# Patient Record
Sex: Female | Born: 1950 | Race: White | Hispanic: No | Smoking: Never smoker
Health system: Southern US, Community
[De-identification: ages and names within clinical notes are randomized; demographics above are authoritative.]

## PROBLEM LIST (undated history)

## (undated) DIAGNOSIS — R519 Headache, unspecified: Secondary | ICD-10-CM

## (undated) DIAGNOSIS — D126 Benign neoplasm of colon, unspecified: Secondary | ICD-10-CM

## (undated) DIAGNOSIS — M1990 Unspecified osteoarthritis, unspecified site: Secondary | ICD-10-CM

## (undated) DIAGNOSIS — N200 Calculus of kidney: Secondary | ICD-10-CM

## (undated) DIAGNOSIS — I10 Essential (primary) hypertension: Secondary | ICD-10-CM

## (undated) DIAGNOSIS — I5032 Chronic diastolic (congestive) heart failure: Secondary | ICD-10-CM

## (undated) DIAGNOSIS — T8859XA Other complications of anesthesia, initial encounter: Secondary | ICD-10-CM

## (undated) DIAGNOSIS — R112 Nausea with vomiting, unspecified: Secondary | ICD-10-CM

## (undated) DIAGNOSIS — A419 Sepsis, unspecified organism: Secondary | ICD-10-CM

## (undated) DIAGNOSIS — J9621 Acute and chronic respiratory failure with hypoxia: Secondary | ICD-10-CM

## (undated) DIAGNOSIS — R51 Headache: Secondary | ICD-10-CM

## (undated) DIAGNOSIS — G8929 Other chronic pain: Secondary | ICD-10-CM

## (undated) DIAGNOSIS — I499 Cardiac arrhythmia, unspecified: Secondary | ICD-10-CM

## (undated) DIAGNOSIS — E785 Hyperlipidemia, unspecified: Secondary | ICD-10-CM

## (undated) DIAGNOSIS — N390 Urinary tract infection, site not specified: Secondary | ICD-10-CM

## (undated) DIAGNOSIS — T4145XA Adverse effect of unspecified anesthetic, initial encounter: Secondary | ICD-10-CM

## (undated) DIAGNOSIS — IMO0002 Reserved for concepts with insufficient information to code with codable children: Secondary | ICD-10-CM

## (undated) DIAGNOSIS — K589 Irritable bowel syndrome without diarrhea: Secondary | ICD-10-CM

## (undated) DIAGNOSIS — Z9889 Other specified postprocedural states: Secondary | ICD-10-CM

## (undated) DIAGNOSIS — K219 Gastro-esophageal reflux disease without esophagitis: Secondary | ICD-10-CM

## (undated) DIAGNOSIS — G4730 Sleep apnea, unspecified: Secondary | ICD-10-CM

## (undated) DIAGNOSIS — E119 Type 2 diabetes mellitus without complications: Secondary | ICD-10-CM

## (undated) DIAGNOSIS — R6520 Severe sepsis without septic shock: Secondary | ICD-10-CM

## (undated) DIAGNOSIS — G629 Polyneuropathy, unspecified: Secondary | ICD-10-CM

## (undated) DIAGNOSIS — U071 COVID-19: Secondary | ICD-10-CM

## (undated) DIAGNOSIS — K52832 Lymphocytic colitis: Secondary | ICD-10-CM

## (undated) DIAGNOSIS — K317 Polyp of stomach and duodenum: Secondary | ICD-10-CM

## (undated) DIAGNOSIS — I219 Acute myocardial infarction, unspecified: Secondary | ICD-10-CM

## (undated) HISTORY — DX: Reserved for concepts with insufficient information to code with codable children: IMO0002

## (undated) HISTORY — PX: ABDOMINAL HYSTERECTOMY: SHX81

## (undated) HISTORY — DX: Lymphocytic colitis: K52.832

## (undated) HISTORY — DX: Urinary tract infection, site not specified: N39.0

## (undated) HISTORY — DX: Headache: R51

## (undated) HISTORY — PX: ABDOMINAL HYSTERECTOMY: SUR658

## (undated) HISTORY — DX: Acute myocardial infarction, unspecified: I21.9

## (undated) HISTORY — DX: Gastro-esophageal reflux disease without esophagitis: K21.9

## (undated) HISTORY — PX: BACK SURGERY: SHX140

## (undated) HISTORY — DX: Polyp of stomach and duodenum: K31.7

## (undated) HISTORY — DX: Headache, unspecified: R51.9

## (undated) HISTORY — DX: Cardiac arrhythmia, unspecified: I49.9

## (undated) HISTORY — DX: Polyneuropathy, unspecified: G62.9

## (undated) HISTORY — DX: Essential (primary) hypertension: I10

## (undated) HISTORY — DX: Other chronic pain: G89.29

## (undated) HISTORY — DX: Sleep apnea, unspecified: G47.30

## (undated) HISTORY — DX: Type 2 diabetes mellitus without complications: E11.9

## (undated) HISTORY — DX: Unspecified osteoarthritis, unspecified site: M19.90

## (undated) HISTORY — DX: Benign neoplasm of colon, unspecified: D12.6

## (undated) HISTORY — DX: Irritable bowel syndrome, unspecified: K58.9

## (undated) HISTORY — DX: Hyperlipidemia, unspecified: E78.5

## (undated) HISTORY — DX: Calculus of kidney: N20.0

## (undated) HISTORY — PX: BLADDER REPAIR: SHX76

---

## 1998-02-04 ENCOUNTER — Ambulatory Visit (HOSPITAL_COMMUNITY): Admission: RE | Admit: 1998-02-04 | Discharge: 1998-02-04 | Payer: Self-pay | Admitting: Family Medicine

## 1998-07-04 HISTORY — PX: CHOLECYSTECTOMY: SHX55

## 1998-08-22 ENCOUNTER — Inpatient Hospital Stay (HOSPITAL_COMMUNITY): Admission: EM | Admit: 1998-08-22 | Discharge: 1998-08-23 | Payer: Self-pay | Admitting: Emergency Medicine

## 1998-08-22 ENCOUNTER — Encounter: Payer: Self-pay | Admitting: *Deleted

## 1999-02-12 ENCOUNTER — Encounter: Payer: Self-pay | Admitting: Family Medicine

## 1999-02-12 ENCOUNTER — Ambulatory Visit (HOSPITAL_COMMUNITY): Admission: RE | Admit: 1999-02-12 | Discharge: 1999-02-12 | Payer: Self-pay | Admitting: Family Medicine

## 1999-10-29 ENCOUNTER — Encounter: Payer: Self-pay | Admitting: Family Medicine

## 1999-10-29 ENCOUNTER — Encounter: Admission: RE | Admit: 1999-10-29 | Discharge: 1999-10-29 | Payer: Self-pay | Admitting: Family Medicine

## 1999-12-23 ENCOUNTER — Encounter: Admission: RE | Admit: 1999-12-23 | Discharge: 1999-12-23 | Payer: Self-pay | Admitting: Family Medicine

## 1999-12-23 ENCOUNTER — Encounter: Payer: Self-pay | Admitting: Family Medicine

## 2000-02-14 ENCOUNTER — Ambulatory Visit (HOSPITAL_COMMUNITY): Admission: RE | Admit: 2000-02-14 | Discharge: 2000-02-14 | Payer: Self-pay | Admitting: Family Medicine

## 2000-02-14 ENCOUNTER — Encounter: Payer: Self-pay | Admitting: Family Medicine

## 2001-05-07 ENCOUNTER — Encounter: Admission: RE | Admit: 2001-05-07 | Discharge: 2001-05-07 | Payer: Self-pay | Admitting: Family Medicine

## 2001-05-07 ENCOUNTER — Encounter: Payer: Self-pay | Admitting: Family Medicine

## 2001-10-01 ENCOUNTER — Encounter: Payer: Self-pay | Admitting: Family Medicine

## 2001-10-01 ENCOUNTER — Ambulatory Visit (HOSPITAL_COMMUNITY): Admission: RE | Admit: 2001-10-01 | Discharge: 2001-10-01 | Payer: Self-pay | Admitting: Family Medicine

## 2002-03-08 ENCOUNTER — Ambulatory Visit (HOSPITAL_COMMUNITY): Admission: RE | Admit: 2002-03-08 | Discharge: 2002-03-08 | Payer: Self-pay | Admitting: Family Medicine

## 2002-03-08 ENCOUNTER — Encounter: Payer: Self-pay | Admitting: Family Medicine

## 2002-03-25 ENCOUNTER — Encounter: Payer: Self-pay | Admitting: Family Medicine

## 2002-03-25 ENCOUNTER — Encounter: Admission: RE | Admit: 2002-03-25 | Discharge: 2002-03-25 | Payer: Self-pay | Admitting: Family Medicine

## 2002-04-09 ENCOUNTER — Encounter: Payer: Self-pay | Admitting: Family Medicine

## 2002-04-09 ENCOUNTER — Encounter: Admission: RE | Admit: 2002-04-09 | Discharge: 2002-04-09 | Payer: Self-pay | Admitting: Family Medicine

## 2002-10-28 ENCOUNTER — Ambulatory Visit (HOSPITAL_COMMUNITY): Admission: RE | Admit: 2002-10-28 | Discharge: 2002-10-28 | Payer: Self-pay | Admitting: Family Medicine

## 2002-10-28 ENCOUNTER — Encounter: Payer: Self-pay | Admitting: Family Medicine

## 2003-02-28 ENCOUNTER — Ambulatory Visit (HOSPITAL_COMMUNITY): Admission: RE | Admit: 2003-02-28 | Discharge: 2003-02-28 | Payer: Self-pay | Admitting: Family Medicine

## 2003-02-28 ENCOUNTER — Encounter: Payer: Self-pay | Admitting: Family Medicine

## 2003-04-09 ENCOUNTER — Encounter: Admission: RE | Admit: 2003-04-09 | Discharge: 2003-04-09 | Payer: Self-pay | Admitting: Neurosurgery

## 2003-04-09 ENCOUNTER — Encounter: Payer: Self-pay | Admitting: Neurosurgery

## 2003-06-26 ENCOUNTER — Encounter: Admission: RE | Admit: 2003-06-26 | Discharge: 2003-09-24 | Payer: Self-pay | Admitting: Family Medicine

## 2003-09-23 ENCOUNTER — Encounter: Admission: RE | Admit: 2003-09-23 | Discharge: 2003-12-22 | Payer: Self-pay | Admitting: Family Medicine

## 2003-10-01 ENCOUNTER — Encounter: Admission: RE | Admit: 2003-10-01 | Discharge: 2003-10-01 | Payer: Self-pay | Admitting: Family Medicine

## 2003-10-20 ENCOUNTER — Encounter: Admission: RE | Admit: 2003-10-20 | Discharge: 2003-10-20 | Payer: Self-pay | Admitting: Family Medicine

## 2003-11-25 ENCOUNTER — Encounter: Admission: RE | Admit: 2003-11-25 | Discharge: 2003-11-25 | Payer: Self-pay | Admitting: Otolaryngology

## 2004-02-04 ENCOUNTER — Other Ambulatory Visit: Admission: RE | Admit: 2004-02-04 | Discharge: 2004-02-04 | Payer: Self-pay | Admitting: Obstetrics and Gynecology

## 2004-07-04 DIAGNOSIS — D126 Benign neoplasm of colon, unspecified: Secondary | ICD-10-CM

## 2004-07-04 HISTORY — DX: Benign neoplasm of colon, unspecified: D12.6

## 2004-10-22 ENCOUNTER — Ambulatory Visit: Payer: Self-pay | Admitting: Internal Medicine

## 2004-11-10 ENCOUNTER — Ambulatory Visit: Payer: Self-pay | Admitting: Internal Medicine

## 2005-03-08 ENCOUNTER — Other Ambulatory Visit: Admission: RE | Admit: 2005-03-08 | Discharge: 2005-03-08 | Payer: Self-pay | Admitting: Obstetrics and Gynecology

## 2005-06-07 ENCOUNTER — Encounter: Admission: RE | Admit: 2005-06-07 | Discharge: 2005-06-07 | Payer: Self-pay | Admitting: Family Medicine

## 2006-07-07 ENCOUNTER — Encounter: Admission: RE | Admit: 2006-07-07 | Discharge: 2006-07-07 | Payer: Self-pay | Admitting: Family Medicine

## 2007-02-05 ENCOUNTER — Emergency Department (HOSPITAL_COMMUNITY): Admission: EM | Admit: 2007-02-05 | Discharge: 2007-02-06 | Payer: Self-pay | Admitting: Emergency Medicine

## 2007-02-20 ENCOUNTER — Encounter: Admission: RE | Admit: 2007-02-20 | Discharge: 2007-02-20 | Payer: Self-pay | Admitting: Family Medicine

## 2007-07-16 ENCOUNTER — Encounter: Admission: RE | Admit: 2007-07-16 | Discharge: 2007-07-16 | Payer: Self-pay | Admitting: Dermatology

## 2008-04-03 HISTORY — PX: CARDIAC CATHETERIZATION: SHX172

## 2008-04-07 ENCOUNTER — Encounter: Admission: RE | Admit: 2008-04-07 | Discharge: 2008-04-28 | Payer: Self-pay | Admitting: Internal Medicine

## 2008-04-19 ENCOUNTER — Inpatient Hospital Stay (HOSPITAL_COMMUNITY): Admission: EM | Admit: 2008-04-19 | Discharge: 2008-04-22 | Payer: Self-pay | Admitting: Emergency Medicine

## 2008-09-08 ENCOUNTER — Encounter: Admission: RE | Admit: 2008-09-08 | Discharge: 2008-09-29 | Payer: Self-pay | Admitting: Neurology

## 2008-11-05 ENCOUNTER — Emergency Department (HOSPITAL_COMMUNITY): Admission: EM | Admit: 2008-11-05 | Discharge: 2008-11-05 | Payer: Self-pay | Admitting: Emergency Medicine

## 2008-11-19 ENCOUNTER — Encounter: Admission: RE | Admit: 2008-11-19 | Discharge: 2008-11-19 | Payer: Self-pay | Admitting: Neurology

## 2009-01-28 ENCOUNTER — Emergency Department (HOSPITAL_COMMUNITY): Admission: EM | Admit: 2009-01-28 | Discharge: 2009-01-28 | Payer: Self-pay | Admitting: Emergency Medicine

## 2009-06-14 IMAGING — CR DG CHEST 2V
2 series · 2 of 2 positions shown · non-contrast
Comparison: 07/16/2007

CLINICAL DATA: Chest pain

CHEST - 2 VIEW

[w chest pa]
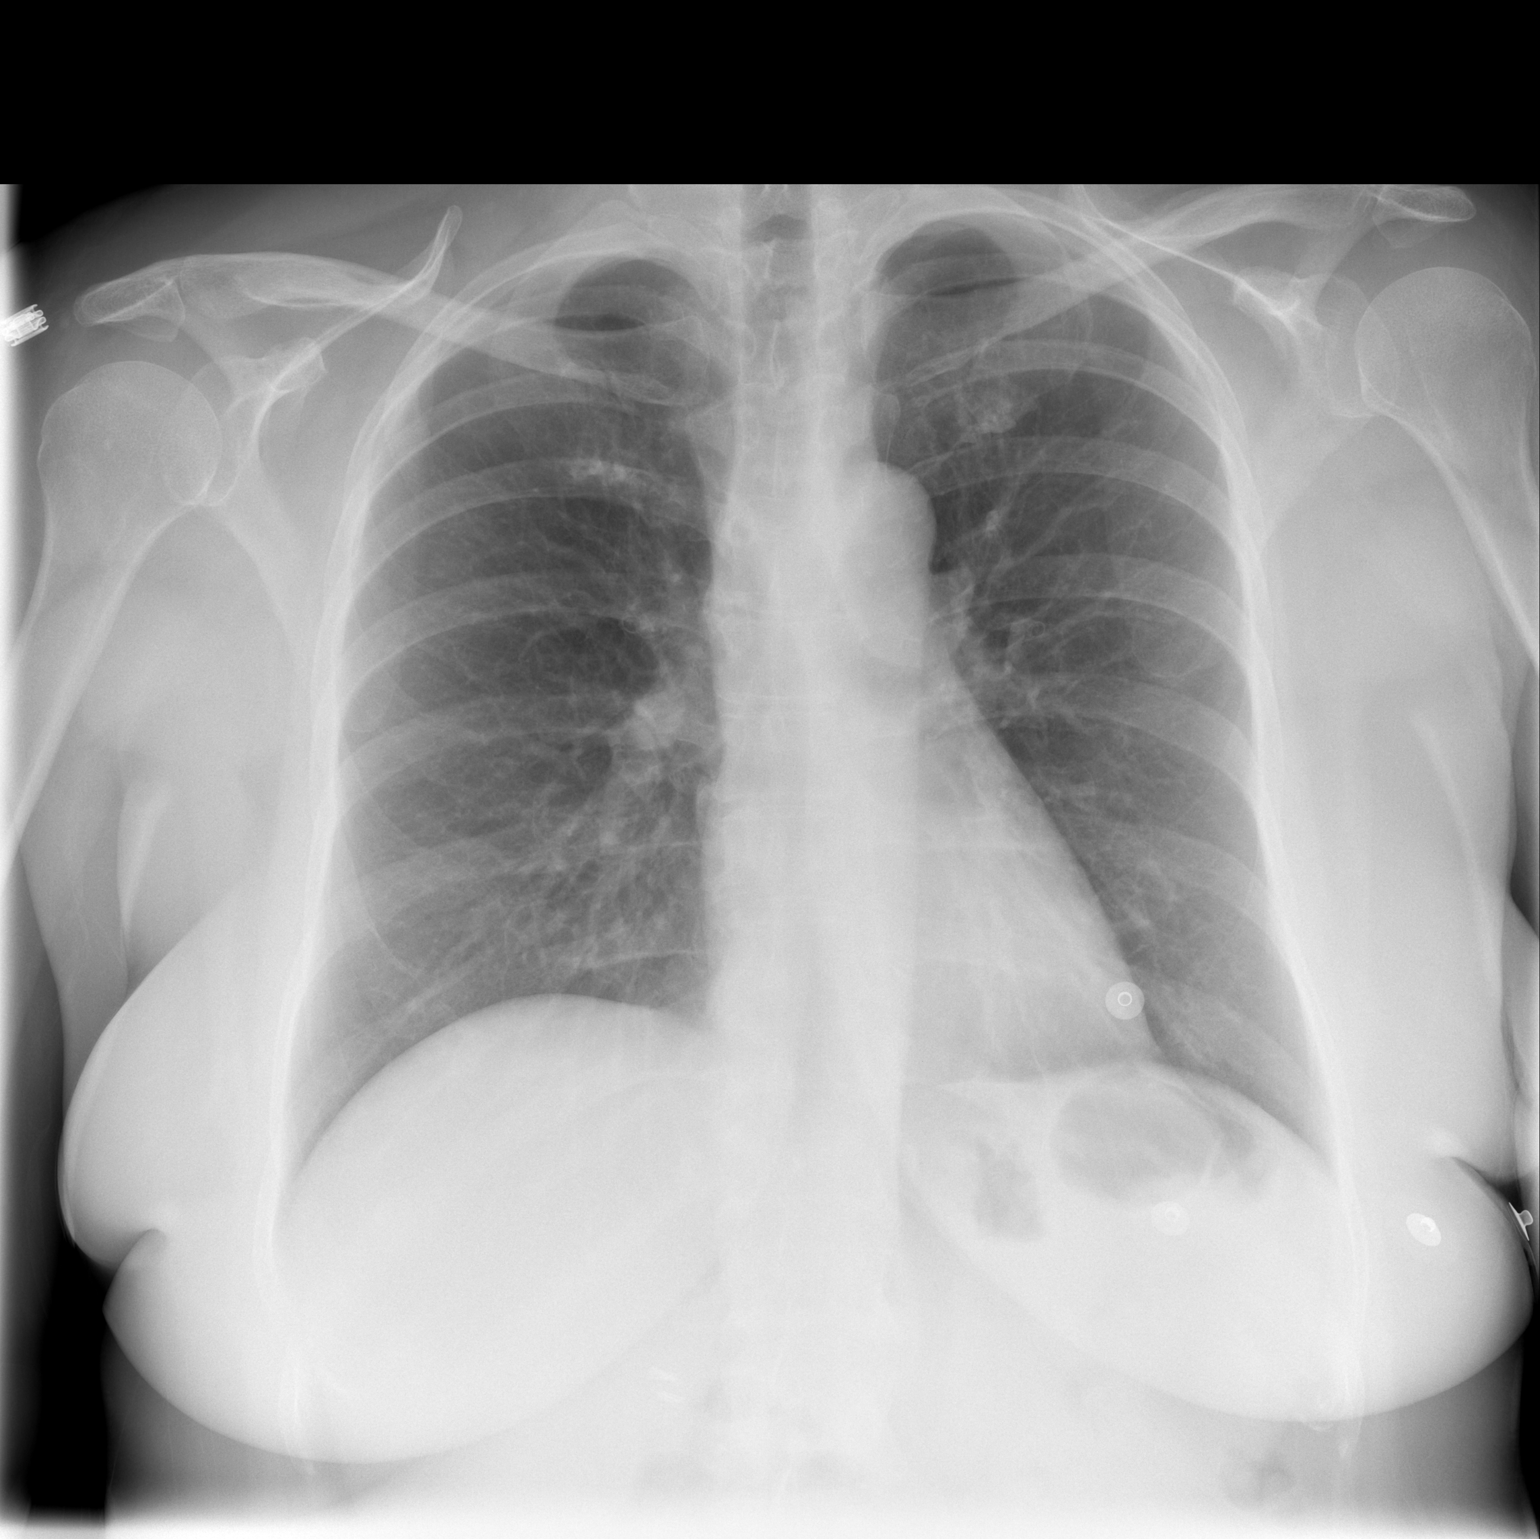

[w chest lat]
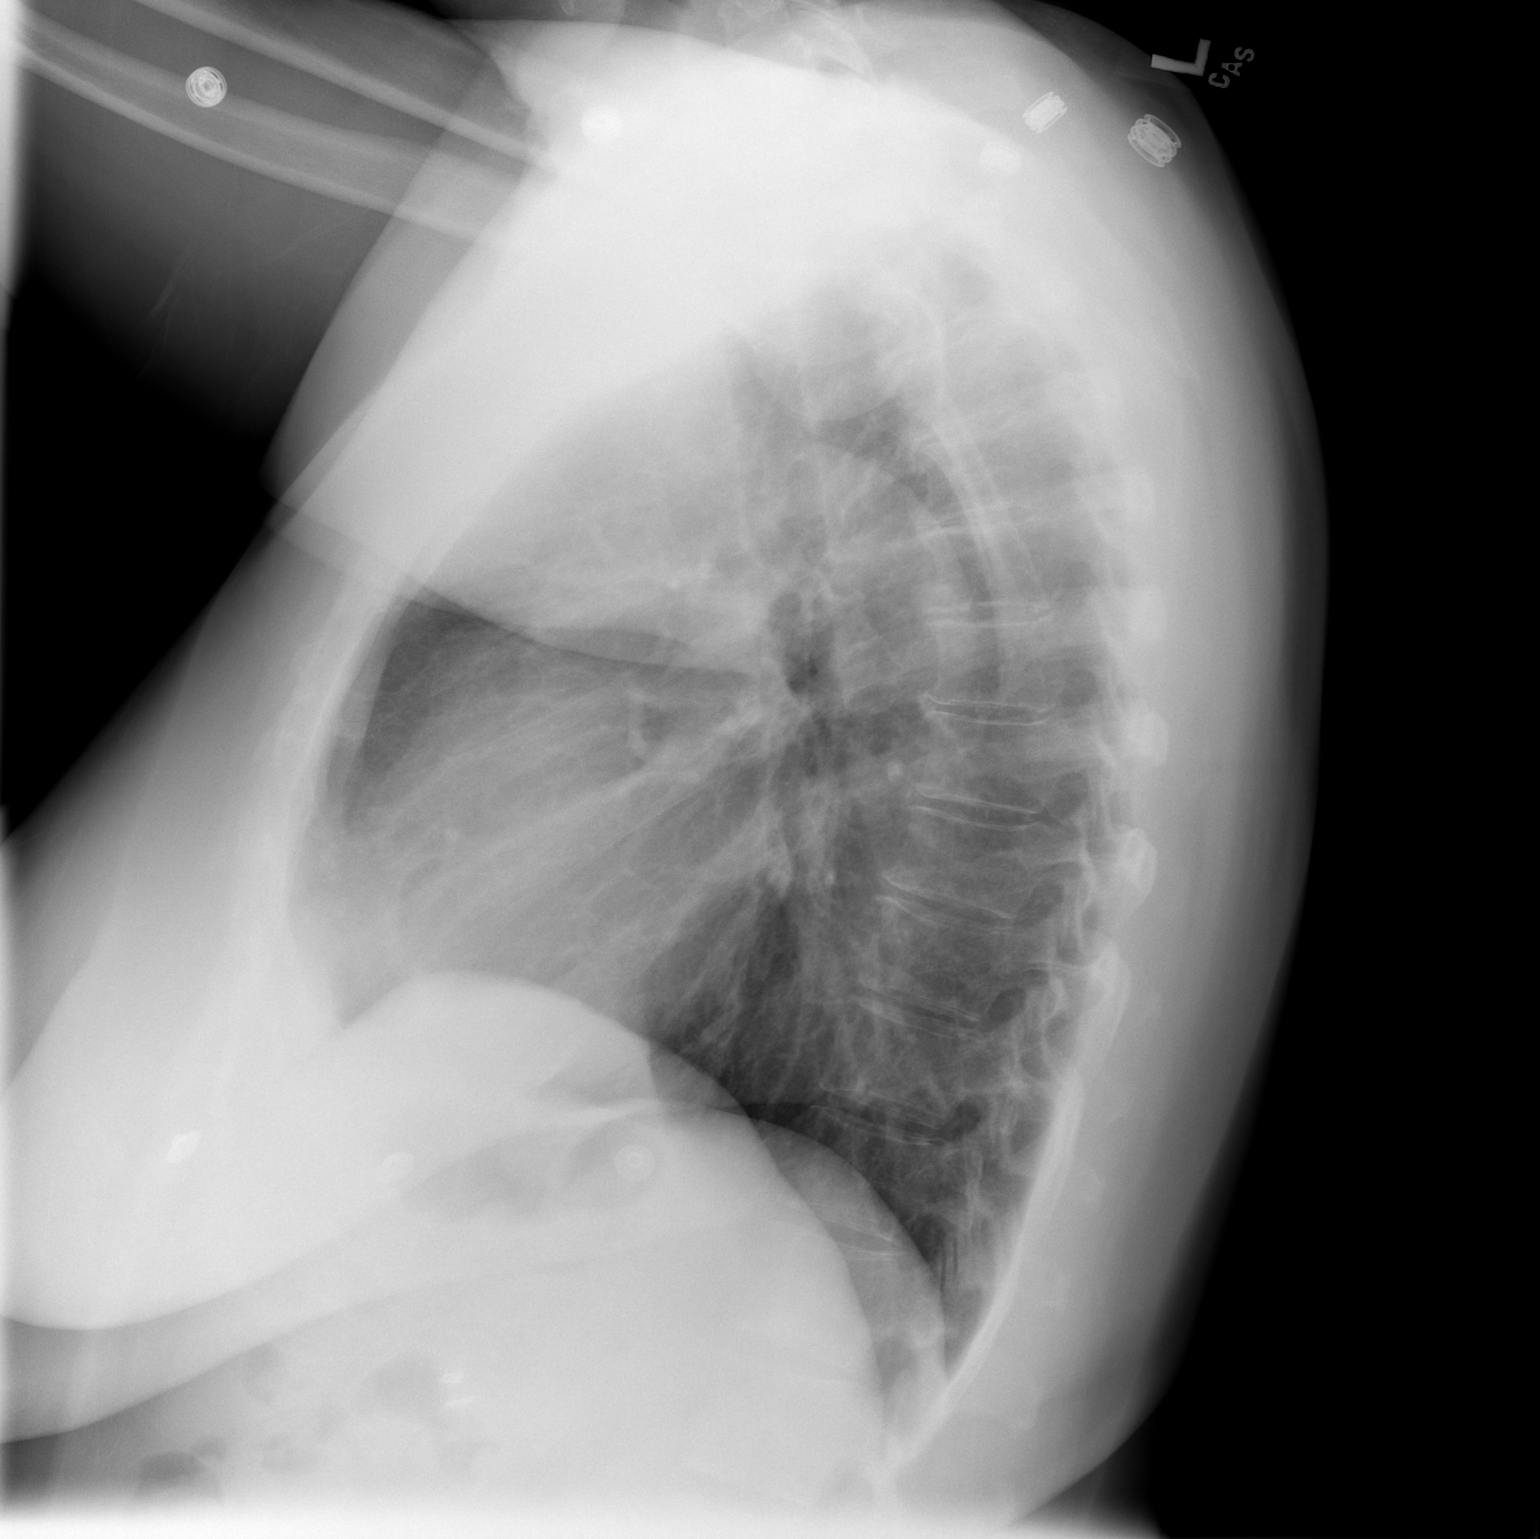

[2 of 2 positions shown; findings below may reference images not displayed]

FINDINGS: The cardiomediastinal silhouette is unremarkable.
The lungs are clear.
There is no evidence of focal airspace disease, pleural effusions,
or pneumothorax.
No acute bony abnormalities are identified.
IMPRESSION: No evidence of acute cardiopulmonary disease.

## 2009-08-30 ENCOUNTER — Ambulatory Visit (HOSPITAL_COMMUNITY): Admission: RE | Admit: 2009-08-30 | Discharge: 2009-08-30 | Payer: Self-pay | Admitting: Internal Medicine

## 2009-10-13 ENCOUNTER — Encounter (INDEPENDENT_AMBULATORY_CARE_PROVIDER_SITE_OTHER): Payer: Self-pay | Admitting: *Deleted

## 2010-04-13 ENCOUNTER — Ambulatory Visit (HOSPITAL_COMMUNITY): Admission: RE | Admit: 2010-04-13 | Discharge: 2010-04-13 | Payer: Self-pay | Admitting: Internal Medicine

## 2010-08-03 NOTE — Letter (Signed)
Summary: Colonoscopy Letter  [facility]  [address] [place], [place] [address]   Phone: [phone]  Fax: [phone]      October 13, 2009 MRN: [id number]   [facility] [address] [place], [place]  [address]   Dear [name],   According to your medical record, it is time for you to schedule a Colonoscopy. The American Cancer Society recommends this procedure as a method to detect early colon cancer. Patients with a family history of colon cancer, or a personal history of colon polyps or inflammatory bowel disease are at increased risk.  This letter has beeen generated based on the recommendations made at the time of your procedure. If you feel that in your particular situation this may no longer apply, please contact our office.  Please call our office at [phone] to schedule this appointment or to update your records at your earliest convenience.  Thank you for cooperating with [place] to provide you with the very best care possible.   Sincerely,  Hedwig Morton. Juanda Chance, M.D  [facility] [phone]

## 2010-08-11 ENCOUNTER — Emergency Department (HOSPITAL_COMMUNITY): Payer: MEDICARE

## 2010-08-11 ENCOUNTER — Observation Stay (HOSPITAL_COMMUNITY)
Admission: EM | Admit: 2010-08-11 | Discharge: 2010-08-12 | Disposition: A | Discharge status: Home / Self Care | Payer: MEDICARE | Attending: Internal Medicine | Admitting: Internal Medicine

## 2010-08-11 DIAGNOSIS — G609 Hereditary and idiopathic neuropathy, unspecified: Secondary | ICD-10-CM | POA: Insufficient documentation

## 2010-08-11 DIAGNOSIS — R51 Headache: Secondary | ICD-10-CM | POA: Insufficient documentation

## 2010-08-11 DIAGNOSIS — E119 Type 2 diabetes mellitus without complications: Secondary | ICD-10-CM | POA: Insufficient documentation

## 2010-08-11 DIAGNOSIS — I10 Essential (primary) hypertension: Secondary | ICD-10-CM | POA: Insufficient documentation

## 2010-08-11 DIAGNOSIS — K219 Gastro-esophageal reflux disease without esophagitis: Secondary | ICD-10-CM | POA: Insufficient documentation

## 2010-08-11 DIAGNOSIS — H538 Other visual disturbances: Secondary | ICD-10-CM | POA: Insufficient documentation

## 2010-08-11 DIAGNOSIS — R079 Chest pain, unspecified: Principal | ICD-10-CM | POA: Insufficient documentation

## 2010-08-11 DIAGNOSIS — E785 Hyperlipidemia, unspecified: Secondary | ICD-10-CM | POA: Insufficient documentation

## 2010-08-11 DIAGNOSIS — E860 Dehydration: Secondary | ICD-10-CM | POA: Insufficient documentation

## 2010-08-11 DIAGNOSIS — R42 Dizziness and giddiness: Secondary | ICD-10-CM | POA: Insufficient documentation

## 2010-08-11 LAB — DIFFERENTIAL
Eosinophils Absolute: 0.2 10*3/uL (ref 0.0–0.7)
Eosinophils Relative: 2 % (ref 0–5)
Lymphs Abs: 2.3 10*3/uL (ref 0.7–4.0)
Monocytes Relative: 6 % (ref 3–12)

## 2010-08-11 LAB — POCT CARDIAC MARKERS
CKMB, poc: <1 ng/mL — ABNORMAL LOW (ref 1.0–8.0)
Troponin i, poc: <0.05 ng/mL (ref 0.00–0.09)

## 2010-08-11 LAB — POCT I-STAT, CHEM 8
Chloride: 108 mEq/L (ref 96–112)
Creatinine, Ser: 0.8 mg/dL (ref 0.4–1.2)
Hemoglobin: 15 g/dL (ref 12.0–15.0)
Potassium: 3.6 mEq/L (ref 3.5–5.1)
Sodium: 142 mEq/L (ref 135–145)

## 2010-08-11 LAB — CBC
HCT: 42.6 % (ref 36.0–46.0)
MCH: 28.8 pg (ref 26.0–34.0)
MCHC: 34 g/dL (ref 30.0–36.0)
MCV: 84.5 fL (ref 78.0–100.0)
RDW: 14.4 % (ref 11.5–15.5)

## 2010-08-11 LAB — PROTIME-INR: INR: 0.97 (ref 0.00–1.49)

## 2010-08-11 LAB — GLUCOSE, CAPILLARY: Glucose-Capillary: 137 mg/dL — ABNORMAL HIGH (ref 70–99)

## 2010-08-12 LAB — CARDIAC PANEL(CRET KIN+CKTOT+MB+TROPI)
CK, MB: 0.9 ng/mL (ref 0.3–4.0)
Relative Index: INVALID (ref 0.0–2.5)
Relative Index: INVALID (ref 0.0–2.5)
Troponin I: <0.01 ng/mL (ref 0.00–0.06)
Troponin I: 0.01 ng/mL (ref 0.00–0.06)

## 2010-08-12 LAB — BASIC METABOLIC PANEL
BUN: 17 mg/dL (ref 6–23)
CO2: 25 mEq/L (ref 19–32)
Calcium: 8.9 mg/dL (ref 8.4–10.5)
Chloride: 111 mEq/L (ref 96–112)
Creatinine, Ser: 0.68 mg/dL (ref 0.4–1.2)
GFR calc Af Amer: >60 mL/min (ref >60)

## 2010-08-12 LAB — GLUCOSE, CAPILLARY: Glucose-Capillary: 88 mg/dL (ref 70–99)

## 2010-08-15 NOTE — Discharge Summary (Signed)
[name], [name]               ACCOUNT NO.:  [id number]  MEDICAL RECORD NO.:  [id number]           PATIENT TYPE:  I  LOCATION:  3701                         FACILITY:  [facility]  PHYSICIAN:  Andreas Blower, MD       DATE OF BIRTH:  Apr 10, 1951  DATE OF ADMISSION:  08/11/2010 DATE OF DISCHARGE:  08/12/2010                              DISCHARGE SUMMARY   PATIENT'S PRIMARY CARE PHYSICIAN:  At [facility].  PATIENT'S CARDIOLOGIST:  Sharrie Rothman, MD, PhD at [facility].  PATIENT'S NEUROLOGIST:  Melvyn Novas, MD  DISCHARGE DIAGNOSES: 1. Chest pain most likely secondary to musculoskeletal on chest pain     in nature. 2. Dizziness, etiology unclear maybe due to benign paroxysmal     positional vertigo. 3. Recurrent episodes of blurry vision, maybe due to syncope versus     presyncope versus questionable migraines. 4. Hypertension. 5. Hyperlipidemia. 6. Peripheral neuropathy. 7. Significant history of diabetes diet controlled. 8. Status post low back surgery for degenerative joint disease. 9. Status post cholecystectomy. 10.Status post 2 bladder tack surgery, status post hysterectomy. 11.Gastroesophageal reflux disease.  DISCHARGE MEDICATIONS: 1. Omeprazole 20 mg p.o. twice daily. 2. Aspirin 81 mg p.o. daily. 3. __________ 25 mg daily at bedtime. 4. Rosuvastatin 10 mg p.o. daily at bedtime. 5. Fish oil 1 g 2 capsules p.o. q.a.m. 6. Gabapentin 200 mg p.o. 3 times a day. 7. Hydrocodone/APAP 100/325 one tablet p.o. 3 times a day as needed. 8. Multivitamin 1 tablet p.o. q.a.m. 9. Vitamin D3 over-the-counter 1 tablet p.o. daily. 10.Xanax 1 mg p.o. daily at bedtime.  BRIEF ADMITTING HISTORY AND PHYSICAL:  [name] is a 60 year old Caucasian female who presented with a chest discomfort and questionable syncopal episode yesterday.  RADIOLOGY/IMAGING: 1. The patient had portable chest x-ray which shows no acute     cardiopulmonary findings. 2. The patient  had a CT of the head and C-spine which shows no acute     intracranial abnormality or significant interval change. 3. Minimal sphenoid sinus disease.  Postoperative changes of the     sinus.  No acute abnormality of the cervical spine.  Focal left-     sided facet degenerative changes at C5 through C6.  LABORATORY DATA:  CBC shows a white count of 9.5, hemoglobin 15.0, hematocrit 44.0, platelet count 203.  Electrolytes normal with a creatinine of 0.68.  Troponin's negative x3.  HOSPITAL COURSE: 1. Chest discomfort most likely due to musculoskeletal pain.  Reports     that she has been doing some light weight lifting and has had noted     chest pain since then.  Her pain is reproducible on palpation on     her chest. 2. Questionable syncopal episode.  The patient was not orthostatic on     admission.  She appeared adequately hydrated.  The patient does     complain about dizziness, reports that she gets dizzy whenever she     turns her head, uncertain if she has BPPV that could be     contributing to her symptoms.  I offered the patient to get a 2-D  echocardiogram while the patient was inpatient.  However, the     patient declined and says that she will follow up with her     cardiologist to get a 2-D echocardiogram.  The patient did have a 2-     D echocardiogram done on April 13, 2010, which showed her     systolic function was normal and wall motion was normal at that     time.  The patient has had history of BPPV and has had outpatient     physical therapy in the past which have not been helpful.  As a     result, we will give her a self-treatment exercises for BPPV and     instructed her to follow up with her primary care physician. 3. Recurrent headaches, maybe due to migraine headache.  Resolved     during the course of the hospital stay. 4. Hypertension.  The patient's blood pressure has been borderline low     at 104/69.  As a result, her lisinopril dose was decreased as  she     was instructed to follow with her primary care physician, at which     time, she can have further titration of her antihypertensives at     that time. 5. Hyperlipidemia, stable. 6. Diet-controlled diabetes, stable.  DISPOSITION AND FOLLOWUP:  The patient to follow up with her primary care physician in 1-week.  The patient was instructed not to drive at least for 6 months, but we will defer to her primary care physician. Also, will defer to her primary care physicians for management of her antihypertensive medications.  The patient was also instructed to follow up with Dr. Konrad Felix her cardiologist and to have a 2-D echocardiogram.  Time spent on discharge talking to the patient, coordinating care was 40 minutes.   Andreas Blower, MD    SR/MEDQ  D:  08/12/2010  T:  08/13/2010  Job:  [id number]  Electronically Signed by Wardell Heath Danialle Dement  on 08/13/2010 08:01:41 PM

## 2010-09-05 NOTE — H&P (Signed)
[name], [name]               ACCOUNT NO.:  [id number]  MEDICAL RECORD NO.:  [id number]           PATIENT TYPE:  E  LOCATION:  [facility]                         FACILITY:  [facility]  PHYSICIAN:  Vania Rea, M.D. DATE OF BIRTH:  27-May-1951  DATE OF ADMISSION:  08/11/2010 DATE OF DISCHARGE:                             HISTORY & PHYSICAL   PRIMARY CARE PHYSICIAN:  [facility].  CARDIOLOGIST:  Dr. Elwin Mocha at [facility].  NEUROLOGIST:  Melvyn Novas, MD  CHIEF COMPLAINT:  Chest pain and syncopal episode.  HISTORY OF PRESENT ILLNESS:  This is a 60 year old Caucasian lady who presents with a history of substernal heaviness and discomfort, which started yesterday, worse today and was associated with syncopal episode today.  This history was sometimes varied and was sometimes unclear and the patient says she sometimes gets confused and her memory is not quite right when she is talking to somebody, but when she is by herself she remembers things more easily.  The patient initially gave a history of having previously had myocardial infarctions and that she has prescribed sublingual nitroglycerin, but she takes when necessary for chest pain. She however says nitroglycerin does not relieve the chest pain.  When it was pointed out to her that the review of her records reveal that she had cardiac catheterization in 2009, it showed no evidence of an acute myocardial infarction, nor any evidence of coronary artery disease.  The patient agreed that she did not have an acute MI at that time.  The patient does however, says she was plagued with chest pain, she further says she was in the area visiting with family and then because of the chest pain, she decided to drive herself to the Emergency Room to be evaluated.  She later however, said the pain actually started yesterday and that today, the chest pain did not restart until after she left her family members  house and was on her way home.  She says as she approached a stop light, she had a sensation that she cannot describe that made her know that she was about to pass out because she has had these spells before.  She pulled over by the side of the road of a stop light and does not remember anything until the EMS arrived and was taking out of the car.  She says she refused to have herself transported by EMS because she was not going to pay them the 500 dollars and by this time, also her sister had driven up behind her.  She insisted on driving herself to the [facility] Emergency Room and she presented to the [facility] Emergency Room having driven herself here.  She believes her loss of consciousness was only for a few minutes.  It was not associated with any palpitations or any evidence of jerking movements that she is able to know.  No biting of her tongue and no incontinence of urine.  She reports that the chest pain when it is there is a 10 out of 10 and that there are no aggravating factors.  She reports that when she is  sitting up, she feels dizzy and when she is having the chest pain, it also makes her feel dizzy.  It is also associated with sweating and nausea.  No vomiting.  No diarrhea.  It is of note that this patient drives of course in the sitting position.  Her husband reports that she has been having these spells very frequently of strange-feeling dizziness and then passing out.  The symptoms are associated with headache predominantly on the left side of her head and with blurred vision or flashing lights.  She says she has never discussed this with her neurologist.  The pain also radiates up to both shoulders, but predominantly the left shoulder down into the left hand, up into her neck, and into her head and gives a steady headache. She reports that, although this is very frequent, she has never discussed it in detail with her primary care physician.  The patient says she  has been diagnosed with diabetes, but does not take any medication.  Her hemoglobin A1c is usually 5.5.  She says she has lost weight.  PAST MEDICAL HISTORY: 1. Hypertension. 2. Diabetes controlled on diet. 3. Hyperlipidemia, recurrent problems as noted above. 4. She is status post lower back surgery for degenerative joint     disease. 5. She is status post cholecystectomy. 6. She is status post two bladder tuck status post hysterectomy. 7. Also has a history of GERD.  MEDICATIONS: 1. Omeprazole 20 mg daily. 2. Vitamin D3 over-the-counter 1 daily. 3. Fish oil daily. 4. Multivitamins daily. 5. Aspirin 81 mg daily. 6. Lisinopril 10 mg daily. 7. Gabapentin 300 mg twice daily. 8. Crestor 10 mg at bedtime. 9. Xanax 1 mg daily when necessary.  Says she rarely takes it. 10.Hydrocodone 10/325 one tablet 3 times a day when necessary for     pain.  Says she rarely takes it. 11.Atenolol 25 mg at bedtime.  ALLERGIES:  SHELLFISH, PENICILLIN, and SULFA all of which cause swelling.  SOCIAL HISTORY:  She denies any tobacco, alcohol, or illicit drug use. She is on disability because of her back issues.  She is married and her husband is present with her on interview.  FAMILY HISTORY:  She has no family history of coronary artery disease, but she says both parents and all her siblings have hypertension.  No family history of diabetes.  REVIEW OF SYSTEMS:  Other than noted above, significant only for a history of shingles and a history of neuropathy in her left foot, which she says as an undiagnosed etiology.  Additionally, she reports that she does not sleep very well.  She wakes up frequently about every hour at bedtime.  Does not find herself thinking about anything in particular.  PHYSICAL EXAMINATION:  GENERAL:  Pleasant elderly Caucasian lady lying in the stretcher in no acute physical distress noted. VITAL SIGNS:  Temperature is 98.3, pulse 69, respirations 19, blood pressure  93/43, and she is saturating 100% on room air. HEENT:  Her pupils are round, equal, and reactive.  Mucous membranes pink.  Anicteric.  She does not appear to be dehydrated. NECK:  No cervical lymphadenopathy or thyromegaly.  She does have a thick neck.  No carotid bruit. CHEST:  Clear to auscultation bilaterally. CARDIOVASCULAR:  Regular rhythm.  No murmur. ABDOMEN:  Massively obese.  Soft.  Nontender.  She has no rash in the intertriginous folds. EXTREMITIES:  Calves well developed.  No edema.  No appreciated arthritic deformities. CENTRAL NERVOUS SYSTEM:  Cranial nerves II through XII  are grossly intact.  No lateralizing signs noted.  LABORATORY DATA:  Her white count is 9.5, hemoglobin 14.5, and platelets 203.  She has a normal differential.  Sodium is 142, potassium 3.6, chloride 108, glucose 109, BUN 20, and creatinine 0.8.  Cardiac markers are completely normal with undetectable CK-MB and troponin and myoglobin of 51.  Her INR is 0.97.  Her PT is 13.  Her PTT is 26.  IMAGING DATA:  Chest x-ray shows no acute cardiopulmonary findings.  Her EKG has been reported as showing normal sinus rhythm with no ST-segment elevation.  ASSESSMENT: 1. Recurrent episodes of chest discomfort associated with shoulder and     head pains of unclear etiology. 2. Recurrent headaches, blurred vision, and syncope versus presyncopal     episodes, questionable migraine, questionable complex partial     seizures. 3. Hypertension. 4. Hyperlipidemia. 5. Peripheral neuropathy. 6. Dehydration as evidenced by elevated BUN and creatinine ratio. 7. No evidence of orthostasis.  PLAN:  We will admit this lady for gentle hydration and serial cardiac enzymes.  Because of her symptoms, we will go ahead and get CT scan of her head and her neck to rule out degenerative joint disease.  This lady may benefit from outpatient followup with her neurologist.  Other plans as per orders.     Vania Rea,  M.D.   ______________________________ Vania Rea, M.D.    LC/MEDQ  D:  08/11/2010  T:  08/11/2010  Job:  [id number]  cc:   Melvyn Novas, M.D. [name]  Electronically Signed by Vania Rea M.D. on 09/05/2010 10:50:52 PM

## 2010-10-10 LAB — CBC
HCT: 41 % (ref 36.0–46.0)
MCHC: 34 g/dL (ref 30.0–36.0)
MCV: 84.5 fL (ref 78.0–100.0)
Platelets: 195 10*3/uL (ref 150–400)
RDW: 13.6 % (ref 11.5–15.5)
WBC: 6.6 10*3/uL (ref 4.0–10.5)

## 2010-10-10 LAB — BASIC METABOLIC PANEL
BUN: 16 mg/dL (ref 6–23)
CO2: 28 mEq/L (ref 19–32)
Chloride: 106 mEq/L (ref 96–112)
Creatinine, Ser: 0.57 mg/dL (ref 0.4–1.2)
Glucose, Bld: 120 mg/dL — ABNORMAL HIGH (ref 70–99)
Potassium: 4.1 mEq/L (ref 3.5–5.1)

## 2010-10-10 LAB — POCT CARDIAC MARKERS: Troponin i, poc: <0.05 ng/mL (ref 0.00–0.09)

## 2010-10-12 LAB — GLUCOSE, CAPILLARY: Glucose-Capillary: 132 mg/dL — ABNORMAL HIGH (ref 70–99)

## 2010-11-16 NOTE — Cardiovascular Report (Signed)
NAMEAAHANA, ELZA               ACCOUNT NO.:  [id number]   MEDICAL RECORD NO.:  [id number]         PATIENT TYPE:  CINP   LOCATION:                               FACILITY:  [facility]   PHYSICIAN:  Nicki Guadalajara, M.D.     DATE OF BIRTH:  May 28, 1951   DATE OF PROCEDURE:  DATE OF DISCHARGE:                            CARDIAC CATHETERIZATION   INDICATIONS:  [name] is a 60 year old patient of Dr. Duaine Dredge  and recently had a stress study done by Dr. Shelva Majestic.  This showed a  small area of apical and inferolateral scar with suggestion of mild peri-  infarction ischemia and ejection fraction of 48%.  The patient has been  experiencing some episodes of chest pain, as well as some sharp,  stabbing interscapular back discomfort, somewhat atypical.  She was  admitted to [facility] on April 19, 2008.  Cardiac enzymes  have been negative.  Definitive cardiac catheterization was recommended.   PROCEDURE:  After premedication with Versed 2 mg intravenously, the  patient was prepped and draped in the usual fashion.  Her right femoral  artery was punctured anteriorly, and a 5-French sheath was inserted  without difficulty.  Diagnostic catheterization was done utilizing 5-  [place] Judkins 4 left and right coronary catheters.  A 5-French pigtail  catheter was used for biplane cine left ventriculography, as well as  distal aortography.  Hemostasis was obtained by direct manual pressure.   HEMODYNAMIC DATA:  Central aortic pressure was 148/92.  Left ventricular  pressure 148/18.   ANGIOGRAPHIC DATA:  Left main coronary artery was angiographically  normal and bifurcated into an LAD and left circumflex system.   The LAD gave rise to 2 diagonal vessels, several septal perforating  arteries, and wrapped around the LV apex.  There was smooth 20-30%  narrowing in the LAD in the region of the first diagonal takeoff.   The circumflex vessel was moderate-sized vessel that gave rise to 1  major marginal vessel and was free of significant disease.   The right coronary artery was moderate-sized vessel and had mild 20%  narrowing after an acute marginal branch prior to the crux.   Biplane cine left ventriculography revealed normal LV contractility.  However, there was a small focal area of distal inferior  hypocontractility on the RAO projection and on the LAO projection, there  was a very focal area of apical inferolateral hypocontractility, which  appeared like a nubbin from the apical inferolateral segment.   The overall ejection fraction was 50-55%.   Distal aortography demonstrated widely patent renal arteries  bilaterally.  There was no evidence for any aortic aneurysm.  The iliacs  were not optimally visualized.   IMPRESSION:  1. Low normal left ventricular contractility with focal area of      hypocontractility involving the distal apical inferior wall on the      right anterior oblique projection and involving the apical      inferolateral wall on the left anterior oblique projection.  2. Mild coronary obstructive disease with smooth 20-30% narrowing in      the left  anterior descending in the region of the first diagonal      takeoff, as well as mild smooth 20% narrowing in the right coronary      artery.   RECOMMENDATIONS:  Medical therapy:  The patient does have a wall motion  abnormality, which does raise the possibility of a prior vasospasm  contributing to this defect.  This defect is consistent with the  abnormality seen on a nuclear stress test.  Medical therapy will be  recommended.           ______________________________  Nicki Guadalajara, M.D.     TK/MEDQ  D:  04/21/2008  T:  04/22/2008  Job:  [id number]   cc:   Mosetta Putt, M.D.  Macarthur Critchley Shelva Majestic, M.D.

## 2010-11-19 NOTE — Discharge Summary (Signed)
[name], [name]               ACCOUNT NO.:  [id number]   MEDICAL RECORD NO.:  [id number]          PATIENT TYPE:  INP   LOCATION:  3731                         FACILITY:  [facility]   PHYSICIAN:  Dani Gobble, MD       DATE OF BIRTH:  Oct 25, 1950   DATE OF ADMISSION:  04/18/2008  DATE OF DISCHARGE:  04/22/2008                               DISCHARGE SUMMARY   DISCHARGE DIAGNOSES:  1. Chest pain worrisome for unstable angina, myocardial infarction      ruled out, catheterization revealing minor coronary artery disease,      September 20, 2007.  2. Abnormal Myoview with a question of inferolateral scar.  3. Dye allergy.  4. Non-insulin-dependent diabetes.  5. Treated hypertension.  6. Treated dyslipidemia.   HOSPITAL COURSE:  The patient is a 60 year old female who presents to  [facility] Emergency Room with chest pain.  She had seen Dr. Shelva Majestic for a  Myoview on April 08, 2008, which showed an EF of 48% with small apical  scar and possible inferolateral wall ischemia.  The patient had been  recommended to see Dr. Elsie Lincoln for catheterization and further  evaluation, but before this appointment could be made, she presented to  the emergency room with chest pain.  Initial enzymes were negative.  She  was admitted to telemetry.  She was premedicated for history of  shellfish allergy.  Catheterization done on September 20, 2007, by Dr. Tresa Endo  revealed minor disease with 30% LAD at the takeoff of the first diagonal  and 20% RCA with good LV function, normal renal arteries, normal iliacs.  There was a small apical wall motion abnormality.  Dr. Tresa Endo spoke with  Dr. Shelva Majestic.  There was some concern of possibility of spasm.  She was  put on Norvasc.  Dr. Tresa Endo felt she can be discharged on April 22, 2008.  I should note that the patient was put on Lovenox on admission  for unstable angina.   DISCHARGE MEDICATIONS:  1. Lisinopril 20 mg a day.  2. Atenolol 25 mg a day.  3. Simvastatin 80 mg a day.  4. Prilosec 20 mg a day.  5. Nitroglycerin sublingual p.r.n.  6. Aspirin 81 mg a day.  7. Norvasc 5 mg a day.  8. She is also on metformin 500 mg a day and she will hold this until      April 24, 2008.   Last EKG shows sinus rhythm with T wave inversion in lead III and aVF.  White count at discharge 12.6, hemoglobin 14.4, hematocrit 43.2,  platelets 215, INR is normal 1.0.  Sodium 140, potassium 3.4, BUN 10,  creatinine 0.6.  Liver functions are normal except for a mildly elevated  AST at 39.  CK-MB and troponins are normal.  Cholesterol is 135, HDL 40,  LDL 83, TSH 1.49.   DISPOSITION:  The patient is discharged in stable condition and will  follow up with Dr. Shelva Majestic as an outpatient.      Abelino Derrick, P.A.    ______________________________  Dani Gobble, MD    LKK/MEDQ  D:  06/19/2008  T:  06/20/2008  Job:  [id number]   cc:   Macarthur Critchley. Shelva Majestic, M.D.

## 2010-12-08 ENCOUNTER — Encounter: Payer: Self-pay | Admitting: Internal Medicine

## 2010-12-21 ENCOUNTER — Telehealth: Payer: Self-pay | Admitting: Internal Medicine

## 2010-12-21 NOTE — Telephone Encounter (Signed)
Pt with hx of COLON with Dr Juanda Chance 11/10/2004 with multiple polyps-could not find path. Pt with 2 MRNS REFERRED TO ASSIST: [id number].  Pt reports diarrhea since 11/10/10. At first it was 4-5 x daily, now 20 x day that's mainly water with cramping. She also reports a lot of mucus. She reports Imodium not helping. Pt given an appt with Willette Cluster , NP on 12/27/10 at 10:30am.

## 2010-12-27 ENCOUNTER — Encounter: Payer: Self-pay | Admitting: Nurse Practitioner

## 2010-12-27 ENCOUNTER — Ambulatory Visit (INDEPENDENT_AMBULATORY_CARE_PROVIDER_SITE_OTHER): Payer: Medicare Other | Admitting: Nurse Practitioner

## 2010-12-27 ENCOUNTER — Encounter: Payer: Self-pay | Admitting: Internal Medicine

## 2010-12-27 ENCOUNTER — Other Ambulatory Visit (INDEPENDENT_AMBULATORY_CARE_PROVIDER_SITE_OTHER): Payer: Medicare Other

## 2010-12-27 VITALS — BP 82/60 | HR 76 | Ht 65.0 in | Wt 208.8 lb

## 2010-12-27 DIAGNOSIS — Z860100 Personal history of colon polyps, unspecified: Secondary | ICD-10-CM

## 2010-12-27 DIAGNOSIS — R1310 Dysphagia, unspecified: Secondary | ICD-10-CM | POA: Insufficient documentation

## 2010-12-27 DIAGNOSIS — K219 Gastro-esophageal reflux disease without esophagitis: Secondary | ICD-10-CM

## 2010-12-27 DIAGNOSIS — R197 Diarrhea, unspecified: Secondary | ICD-10-CM

## 2010-12-27 LAB — COMPREHENSIVE METABOLIC PANEL
ALT: 35 U/L (ref 0–35)
AST: 18 U/L (ref 0–37)
Albumin: 4.1 g/dL (ref 3.5–5.2)
Alkaline Phosphatase: 86 U/L (ref 39–117)
BUN: 29 mg/dL — ABNORMAL HIGH (ref 6–23)
Chloride: 104 mEq/L (ref 96–112)
Potassium: 4.7 mEq/L (ref 3.5–5.1)

## 2010-12-27 LAB — CBC WITH DIFFERENTIAL/PLATELET
Basophils Absolute: 0 10*3/uL (ref 0.0–0.1)
Basophils Relative: 0.4 % (ref 0.0–3.0)
Eosinophils Absolute: 0.1 10*3/uL (ref 0.0–0.7)
Lymphocytes Relative: 19.6 % (ref 12.0–46.0)
MCHC: 34 g/dL (ref 30.0–36.0)
MCV: 87.6 fl (ref 78.0–100.0)
Monocytes Absolute: 0.7 10*3/uL (ref 0.1–1.0)
Neutrophils Relative %: 71.1 % (ref 43.0–77.0)
Platelets: 256 10*3/uL (ref 150.0–400.0)
RBC: 4.75 Mil/uL (ref 3.87–5.11)
RDW: 15.6 % — ABNORMAL HIGH (ref 11.5–14.6)

## 2010-12-27 MED ORDER — PEG-KCL-NACL-NASULF-NA ASC-C 100 G PO SOLR: 1.0000 | Freq: Once | ORAL | Status: AC

## 2010-12-27 NOTE — Progress Notes (Signed)
12/27/2010 [name] [id number] August 31, 1950   HISTORY OF PRESENT ILLNESS: [name] is a 60 year old female who had a screening colonoscopy by Dr. Juanda Chance May 2008. She was found to have adenomatous polyps and it is overdue for surveillance colonoscopy. Patient comes in today for evaluation of multiple gastrointestinal complaints. She complains of diarrhea which started around May 10th. Patient is having 20+ malodorous liquid bowel movements a day, some of which are nocturnal. She has occasional pre-defecatory cramps, nothing significant. No recent antibiotics that the patient can recall. No major dietary changes. No recent travel out of the country. The only new medications which could correlate timewise with onset of diarrhea include Cymbalta, zanaflex and voltaren.  Diarrhea better on the BRAT diet over the last week. Patient tells me that her husband has seen some blood in her stool at times (patient is color blind). Weight fluctuates. No fevers.   She complains of epigastric pain, worse with meals. She has breakthrough GERD symptoms on Prilosec 20 mg daily and having intermittent solid food dysphagia. She complains of excessive belching. Patient had her gallbladder out around the year 2000.[name]   Past Medical History  Diagnosis Date  . Hypertension   . Diabetes mellitus type II, controlled     Diet  . Hyperlipidemia   . GERD (gastroesophageal reflux disease)   . Myocardial infarct   . Neuropathy, peripheral   . Asthma   . Arrhythmia     Heart  . Arthritis   . Chronic headaches   . Kidney stones   . Urinary tract infection    Past Surgical History  Procedure Date  . Back surgery   . Abdominal hysterectomy   . Bladder repair     x2  . Cholecystectomy 2000    reports that she has never smoked. She has never used smokeless tobacco. She reports that she does not drink alcohol or use illicit drugs. family history includes Coronary artery disease in an unspecified family member and  Hypertension in an unspecified family member. Allergies  Allergen Reactions  . Penicillins Swelling  . Shellfish-Derived Products Swelling  . Sulfa Antibiotics Swelling      Outpatient Encounter Prescriptions as of 12/27/2010  Medication Sig Dispense Refill  . ALPRAZolam (XANAX) 1 MG tablet Take 1 mg by mouth at bedtime as needed.        [name] aspirin 81 MG tablet Take 81 mg by mouth daily.        [name] atenolol (TENORMIN) 25 MG tablet Take 25 mg by mouth daily.        . Biotin 5000 MCG TABS Take 1 tablet by mouth daily.        . Cholecalciferol (VITAMIN D) 2000 UNITS tablet Take 2,000 Units by mouth daily.        . diclofenac (VOLTAREN) 75 MG EC tablet Take 75 mg by mouth 2 (two) times daily.        . DULoxetine (CYMBALTA) 60 MG capsule Take 60 mg by mouth daily.        . fexofenadine (ALLEGRA) 180 MG tablet Take 180 mg by mouth as needed.        . fish oil-omega-3 fatty acids 1000 MG capsule Take 2 g by mouth daily.        [name] HYDROcodone-acetaminophen (NORCO) 10-325 MG per tablet Take 1 tablet by mouth every 6 (six) hours as needed.        [name] lisinopril (PRINIVIL,ZESTRIL) 10 MG tablet Take 10 mg by mouth daily.        [name]  omeprazole (PRILOSEC) 20 MG capsule Take 20 mg by mouth daily.        . rosuvastatin (CRESTOR) 10 MG tablet Take 10 mg by mouth daily.        [name] tiZANidine (ZANAFLEX) 4 MG capsule Take 4 mg by mouth daily.        [name] DISCONTD: gabapentin (NEURONTIN) 100 MG capsule Take 200 mg by mouth 3 (three) times daily.        [name] DISCONTD: Multiple Vitamin (MULTIVITAMIN) tablet Take 1 tablet by mouth daily.           REVIEW OF SYSTEMS  : Positive back pain, fatigue, headaches, night seats, urinary incontinence, sleeping problems, pruritis, confusion. All other systems reviewed and negative except where noted in the History of Present Illness.   PHYSICAL EXAM: General: Obese white female in no acute distress Head: Normocephalic and atraumatic Eyes:  sclerae anicteric,conjunctive pink. Ears:  Normal auditory acuity Mouth: No deformity or lesions Neck: Supple, no masses.  Lungs: Clear throughout to auscultation Heart: Regular rate and rhythm; no murmurs heard Abdomen: Soft, non distended, nontender. No masses or hepatomegaly noted. Normal Bowel sounds Rectal: Light brown, heme negative stool Musculoskeletal: Symmetrical with no gross deformities  Skin: No lesions on visible extremities Extremities: No edema or deformities noted Neurological: Alert oriented x 4, grossly nonfocal Cervical Nodes:  No significant cervical adenopathy Psychological:  Alert and cooperative. Normal mood and affect  ASSESSMENT AND PLAN;

## 2010-12-27 NOTE — Patient Instructions (Signed)
Please go to the basement level to have your labs drawn.  We have scheduled the Endoscopy and Colonoscoy with Dr Lina Sar on 01-10-2011. We sent the prescription for the Moviprep to your pharmacy.

## 2010-12-28 ENCOUNTER — Other Ambulatory Visit: Payer: Medicare Other

## 2010-12-28 DIAGNOSIS — R197 Diarrhea, unspecified: Secondary | ICD-10-CM

## 2010-12-28 DIAGNOSIS — Z86010 Personal history of colonic polyps: Secondary | ICD-10-CM

## 2010-12-28 DIAGNOSIS — K219 Gastro-esophageal reflux disease without esophagitis: Secondary | ICD-10-CM

## 2010-12-28 NOTE — Assessment & Plan Note (Signed)
Several week history of watery, malodorous diarrhea. There has been a small amount of blood in her stool at times , this could be perianal irritation. Rule out infectious etiology.  rule out inflammatory bowel disease. Rule out microscopic colitis. Needs stool studies. Patient will be scheduled for a colonoscopy for her history of colon polyps, if stool studies are negative then random colon biopsies may be appropriate. We'll check basic labs today. Patient will be called with test results. Her abdominal examination is essentially benign.

## 2010-12-28 NOTE — Progress Notes (Signed)
You should note that new meds could be causing diarrhea (not bleeding unless it is hemorrhoidal)

## 2010-12-28 NOTE — Assessment & Plan Note (Addendum)
Epigastric pain, pyrosis, belching despite daily PPI. Patient does take daily Voltaren so gastritis and/or peptic ulcer disease possibly contributing to upper gastrointestinal symptoms. Patient also having intermittent solid food dysphagia. For further evaluation she will be scheduled for an EGD with probable dilation. upper endoscopy. The benefits, risks, and potential complications of EGD with possible biopsies and/or dilation were discussed with the patient and she agrees to proceed.

## 2010-12-28 NOTE — Assessment & Plan Note (Signed)
Rule out esophageal ring or stricture. She may need esophageal dilation that time EGD.

## 2010-12-29 LAB — CLOSTRIDIUM DIFFICILE BY PCR: Toxigenic C. Difficile by PCR: NOT DETECTED

## 2010-12-29 LAB — OVA AND PARASITE SCREEN: OP: NONE SEEN

## 2010-12-31 ENCOUNTER — Encounter: Payer: Self-pay | Admitting: Internal Medicine

## 2011-01-01 LAB — STOOL CULTURE

## 2011-01-03 ENCOUNTER — Telehealth: Payer: Self-pay | Admitting: Internal Medicine

## 2011-01-03 DIAGNOSIS — R799 Abnormal finding of blood chemistry, unspecified: Secondary | ICD-10-CM

## 2011-01-03 MED ORDER — METRONIDAZOLE 250 MG PO TABS: ORAL_TABLET | ORAL | Status: DC

## 2011-01-03 NOTE — Telephone Encounter (Signed)
Message copied by Daphine Deutscher on Mon Jan 03, 2011  1:59 PM ------      Message from: Meredith Pel      Created: Mon Jan 03, 2011  9:36 AM       Lactoferrin positive, other stool studies negative. Patient not home, her sister answered the phone and told me patient still have diarrhea. Will treat empirically with Flagyl 250mg  TID for 10 days. Rene Kocher, will you call the patient after lunch and let her know about Flagyl. Thanks

## 2011-01-04 ENCOUNTER — Other Ambulatory Visit (INDEPENDENT_AMBULATORY_CARE_PROVIDER_SITE_OTHER): Payer: Medicare Other

## 2011-01-04 DIAGNOSIS — R7989 Other specified abnormal findings of blood chemistry: Secondary | ICD-10-CM

## 2011-01-04 DIAGNOSIS — R799 Abnormal finding of blood chemistry, unspecified: Secondary | ICD-10-CM

## 2011-01-04 LAB — BASIC METABOLIC PANEL
BUN: 20 mg/dL (ref 6–23)
GFR: 53.8 mL/min — ABNORMAL LOW (ref >60.00)
Potassium: 3.7 mEq/L (ref 3.5–5.1)
Sodium: 141 mEq/L (ref 135–145)

## 2011-01-06 ENCOUNTER — Telehealth: Payer: Self-pay | Admitting: *Deleted

## 2011-01-06 NOTE — Telephone Encounter (Signed)
Message copied by Daphine Deutscher on Thu Jan 06, 2011 11:06 AM ------      Message from: Meredith Pel      Created: Thu Jan 06, 2011 10:35 AM       Joee Iovine, BUN now normal. Please let her know and make sure that she got the Flagyl. Thanks

## 2011-01-06 NOTE — Telephone Encounter (Signed)
Patient notified. She is taking Flagyl but is still having diarrhea. She has ECL scheduled on Monday,

## 2011-01-10 ENCOUNTER — Encounter: Payer: Self-pay | Admitting: Internal Medicine

## 2011-01-10 ENCOUNTER — Ambulatory Visit (AMBULATORY_SURGERY_CENTER): Payer: Medicare Other | Admitting: Internal Medicine

## 2011-01-10 VITALS — BP 122/72 | HR 84 | Temp 97.0°F | Resp 20 | Ht 65.0 in | Wt 208.0 lb

## 2011-01-10 DIAGNOSIS — Z860100 Personal history of colon polyps, unspecified: Secondary | ICD-10-CM

## 2011-01-10 DIAGNOSIS — R109 Unspecified abdominal pain: Secondary | ICD-10-CM

## 2011-01-10 DIAGNOSIS — R197 Diarrhea, unspecified: Secondary | ICD-10-CM

## 2011-01-10 DIAGNOSIS — R1013 Epigastric pain: Secondary | ICD-10-CM

## 2011-01-10 DIAGNOSIS — K219 Gastro-esophageal reflux disease without esophagitis: Secondary | ICD-10-CM

## 2011-01-10 DIAGNOSIS — R1319 Other dysphagia: Secondary | ICD-10-CM

## 2011-01-10 DIAGNOSIS — D131 Benign neoplasm of stomach: Secondary | ICD-10-CM

## 2011-01-10 DIAGNOSIS — K5289 Other specified noninfective gastroenteritis and colitis: Secondary | ICD-10-CM

## 2011-01-10 DIAGNOSIS — R1310 Dysphagia, unspecified: Secondary | ICD-10-CM

## 2011-01-10 DIAGNOSIS — R933 Abnormal findings on diagnostic imaging of other parts of digestive tract: Secondary | ICD-10-CM

## 2011-01-10 LAB — GLUCOSE, CAPILLARY

## 2011-01-10 MED ORDER — SODIUM CHLORIDE 0.9 % IV SOLN: 500.0000 mL | INTRAVENOUS | Status: DC

## 2011-01-10 MED ORDER — CHOLESTYRAMINE 4 G PO PACK: 1.0000 | PACK | Freq: Two times a day (BID) | ORAL | Status: DC

## 2011-01-10 NOTE — Patient Instructions (Signed)
See the picture pages for the upper endoscopy and the colonoscopy finding from today' exams.  Please follow the green and the blue discharge instructions today.  See the dilatation diet threw today.  Resume your prior medications today including the prilosec 20 mg 2 x per day.  Dr. Juanda Chance gave your sister a written prescription for Questran for the diarrhea.  She will send a letter in the mail with the biopsy results and further suggestions within 1 - 2 weeks.  Please call if questions or concerns.

## 2011-01-10 NOTE — Progress Notes (Signed)
Unsuccessful IV attempt to right hand and forearm with 24 gauge needle by K. Collier Salina, RN

## 2011-01-10 NOTE — Progress Notes (Signed)
Pt passing a large amount of gas in the recovery room. MAW  Pt had no complaints on discharge. MAW

## 2011-01-11 ENCOUNTER — Telehealth: Payer: Self-pay | Admitting: *Deleted

## 2011-01-11 DIAGNOSIS — K219 Gastro-esophageal reflux disease without esophagitis: Secondary | ICD-10-CM

## 2011-01-11 NOTE — Telephone Encounter (Signed)

## 2011-01-13 ENCOUNTER — Encounter: Payer: Self-pay | Admitting: Internal Medicine

## 2011-01-15 ENCOUNTER — Encounter: Payer: Self-pay | Admitting: Internal Medicine

## 2011-01-17 ENCOUNTER — Telehealth: Payer: Self-pay | Admitting: Internal Medicine

## 2011-01-17 ENCOUNTER — Encounter: Payer: Self-pay | Admitting: *Deleted

## 2011-01-17 ENCOUNTER — Telehealth: Payer: Self-pay | Admitting: *Deleted

## 2011-01-17 MED ORDER — BUDESONIDE 3 MG PO CP24: ORAL_CAPSULE | ORAL | Status: DC

## 2011-01-17 NOTE — Telephone Encounter (Signed)
Patient notified of results as per Dr. Juanda Chance. Rx sent to pharmacy. Scheduled patient for OV on 02/10/11 at 2:00 PM. Letter mailed

## 2011-01-17 NOTE — Telephone Encounter (Signed)
Message copied by Daphine Deutscher on Mon Jan 17, 2011  9:35 AM ------      Message from: Hart Carwin      Created: Sat Jan 15, 2011  8:00 PM       Please call pt with results of  Colon biopsies which show lymphocytic colitis, the letter is in the mail. She ought to start on Entecort 3mg , # 90, 3 po qd x4 weeks,2 po qd x 4 weeks, 1 po qd x 4 weeks, OV in next 3 weeks.

## 2011-01-17 NOTE — Telephone Encounter (Signed)
Patient is calling because she states the Entocort is $200 and she cannot afford this. She is asking if there is something else she can take. Please, advise.

## 2011-01-17 NOTE — Telephone Encounter (Signed)
If she cannot afford Entecort, please call in Prednisone  5mg , #100, 4 po qd x 10days ,3 po qd x 10 days, 2 po qd x 1-0 days, then 1 po qd x 10 days,

## 2011-01-18 MED ORDER — PREDNISONE 5 MG PO TABS: ORAL_TABLET | ORAL | Status: DC

## 2011-01-18 NOTE — Telephone Encounter (Signed)
Patient notified of Dr. Brodie's recommendation. Rx sent to pharmacy 

## 2011-02-02 ENCOUNTER — Ambulatory Visit: Payer: Medicare Other | Admitting: Internal Medicine

## 2011-02-09 ENCOUNTER — Ambulatory Visit: Payer: Medicare Other | Admitting: Internal Medicine

## 2011-02-10 ENCOUNTER — Ambulatory Visit (INDEPENDENT_AMBULATORY_CARE_PROVIDER_SITE_OTHER): Payer: Medicare Other | Admitting: Internal Medicine

## 2011-02-10 ENCOUNTER — Encounter: Payer: Self-pay | Admitting: Internal Medicine

## 2011-02-10 VITALS — BP 132/60 | HR 76 | Ht 65.0 in | Wt 214.0 lb

## 2011-02-10 DIAGNOSIS — K5289 Other specified noninfective gastroenteritis and colitis: Secondary | ICD-10-CM

## 2011-02-10 NOTE — Patient Instructions (Signed)
Greta [email or web address]'Buch.PA

## 2011-02-10 NOTE — Progress Notes (Signed)
[name] May 14, 1951 MRN [id number]     History of Present Illness:  This is a 60 year old white female with a recent diagnosis of lymphocytic colitis. A colonoscopy in July 2012 showed intraepithelial lymphocytes and early collagen deposits consistent with early collagenous colitis. She was started on Entocort 9 mg a day and has been about 70% improved. She still has bouts of diarrhea but not as severe as before. She has occasional crampy abdominal pain. An upper endoscopy and small bowel biopsies showed no evidence of villous atrophy. She was found to have a hyperplastic polyp. A prior colonoscopy in 2008 showed adenomatous polyps. Other medical problems include diabetes, high blood pressure, gastroesophageal reflux and coronary artery disease.   Past Medical History  Diagnosis Date  . Hypertension   . Diabetes mellitus type II, controlled     Diet  . Hyperlipidemia   . GERD (gastroesophageal reflux disease)   . Myocardial infarct   . Neuropathy, peripheral   . Asthma   . Arrhythmia     Heart  . Arthritis   . Chronic headaches   . Kidney stones   . Urinary tract infection   . Ulcer   . Lymphocytic colitis   . Adenomatous colon polyp 2006  . Gastric polyp     hyperplastic   Past Surgical History  Procedure Date  . Back surgery   . Abdominal hysterectomy   . Bladder repair     x2  . Cholecystectomy 2000    reports that she has never smoked. She has never used smokeless tobacco. She reports that she does not drink alcohol or use illicit drugs. family history includes Colon cancer in her paternal uncle and Hypertension in her mother. Allergies  Allergen Reactions  . Penicillins Swelling  . Prednisone     Swelling and shortness of breath   . Shellfish-Derived Products Swelling  . Sulfa Antibiotics Swelling        Review of Systems:  The remainder of the 10  point ROS is negative except as outlined in H&P   Physical Exam: General appearance  Well developed  in no distress. Eyes- non icteric. HEENT nontraumatic, normocephalic. Mouth no lesions, tongue papillated, no cheilosis. Neck supple without adenopathy, thyroid not enlarged, no carotid bruits, no JVD. Lungs Clear to auscultation bilaterally. Cor normal S1 normal S2, regular rhythm , no murmur,  quiet precordium. Abdomen soft obese abdomen with normal active bowel sounds. No distention. Mild tenderness of the left lower quadrant. Rectal: Not repeated. Extremities no pedal edema. Skin no lesions. Neurological alert and oriented x 3. Psychological normal mood and affect.  Assessment and Plan:   Problem #1 Diagnosis of lymphocytic/collagenous colitis. Patient has improved about 70 % on Entocort 9 mg daily. She will be going down to 6 mg a day next week. He will taper him to 1 tablet a day after 4 weeks. She will call us with an update in 8 weeks.   02/10/2011 Lina Sar

## 2011-04-05 LAB — GLUCOSE, CAPILLARY
Glucose-Capillary: 110 — ABNORMAL HIGH
Glucose-Capillary: 117 — ABNORMAL HIGH
Glucose-Capillary: 120 — ABNORMAL HIGH
Glucose-Capillary: 147 — ABNORMAL HIGH
Glucose-Capillary: 177 — ABNORMAL HIGH
Glucose-Capillary: 181 — ABNORMAL HIGH

## 2011-04-05 LAB — CBC
HCT: 42.5
HCT: 42.8
HCT: 43.2
Hemoglobin: 14.1
Hemoglobin: 14.5
Hemoglobin: 15
MCHC: 33.3
MCHC: 34.1
MCHC: 34.2
MCV: 83.8
MCV: 83.8
MCV: 86
Platelets: 215
Platelets: 216
RBC: 4.97
RBC: 5.07
RBC: 5.26 — ABNORMAL HIGH
RDW: 13.5
RDW: 13.7
WBC: 7.1
WBC: 7.8

## 2011-04-05 LAB — COMPREHENSIVE METABOLIC PANEL
ALT: 27
AST: 22
AST: 39 — ABNORMAL HIGH
Albumin: 3.8
Alkaline Phosphatase: 55
BUN: 12
CO2: 24
Calcium: 9.3
Calcium: 9.6
Chloride: 107
Creatinine, Ser: 0.68
GFR calc Af Amer: >60
GFR calc Af Amer: >60
GFR calc non Af Amer: >60
Glucose, Bld: 117 — ABNORMAL HIGH
Potassium: 3.7
Sodium: 140
Total Bilirubin: 2 — ABNORMAL HIGH
Total Protein: 6.4
Total Protein: 6.6

## 2011-04-05 LAB — DIFFERENTIAL
Basophils Relative: 1
Basophils Relative: 1
Eosinophils Absolute: 0.3
Eosinophils Absolute: 0.3
Eosinophils Relative: 3
Eosinophils Relative: 4
Lymphs Abs: 2.4
Lymphs Abs: 2.8
Monocytes Absolute: 0.5
Monocytes Relative: 7
Monocytes Relative: 7
Neutrophils Relative %: 54
Neutrophils Relative %: 58

## 2011-04-05 LAB — BASIC METABOLIC PANEL
BUN: 10
CO2: 25
CO2: 25
Calcium: 9.7
Calcium: 9.8
Chloride: 107
Chloride: 107
Chloride: 108
Creatinine, Ser: 0.67
GFR calc Af Amer: >60
GFR calc Af Amer: >60
Glucose, Bld: 104 — ABNORMAL HIGH
Glucose, Bld: 99
Potassium: 3.4 — ABNORMAL LOW
Potassium: 3.8
Sodium: 137
Sodium: 140

## 2011-04-05 LAB — HEMOGLOBIN A1C: Mean Plasma Glucose: 111

## 2011-04-05 LAB — LIPID PANEL
Cholesterol: 135
Cholesterol: 139
HDL: 37 — ABNORMAL LOW
LDL Cholesterol: 78
LDL Cholesterol: 83
Triglycerides: 62
VLDL: 12

## 2011-04-05 LAB — PROTIME-INR
INR: 1
INR: 1
INR: 1
Prothrombin Time: 13.6

## 2011-04-05 LAB — CK TOTAL AND CKMB (NOT AT ARMC)
CK, MB: 1.3
Relative Index: 1.2

## 2011-04-05 LAB — CARDIAC PANEL(CRET KIN+CKTOT+MB+TROPI)
Relative Index: INVALID
Troponin I: <0.01

## 2011-04-05 LAB — APTT: aPTT: 26

## 2011-04-05 LAB — TROPONIN I: Troponin I: 0.01

## 2011-06-02 ENCOUNTER — Telehealth: Payer: Self-pay | Admitting: Internal Medicine

## 2011-06-02 NOTE — Telephone Encounter (Signed)
Patient stopped entocort several months ago. She was to taper to 3 mg in September and call back with an update according to the office visit from 02/2011.   She went to her primary care and they started her on prednisone in late October 4 mg a day for her lymphocytic colitis.  She is out and they won't refill it.  She says that the entocort caused nausea and this is why she stopped it.  She is having loose stool 4-5 times a day.  She has been out of prednisone about 3 days.  She was well controlled while on prednisone.  Dr Juanda Chance please advise

## 2011-06-02 NOTE — Telephone Encounter (Signed)
According to my Office note from Aug 8,2012, she was supposed to call us with an update in 8 weeks but she did not. She needs to have an appointment first available. In the meantime OK to send Prednisone 1 mg,  # 120, 4 po qd till she sees me. No refills

## 2011-06-02 NOTE — Telephone Encounter (Signed)
Left message for patient to call back  

## 2011-06-03 MED ORDER — PREDNISONE 1 MG PO TABS: ORAL_TABLET | ORAL | Status: DC

## 2011-06-03 NOTE — Telephone Encounter (Signed)
I have scheduled her for 07/06/11 8:15.  She is advised of Dr Regino Schultze instructions

## 2011-07-06 ENCOUNTER — Ambulatory Visit (INDEPENDENT_AMBULATORY_CARE_PROVIDER_SITE_OTHER): Payer: Medicare Other | Admitting: Internal Medicine

## 2011-07-06 ENCOUNTER — Ambulatory Visit: Payer: Medicare Other | Admitting: Internal Medicine

## 2011-07-06 ENCOUNTER — Encounter: Payer: Self-pay | Admitting: Internal Medicine

## 2011-07-06 VITALS — BP 130/80 | HR 80 | Ht 65.0 in | Wt 218.0 lb

## 2011-07-06 DIAGNOSIS — K5289 Other specified noninfective gastroenteritis and colitis: Secondary | ICD-10-CM

## 2011-07-06 MED ORDER — DICYCLOMINE HCL 20 MG PO TABS: 20.0000 mg | ORAL_TABLET | Freq: Two times a day (BID) | ORAL | Status: DC

## 2011-07-06 MED ORDER — OMEPRAZOLE 20 MG PO CPDR: 20.0000 mg | DELAYED_RELEASE_CAPSULE | Freq: Two times a day (BID) | ORAL | Status: DC

## 2011-07-06 NOTE — Patient Instructions (Signed)
We have sent the following medications to your pharmacy for you to pick up at your convenience: Bentyl 20 mg twice daily Please contact phone # [phone] for information regarding gastric bypass. CC: Dr Edgardo Roys O'Buch

## 2011-07-06 NOTE — Progress Notes (Signed)
[name] 28-May-1951 MRN [id number]    History of Present Illness:  This is a 61 year old white female with a history of lymphocytic colitis. A colonoscopy in July 2012 showed a marked increase in intra epithelial lymphocytes and increased collagen deposits consistent with lymphocytic colitis. A prior colonoscopy in 2008 showed an adenomatous polyp. She was initially treated with Entocort and subsequently prednisone with complete resolution of her symptoms. She is currently doing well but does not want to take prednisone in the future. It causes her fluid retention and weight gain. She is interested in gastric bypass surgery. Additional problems diabetes, coronary artery disease, status post remote cholecystectomy. An upper endoscopy in July 2012 and small bowel biopsies were normal.   Past Medical History  Diagnosis Date  . Hypertension   . Diabetes mellitus type II, controlled     Diet  . Hyperlipidemia   . GERD (gastroesophageal reflux disease)   . Myocardial infarct   . Neuropathy, peripheral   . Asthma   . Arrhythmia     Heart  . Arthritis   . Chronic headaches   . Kidney stones   . Urinary tract infection   . Ulcer   . Lymphocytic colitis   . Adenomatous colon polyp 2006  . Gastric polyp     hyperplastic   Past Surgical History  Procedure Date  . Back surgery   . Abdominal hysterectomy   . Bladder repair     x2  . Cholecystectomy 2000    reports that she has never smoked. She has never used smokeless tobacco. She reports that she does not drink alcohol or use illicit drugs. family history includes Colon cancer in her paternal uncle and Hypertension in her mother. Allergies  Allergen Reactions  . Penicillins Swelling  . Shellfish-Derived Products Swelling  . Sulfa Antibiotics Swelling        Review of Systems: Positive for question of dysphagia. Denies heartburn. Takes omeprazole 20 mg daily but usually needs to a day  The remainder of the 10 point ROS is  negative except as outlined in H&P   Physical Exam: General appearance  Well developed, in no distress. Eyes- non icteric. HEENT nontraumatic, normocephalic. Mouth no lesions, tongue papillated, no cheilosis. Neck supple without adenopathy, thyroid not enlarged, no carotid bruits, no JVD. Lungs Clear to auscultation bilaterally. Cor normal S1, normal S2, regular rhythm, no murmur,  quiet precordium. Abdomen: Soft with tenderness in epigastrium. Normoactive bowel sounds. Liver edge at costal margin.  Rectal: Not done Extremities no pedal edema. Skin no lesions. Neurological alert and oriented x 3. Psychological normal mood and affect.  Assessment and Plan:  Problem #1 Lymphocytic colitis under control at the moment after a course of prednisone. She is reluctant to take prednisone in the future. We will use Bentyl 20 mg when necessary as well as dietary modifications.  Problem #2 irritable bowel syndrome. Her depression and stress are contributing to diarrhea.  Problem #3 Dysphagia due to gastroesophageal reflux. She is status post dilatation 3 months ago with a 27F maloney dilator. Patient will continue omeprazole 20 mg twice a day.     07/06/2011 Lina Sar

## 2011-08-17 ENCOUNTER — Other Ambulatory Visit: Payer: Self-pay | Admitting: Obstetrics and Gynecology

## 2011-10-07 IMAGING — CR DG CHEST 1V PORT
1 series · 1 of 1 positions shown · non-contrast
Comparison: Chest x-ray 01/20/2009.

CLINICAL DATA: Chest pain and shortness of breath.

PORTABLE CHEST - 1 VIEW

[AP]
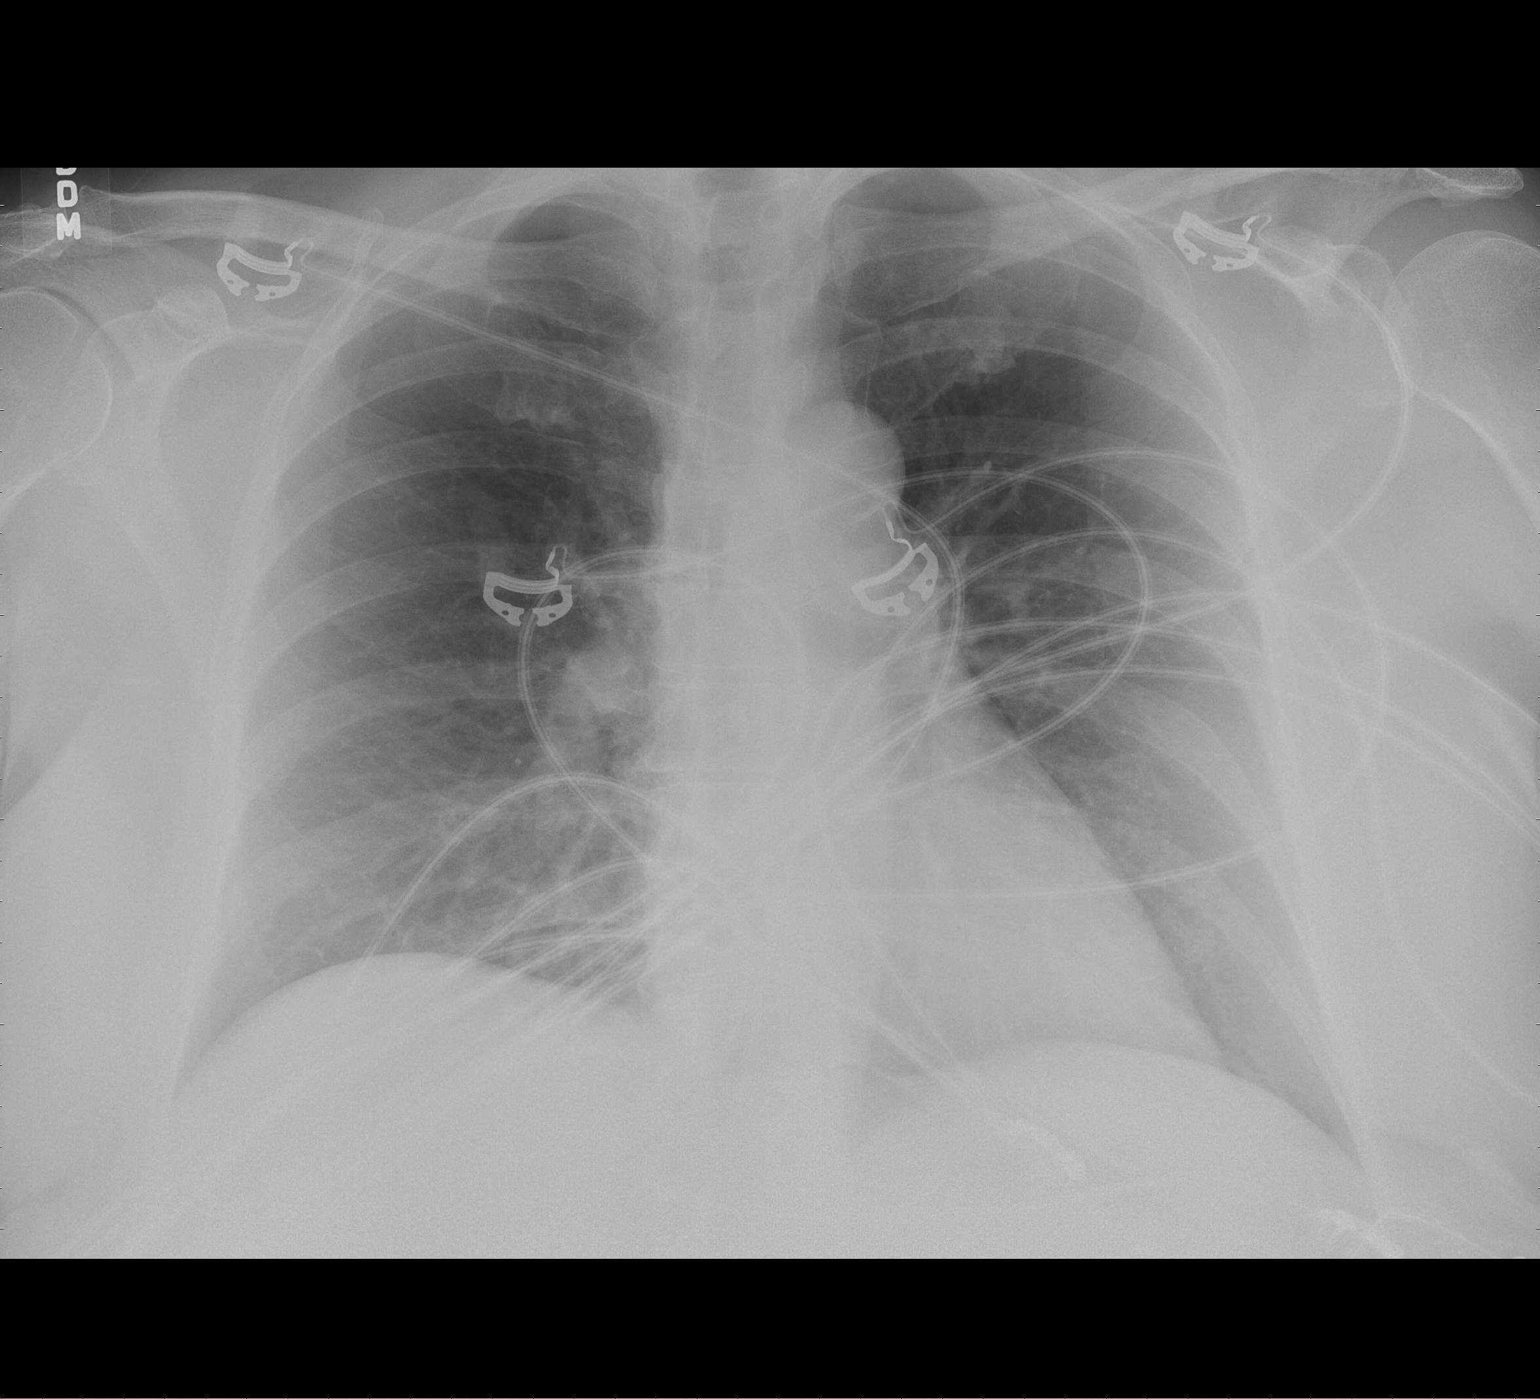

[1 of 1 positions shown; findings below may reference images not displayed]

FINDINGS: The cardiac silhouette, mediastinal and hilar contours
are within normal limits.  Lungs are clear.  The bony thorax is
intact.
IMPRESSION: No acute cardiopulmonary findings.

## 2011-12-31 ENCOUNTER — Other Ambulatory Visit: Payer: Self-pay | Admitting: Internal Medicine

## 2012-05-07 ENCOUNTER — Other Ambulatory Visit: Payer: Self-pay | Admitting: Internal Medicine

## 2012-07-26 ENCOUNTER — Encounter (HOSPITAL_COMMUNITY): Payer: Self-pay | Admitting: Pharmacy Technician

## 2012-07-27 ENCOUNTER — Other Ambulatory Visit: Payer: Self-pay | Admitting: *Deleted

## 2012-08-08 ENCOUNTER — Encounter (HOSPITAL_COMMUNITY)
Admission: RE | Disposition: A | Discharge status: Home / Self Care | Payer: Self-pay | Source: Ambulatory Visit | Attending: Cardiovascular Disease

## 2012-08-08 ENCOUNTER — Ambulatory Visit (HOSPITAL_COMMUNITY)
Admission: RE | Admit: 2012-08-08 | Discharge: 2012-08-08 | Disposition: A | Discharge status: Home / Self Care | Payer: Medicare Other | Source: Ambulatory Visit | Attending: Cardiovascular Disease | Admitting: Cardiovascular Disease

## 2012-08-08 DIAGNOSIS — E119 Type 2 diabetes mellitus without complications: Secondary | ICD-10-CM | POA: Insufficient documentation

## 2012-08-08 DIAGNOSIS — G609 Hereditary and idiopathic neuropathy, unspecified: Secondary | ICD-10-CM | POA: Insufficient documentation

## 2012-08-08 DIAGNOSIS — R609 Edema, unspecified: Secondary | ICD-10-CM | POA: Insufficient documentation

## 2012-08-08 DIAGNOSIS — I10 Essential (primary) hypertension: Secondary | ICD-10-CM | POA: Diagnosis present

## 2012-08-08 DIAGNOSIS — J45909 Unspecified asthma, uncomplicated: Secondary | ICD-10-CM | POA: Insufficient documentation

## 2012-08-08 DIAGNOSIS — R110 Nausea: Secondary | ICD-10-CM | POA: Insufficient documentation

## 2012-08-08 DIAGNOSIS — R0989 Other specified symptoms and signs involving the circulatory and respiratory systems: Secondary | ICD-10-CM | POA: Insufficient documentation

## 2012-08-08 DIAGNOSIS — E785 Hyperlipidemia, unspecified: Secondary | ICD-10-CM | POA: Insufficient documentation

## 2012-08-08 DIAGNOSIS — R42 Dizziness and giddiness: Secondary | ICD-10-CM | POA: Insufficient documentation

## 2012-08-08 DIAGNOSIS — E669 Obesity, unspecified: Secondary | ICD-10-CM | POA: Diagnosis present

## 2012-08-08 DIAGNOSIS — K219 Gastro-esophageal reflux disease without esophagitis: Secondary | ICD-10-CM | POA: Insufficient documentation

## 2012-08-08 DIAGNOSIS — R079 Chest pain, unspecified: Secondary | ICD-10-CM | POA: Insufficient documentation

## 2012-08-08 DIAGNOSIS — R0609 Other forms of dyspnea: Secondary | ICD-10-CM | POA: Insufficient documentation

## 2012-08-08 DIAGNOSIS — I2510 Atherosclerotic heart disease of native coronary artery without angina pectoris: Secondary | ICD-10-CM | POA: Insufficient documentation

## 2012-08-08 DIAGNOSIS — R109 Unspecified abdominal pain: Secondary | ICD-10-CM | POA: Insufficient documentation

## 2012-08-08 DIAGNOSIS — E1169 Type 2 diabetes mellitus with other specified complication: Secondary | ICD-10-CM | POA: Diagnosis present

## 2012-08-08 HISTORY — PX: LEFT HEART CATHETERIZATION WITH CORONARY ANGIOGRAM: SHX5451

## 2012-08-08 LAB — CBC
Hemoglobin: 13.1 g/dL (ref 12.0–15.0)
MCH: 28.1 pg (ref 26.0–34.0)
MCHC: 33.5 g/dL (ref 30.0–36.0)
Platelets: 206 10*3/uL (ref 150–400)
RBC: 4.66 MIL/uL (ref 3.87–5.11)

## 2012-08-08 LAB — BASIC METABOLIC PANEL
BUN: 28 mg/dL — ABNORMAL HIGH (ref 6–23)
Calcium: 9.7 mg/dL (ref 8.4–10.5)
GFR calc Af Amer: 61 mL/min — ABNORMAL LOW (ref >90)
GFR calc non Af Amer: 52 mL/min — ABNORMAL LOW (ref >90)
Glucose, Bld: 134 mg/dL — ABNORMAL HIGH (ref 70–99)
Potassium: 4.8 mEq/L (ref 3.5–5.1)
Sodium: 138 mEq/L (ref 135–145)

## 2012-08-08 LAB — PROTIME-INR
INR: 0.93 (ref 0.00–1.49)
Prothrombin Time: 12.4 seconds (ref 11.6–15.2)

## 2012-08-08 SURGERY — LEFT HEART CATHETERIZATION WITH CORONARY ANGIOGRAM
Anesthesia: LOCAL

## 2012-08-08 MED ORDER — LIDOCAINE HCL (PF) 1 % IJ SOLN
INTRAMUSCULAR | Status: AC
  Filled 2012-08-08: qty 30

## 2012-08-08 MED ORDER — DIPHENHYDRAMINE HCL 50 MG/ML IJ SOLN
25.0000 mg | INTRAMUSCULAR | Status: AC
  Administered 2012-08-08: 25 mg via INTRAVENOUS

## 2012-08-08 MED ORDER — METHYLPREDNISOLONE SODIUM SUCC 125 MG IJ SOLR
60.0000 mg | INTRAMUSCULAR | Status: AC
  Administered 2012-08-08: 60 mg via INTRAVENOUS

## 2012-08-08 MED ORDER — HYDROCODONE-ACETAMINOPHEN 10-325 MG PO TABS
1.0000 | ORAL_TABLET | Freq: Once | ORAL | Status: DC
  Filled 2012-08-08: qty 1

## 2012-08-08 MED ORDER — SODIUM CHLORIDE 0.9 % IJ SOLN: 3.0000 mL | INTRAMUSCULAR | Status: DC | PRN

## 2012-08-08 MED ORDER — SODIUM CHLORIDE 0.9 % IV SOLN
INTRAVENOUS | Status: DC
  Administered 2012-08-08: 09:00:00 via INTRAVENOUS

## 2012-08-08 MED ORDER — FAMOTIDINE IN NACL 20-0.9 MG/50ML-% IV SOLN
20.0000 mg | INTRAVENOUS | Status: AC
  Administered 2012-08-08: 20 mg via INTRAVENOUS
  Filled 2012-08-08: qty 50

## 2012-08-08 MED ORDER — DIAZEPAM 5 MG PO TABS
ORAL_TABLET | ORAL | Status: AC
  Filled 2012-08-08: qty 1

## 2012-08-08 MED ORDER — HEPARIN (PORCINE) IN NACL 2-0.9 UNIT/ML-% IJ SOLN
INTRAMUSCULAR | Status: AC
  Filled 2012-08-08: qty 1000

## 2012-08-08 MED ORDER — NITROGLYCERIN 1 MG/10 ML FOR IR/CATH LAB
INTRA_ARTERIAL | Status: AC
  Filled 2012-08-08: qty 10

## 2012-08-08 MED ORDER — DIAZEPAM 5 MG PO TABS
5.0000 mg | ORAL_TABLET | ORAL | Status: AC
  Administered 2012-08-08: 5 mg via ORAL

## 2012-08-08 NOTE — H&P (Signed)
[name] is an 62 y.o. female.   Chief Complaint:  Chest and back pain HPI:   The patient is a 62 yo obese female with a history of CAD, HTN, DM2, HLD, GERD, asthma, peripheral neuropathy.  Her last cardiac cath was 03/2008 which revealed low normal left ventricular contractility (EF 50-55%) with focal area of hypocontractility involving the distal apical inferior wall on the right anterior oblique projection and involving the apical inferolateral wall on the left anterior oblique projection.  Mild coronary obstructive disease with smooth 20-30% narrowing in  the left anterior descending in the region of the first diagonal  takeoff, as well as mild smooth 20% narrowing in the right coronary  Artery.  Her last NST was 07/17/12 and showed moderate inferior transmural scar and mild apical ischemia.  Inferior wall segment hypokinesis.  EF 50%.    Echo performed 12/13/2011:  EF 50-55% LV wall thickness normal, EF 50-55%, mildly dilated LA, Normal ventricle sizes.  The patient presents today from [facility] medical for left heart cath and possible PCI.  She complains of DOE, dizziness, CP which is worse with exertion and eases off with rest, night sweats, nausea, chronic ABD pain, left thigh edema.  She describes the CP as burning and radiates to the back.  She also says it feels like someone hit her in the chest with a fist.     Past Medical History  Diagnosis Date  . Hypertension   . Diabetes mellitus type II, controlled     Diet  . Hyperlipidemia   . GERD (gastroesophageal reflux disease)   . Myocardial infarct   . Neuropathy, peripheral   . Asthma   . Arrhythmia     Heart  . Arthritis   . Chronic headaches   . Kidney stones   . Urinary tract infection   . Ulcer   . Lymphocytic colitis   . Adenomatous colon polyp 2006  . Gastric polyp     hyperplastic    Past Surgical History  Procedure Date  . Back surgery   . Abdominal hysterectomy   . Bladder repair     x2  . Cholecystectomy 2000     Family History  Problem Relation Age of Onset  . Colon cancer Paternal Uncle   . Hypertension Mother    Social History:  reports that she has never smoked. She has never used smokeless tobacco. She reports that she does not drink alcohol or use illicit drugs.  Allergies:  Allergies  Allergen Reactions  . Penicillins Anaphylaxis and Swelling  . Shellfish-Derived Products Anaphylaxis and Swelling  . Sulfa Antibiotics Anaphylaxis and Swelling    Medications Prior to Admission  Medication Sig Dispense Refill  . albuterol (PROVENTIL HFA;VENTOLIN HFA) 108 (90 BASE) MCG/ACT inhaler Inhale 2 puffs into the lungs every 6 (six) hours as needed. For shortness of breath      . ALPRAZolam (XANAX) 1 MG tablet Take 1 mg by mouth daily as needed. For anxiety      . aspirin 81 MG tablet Take 81 mg by mouth daily.       [name] atenolol (TENORMIN) 25 MG tablet Take 25 mg by mouth daily.       . Biotin 5000 MCG TABS Take 1 tablet by mouth 2 (two) times daily.       . busPIRone (BUSPAR) 10 MG tablet Take 10 mg by mouth 2 (two) times daily as needed. For anxiety      . Cholecalciferol (VITAMIN D) 2000  UNITS tablet Take 2,000 Units by mouth daily.       . Coenzyme Q10 (COQ10 PO) Take 1 capsule by mouth daily.      . diclofenac (VOLTAREN) 75 MG EC tablet Take 75 mg by mouth 2 (two) times daily.       . fexofenadine (ALLEGRA) 180 MG tablet Take 180 mg by mouth daily.       . furosemide (LASIX) 20 MG tablet Take 20 mg by mouth as needed. For swelling      . HYDROcodone-acetaminophen (NORCO) 10-325 MG per tablet Take 1 tablet by mouth every 6 (six) hours as needed. For pain      . Linoleic Acid Conjugated (CLA PO) Take 1 tablet by mouth daily.      [name] lisinopril (PRINIVIL,ZESTRIL) 10 MG tablet Take 10 mg by mouth daily.       . magnesium oxide (MAG-OX) 400 MG tablet Take 400 mg by mouth daily.      [name] omeprazole (PRILOSEC) 20 MG capsule TAKE ONE CAPSULE BY MOUTH TWICE A DAY  60 capsule  3  . Pitavastatin  Calcium (LIVALO) 4 MG TABS Take 4 mg by mouth daily.       . pravastatin (PRAVACHOL) 40 MG tablet Take 40 mg by mouth daily.      . Probiotic Product (PROBIOTIC DAILY PO) Take 1 tablet by mouth daily.      [name] tiZANidine (ZANAFLEX) 4 MG capsule Take 4-8 mg by mouth daily as needed. For muscle pain        Results for orders placed during the hospital encounter of 08/08/12 (from the past 48 hour(s))  GLUCOSE, CAPILLARY     Status: Abnormal   Collection Time   08/08/12  8:09 AM      Component Value Range Comment   Glucose-Capillary 135 (*) 70 - 99 mg/dL    Comment 1 Documented in Chart      Comment 2 Notify RN     BASIC METABOLIC PANEL     Status: Abnormal   Collection Time   08/08/12  8:35 AM      Component Value Range Comment   Sodium 138  135 - 145 mEq/L    Potassium 4.8  3.5 - 5.1 mEq/L HEMOLYSIS AT THIS LEVEL MAY AFFECT RESULT   Chloride 104  96 - 112 mEq/L    CO2 25  19 - 32 mEq/L    Glucose, Bld 134 (*) 70 - 99 mg/dL    BUN 28 (*) 6 - 23 mg/dL    Creatinine, Ser 4.09 (*) 0.50 - 1.10 mg/dL    Calcium 9.7  8.4 - 81.1 mg/dL    GFR calc non Af Amer 52 (*) >90 mL/min    GFR calc Af Amer 61 (*) >90 mL/min   CBC     Status: Normal   Collection Time   08/08/12  8:35 AM      Component Value Range Comment   WBC 6.7  4.0 - 10.5 K/uL    RBC 4.66  3.87 - 5.11 MIL/uL    Hemoglobin 13.1  12.0 - 15.0 g/dL    HCT 91.4  78.2 - 95.6 %    MCV 83.9  78.0 - 100.0 fL    MCH 28.1  26.0 - 34.0 pg    MCHC 33.5  30.0 - 36.0 g/dL    RDW 21.3  08.6 - 57.8 %    Platelets 206  150 - 400 K/uL   PROTIME-INR  Status: Normal   Collection Time   08/08/12  8:35 AM      Component Value Range Comment   Prothrombin Time 12.4  11.6 - 15.2 seconds    INR 0.93  0.00 - 1.49    No results found.  Review of Systems  Constitutional: Positive for diaphoresis. Negative for fever.  HENT: Positive for congestion.   Respiratory: Positive for shortness of breath. Negative for cough and wheezing.   Cardiovascular:  Positive for chest pain and leg swelling (Left left). Negative for PND.  Gastrointestinal: Positive for nausea and abdominal pain (Chronic). Negative for vomiting, diarrhea, constipation and blood in stool.  Genitourinary: Negative for dysuria and hematuria.  Musculoskeletal: Positive for back pain.  Neurological: Positive for dizziness.    Blood pressure 113/55, pulse 64, temperature 97.7 F (36.5 C), temperature source Oral, resp. rate 18, height 5\' 4"  (1.626 m), weight 105.235 kg (232 lb), SpO2 96.00%. Physical Exam  Constitutional:       Morbidly obese,  SOB after going to the restroom  HENT:  Head: Normocephalic and atraumatic.  Eyes: EOM are normal. Pupils are equal, round, and reactive to light. No scleral icterus.  Neck: Normal range of motion. Neck supple. No JVD present.  Cardiovascular: Normal rate, regular rhythm, S1 normal and S2 normal.   No murmur heard. Pulses:      Radial pulses are 2+ on the right side, and 2+ on the left side.       Dorsalis pedis pulses are 2+ on the right side, and 2+ on the left side.       No Carotid Bruits.  Respiratory: Effort normal and breath sounds normal. No respiratory distress. She has no wheezes. She has no rales.  GI: Soft. Bowel sounds are normal. There is no tenderness.  Lymphadenopathy:    She has no cervical adenopathy.  Neurological: She is alert. She exhibits abnormal muscle tone (Weak in the left leg.).  Skin: Skin is warm and dry.  Psychiatric: She has a normal mood and affect.     Assessment/Plan Patient Active Hospital Problem List: Exertional angina (08/08/2012) GERD (gastroesophageal reflux disease) (12/27/2010) Dizziness (08/08/2012) HTN (hypertension) (08/08/2012) Diabetes mellitus type 2 in obese (08/08/2012) Obesity (08/08/2012) HLD (hyperlipidemia) (08/08/2012)  Plan:  LHC with possible PCI   HAGER, BRYAN 08/08/2012, 9:38 AM    Agree with note written by Jones Skene PAC  Non critical CAD in the past by cath (2009,  TK). + CRF. CP worrisome for [place] with + MPI for cath.  Runell Gess 08/08/2012 11:07 AM

## 2012-08-08 NOTE — CV Procedure (Signed)
[name] is a 62 y.o. female    [id number] LOCATION:  FACILITY: [facility]  PHYSICIAN: Nanetta Batty, M.D. 03-29-1951   DATE OF PROCEDURE:  08/08/2012  DATE OF DISCHARGE:  [facility]  CARDIAC CATHETERIZATION     History obtained from chart review. The patient is a 61 yo obese female with a history of CAD, HTN, DM2, HLD, GERD, asthma, peripheral neuropathy. Her last cardiac cath was 03/2008 which revealed low normal left ventricular contractility (EF 50-55%) with focal area of hypocontractility involving the distal apical inferior wall on the right anterior oblique projection and involving the apical inferolateral wall on the left anterior oblique projection. Mild coronary obstructive disease with smooth 20-30% narrowing in the left anterior descending in the region of the first diagonal takeoff, as well as mild smooth 20% narrowing in the right coronary Artery. Her last NST was 07/17/12 and showed moderate inferior transmural scar and mild apical ischemia. Inferior wall segment hypokinesis. EF 50%. Echo performed 12/13/2011: EF 50-55% LV wall thickness normal, EF 50-55%, mildly dilated LA, Normal ventricle sizes. The patient presents today from [facility] medical for left heart cath and possible PCI. She complains of DOE, dizziness, CP which is worse with exertion and eases off with rest, night sweats, nausea, chronic ABD pain, left thigh edema. She describes the CP as burning and radiates to the back. She also says it feels like someone hit her in the chest with a fist.     PROCEDURE DESCRIPTION:    The patient was brought to the second floor  [facility] cath lab in the postabsorptive state. She was  premedicated with Valium 5 mg by mouth, IV Pepcid, Benadryl and Medrol for contrast allergy prophylaxis.[name] Her right groin was prepped and shaved in usual sterile fashion. Xylocaine 1% was used for local anesthesia. A 5 French sheath was inserted into the right  common femoral artery using standard Seldinger technique. 5 French right and left Judkins diagnostic catheters along with a 5 French pigtail catheter were used for selective coronary angiography and left ventriculography respectively. Visipaque dye was used for the entirety of the case (70 cc). Retrograde aorta, left ventricular and pullback pressures were recorded.   HEMODYNAMICS:    AO SYSTOLIC/AO DIASTOLIC: 151/72   LV SYSTOLIC/LV DIASTOLIC: 158/17  ANGIOGRAPHIC RESULTS:   1. Left main; normal  2. LAD; minor irregularities 3. Left circumflex; nondominant and normal.  4. Right coronary artery; dominant with at most 30% mid stenosis 5. Left ventriculography; RAO left ventriculogram was performed using  25 mL of Visipaque dye at 12 mL/second. The overall LVEF estimated  60 %Without wall motion abnormalities  IMPRESSION:[name] has essentially normal coronary arteries and normal left function. Her LVEDP was also within normal range. I believe her symptoms are noncardiac and her mildly false positive. An angiogram was performed of the left common femoral artery and her puncture was sealed with a "MYNX" closure device with excellent hemostasis. The patient left the Cath Lab in stable condition. She'll be gently hydrated for 2 hours and then discharged home. She'll followup with Dr. Andris Flurry and/or Arnette Felts.  Runell Gess MD, [facility] 08/08/2012 11:41 AM

## 2012-08-08 NOTE — H&P (Signed)
    Pt was reexamined and existing H & P reviewed. No changes found.  Runell Gess, MD [facility] 08/08/2012 11:09 AM

## 2012-09-27 ENCOUNTER — Other Ambulatory Visit: Payer: Self-pay | Admitting: Obstetrics and Gynecology

## 2013-10-23 ENCOUNTER — Other Ambulatory Visit: Payer: Self-pay | Admitting: Obstetrics and Gynecology

## 2013-12-06 ENCOUNTER — Encounter: Payer: Self-pay | Admitting: *Deleted

## 2013-12-24 ENCOUNTER — Encounter: Payer: Self-pay | Admitting: Internal Medicine

## 2013-12-24 ENCOUNTER — Ambulatory Visit (INDEPENDENT_AMBULATORY_CARE_PROVIDER_SITE_OTHER): Payer: Medicare HMO | Admitting: Internal Medicine

## 2013-12-24 VITALS — BP 120/80 | HR 56 | Ht 64.0 in | Wt 216.0 lb

## 2013-12-24 DIAGNOSIS — R195 Other fecal abnormalities: Secondary | ICD-10-CM

## 2013-12-24 MED ORDER — MOVIPREP 100 G PO SOLR: 1.0000 | Freq: Once | ORAL | Status: DC

## 2013-12-24 NOTE — Patient Instructions (Addendum)
You have been scheduled for a colonoscopy. Please follow written instructions given to you at your visit today.  Please pick up your prep kit at the pharmacy within the next 1-3 days. If you use inhalers (even only as needed), please bring them with you on the day of your procedure. Your physician has requested that you go to www.[email or web address].com and enter the access code given to you at your visit today. This web site gives a general overview about your procedure. However, you should still follow specific instructions given to you by our office regarding your preparation for the procedure.  CC:Dr Duran, Dr Helane Rima

## 2013-12-24 NOTE — Progress Notes (Signed)
[name] 1950-07-08 [id number]  Note: This dictation was prepared with Dragon digital system. Any transcriptional errors that result from this procedure are unintentional.   History of Present Illness:  This is a 63 year old white female who was found to have heme positive stool on a home test by Dr. Helane Rima. We have seen her in the past for lymphocytic colitis which was diagnosed on a colonoscopy in July 2012 and showed increased intraepithelial lymphocytes with collagen deposits. She was treated with Entocort and subsequently with prednisone. She has a history of laparoscopic cholecystectomy. A prior colonoscopy in 2008 showed a tubular adenoma. Her paternal uncle had colon cancer. She denies any visible blood per rectum. An upper endoscopy in July 2012 with a small bowel biopsy showed a mild esophageal stricture. She was dilated with 48 French Maloney dilator and started on Prilosec 20 mg daily as well as on Linzess 125 mcg daily for constipation.    Past Medical History  Diagnosis Date  . Hypertension   . Diabetes mellitus type II, controlled     Diet  . Hyperlipidemia   . GERD (gastroesophageal reflux disease)   . Myocardial infarct   . Neuropathy, peripheral   . Asthma   . Arrhythmia     Heart  . Arthritis   . Chronic headaches   . Kidney stones   . Urinary tract infection   . Ulcer   . Lymphocytic colitis   . Adenomatous colon polyp 2006  . Gastric polyp     hyperplastic  . Headache   . IBS (irritable bowel syndrome)     Past Surgical History  Procedure Laterality Date  . Back surgery    . Abdominal hysterectomy    . Bladder repair      x2  . Cholecystectomy  2000    Allergies  Allergen Reactions  . Penicillins Anaphylaxis and Swelling  . Shellfish-Derived Products Anaphylaxis and Swelling  . Sulfa Antibiotics Anaphylaxis and Swelling    Family history and social history have been reviewed.  Review of Systems:   The remainder of the 10 point ROS is  negative except as outlined in the H&P  Physical Exam: General Appearance Well developed, in no distress Eyes  Non icteric  HEENT  Non traumatic, normocephalic  Mouth No lesion, tongue papillated, no cheilosis Neck Supple without adenopathy, thyroid not enlarged, no carotid bruits, no JVD Lungs Clear to auscultation bilaterally COR Normal S1, normal S2, regular rhythm, no murmur, quiet precordium Abdomen Soft with normoactive bowel sounds. No palpable masses. No distention. Interactive costal margin  Rectal Rectal and anoscopic exam reveals first-grade internal hemorrhoids. There were no external hemorrhoids. Anal canal is unremarkable. Stool is Hemoccult negative  Extremities  No pedal edema Skin No lesions Neurological Alert and oriented x 3 Psychological Normal mood and affect  Assessment and Plan:   Problem #38 63 year old white female with a family history of colon cancer in an indirect relative and personal history of adenomatous polyps. She is now having occult GI blood loss. She has a history of lymphocytic colitis and was in the past on Entocort but she is currently constipated. We will go ahead with a colonoscopy to rule out colon cancer.    Delfin Edis 12/24/2013

## 2013-12-25 ENCOUNTER — Encounter: Payer: Self-pay | Admitting: Internal Medicine

## 2013-12-30 ENCOUNTER — Encounter: Payer: Self-pay | Admitting: Internal Medicine

## 2014-01-10 ENCOUNTER — Telehealth: Payer: Self-pay | Admitting: Internal Medicine

## 2014-01-10 NOTE — Telephone Encounter (Signed)
Coupon placed at front desk for free prep. Patient advised.

## 2014-01-14 ENCOUNTER — Ambulatory Visit: Payer: Medicare Other | Admitting: Internal Medicine

## 2014-02-19 ENCOUNTER — Encounter: Payer: Self-pay | Admitting: Internal Medicine

## 2014-02-19 ENCOUNTER — Ambulatory Visit (AMBULATORY_SURGERY_CENTER): Payer: Medicare HMO | Admitting: Internal Medicine

## 2014-02-19 VITALS — BP 136/82 | HR 63 | Temp 97.8°F | Resp 16 | Ht 64.0 in | Wt 216.0 lb

## 2014-02-19 DIAGNOSIS — D126 Benign neoplasm of colon, unspecified: Secondary | ICD-10-CM

## 2014-02-19 DIAGNOSIS — R195 Other fecal abnormalities: Secondary | ICD-10-CM

## 2014-02-19 DIAGNOSIS — K648 Other hemorrhoids: Secondary | ICD-10-CM

## 2014-02-19 DIAGNOSIS — Z86010 Personal history of colonic polyps: Secondary | ICD-10-CM

## 2014-02-19 LAB — GLUCOSE, CAPILLARY
GLUCOSE-CAPILLARY: 87 mg/dL (ref 70–99)
Glucose-Capillary: 86 mg/dL (ref 70–99)

## 2014-02-19 MED ORDER — SODIUM CHLORIDE 0.9 % IV SOLN: 500.0000 mL | INTRAVENOUS | Status: DC

## 2014-02-19 NOTE — Progress Notes (Signed)
Report to PACU, RN, vss, BBS= Clear.  

## 2014-02-19 NOTE — Patient Instructions (Signed)

## 2014-02-19 NOTE — Op Note (Signed)
[facility]  [address]. [place], [address]   COLONOSCOPY PROCEDURE REPORT  PATIENT: [name], [name]  MR#: [id number] BIRTHDATE: 1950-10-09 , 60  yrs. old GENDER: Female ENDOSCOPIST: Lafayette Dragon, MD REFERRED LK:[id number] Duran,PA PROCEDURE DATE:  02/19/2014 PROCEDURE:   Colonoscopy with biopsy First Screening Colonoscopy - Avg.  risk and is 50 yrs.  old or older - No.  Prior Negative Screening - Now for repeat screening. N/A  History of Adenoma - Now for follow-up colonoscopy & has been > or = to 3 yrs.  Yes hx of adenoma.  Has been 3 or more years since last colonoscopy.  Polyps Removed Today? No.  Recommend repeat exam, <10 yrs? No. ASA CLASS:   Class II INDICATIONS:heme positive stool on stool test.  Family history of colon cancer in 2 indirect relatives.  Personal history of tubular adenoma in 2008.  Microscopic colitis diagnosed on colonoscopy in July 2012. MEDICATIONS: MAC sedation, administered by CRNA and propofol (Diprivan) 200mg  IV  DESCRIPTION OF PROCEDURE:   After the risks benefits and alternatives of the procedure were thoroughly explained, informed consent was obtained.  A digital rectal exam revealed no abnormalities of the rectum.   The LB [id number]  endoscope was introduced through the anus and advanced to the cecum, which was identified by both the appendix and ileocecal valve. No adverse events experienced.   The quality of the prep was good, using MoviPrep  The instrument was then slowly withdrawn as the colon was fully examined.      COLON FINDINGS: Small internal hemorrhoids were found.   Mild diverticulosis was noted in the sigmoid colon.  Retroflexed views revealed no abnormalities. The time to cecum=5 minutes 28 seconds. Withdrawal time=6 minutes 27 seconds.  The scope was withdrawn and the procedure completed. COMPLICATIONS: There were no complications.  ENDOSCOPIC IMPRESSION: 1.   Small internal hemorrhoids 2.    Mild diverticulosis was noted in the sigmoid colon 3. random colon biopsies for followup of microscopic colitis RECOMMENDATIONS: 1.  Await pathology results 2.   heme positive stool is likely related to internal hemorrhoids 3. recall colonoscopy in 10 years  eSigned:  Lafayette Dragon, MD 02/19/2014 3:18 PM   cc:   PATIENT NAME:  [name], [name] MR#: [id number]

## 2014-02-19 NOTE — Progress Notes (Signed)
Called to room to assist during endoscopic procedure.  Patient ID and intended procedure confirmed with present staff. Received instructions for my participation in the procedure from the performing physician.  

## 2014-02-20 ENCOUNTER — Telehealth: Payer: Self-pay

## 2014-02-20 NOTE — Telephone Encounter (Signed)
  Follow up Call-  Call back number 02/19/2014  Post procedure Call Back phone  # [phone]  Permission to leave phone message Yes     Patient questions:  Do you have a fever, pain , or abdominal swelling? No. Pain Score  0 *  Have you tolerated food without any problems? Yes.    Have you been able to return to your normal activities? Yes.    Do you have any questions about your discharge instructions: Diet   No. Medications  No. Follow up visit  No.  Do you have questions or concerns about your Care? No.  Actions: * If pain score is 4 or above: No action needed, pain <4.

## 2014-02-25 ENCOUNTER — Encounter: Payer: Self-pay | Admitting: Internal Medicine

## 2014-02-27 ENCOUNTER — Encounter: Payer: Self-pay | Admitting: *Deleted

## 2014-04-04 ENCOUNTER — Encounter: Payer: Self-pay | Admitting: Internal Medicine

## 2014-06-12 ENCOUNTER — Encounter (HOSPITAL_COMMUNITY): Payer: Self-pay | Admitting: Cardiovascular Disease

## 2014-06-13 ENCOUNTER — Emergency Department (HOSPITAL_BASED_OUTPATIENT_CLINIC_OR_DEPARTMENT_OTHER)
Admission: EM | Admit: 2014-06-13 | Discharge: 2014-06-13 | Disposition: A | Discharge status: Home / Self Care | Payer: Medicare HMO | Attending: Emergency Medicine | Admitting: Emergency Medicine

## 2014-06-13 ENCOUNTER — Encounter (HOSPITAL_BASED_OUTPATIENT_CLINIC_OR_DEPARTMENT_OTHER): Payer: Self-pay

## 2014-06-13 ENCOUNTER — Emergency Department (HOSPITAL_BASED_OUTPATIENT_CLINIC_OR_DEPARTMENT_OTHER): Payer: Medicare HMO

## 2014-06-13 DIAGNOSIS — M1990 Unspecified osteoarthritis, unspecified site: Secondary | ICD-10-CM | POA: Insufficient documentation

## 2014-06-13 DIAGNOSIS — Z8669 Personal history of other diseases of the nervous system and sense organs: Secondary | ICD-10-CM | POA: Diagnosis not present

## 2014-06-13 DIAGNOSIS — E785 Hyperlipidemia, unspecified: Secondary | ICD-10-CM | POA: Insufficient documentation

## 2014-06-13 DIAGNOSIS — J069 Acute upper respiratory infection, unspecified: Secondary | ICD-10-CM | POA: Insufficient documentation

## 2014-06-13 DIAGNOSIS — Z7982 Long term (current) use of aspirin: Secondary | ICD-10-CM | POA: Diagnosis not present

## 2014-06-13 DIAGNOSIS — Z79899 Other long term (current) drug therapy: Secondary | ICD-10-CM | POA: Insufficient documentation

## 2014-06-13 DIAGNOSIS — J45909 Unspecified asthma, uncomplicated: Secondary | ICD-10-CM | POA: Insufficient documentation

## 2014-06-13 DIAGNOSIS — Z87440 Personal history of urinary (tract) infections: Secondary | ICD-10-CM | POA: Insufficient documentation

## 2014-06-13 DIAGNOSIS — Z87442 Personal history of urinary calculi: Secondary | ICD-10-CM | POA: Insufficient documentation

## 2014-06-13 DIAGNOSIS — Z86010 Personal history of colonic polyps: Secondary | ICD-10-CM | POA: Insufficient documentation

## 2014-06-13 DIAGNOSIS — R05 Cough: Secondary | ICD-10-CM

## 2014-06-13 DIAGNOSIS — R059 Cough, unspecified: Secondary | ICD-10-CM

## 2014-06-13 DIAGNOSIS — K219 Gastro-esophageal reflux disease without esophagitis: Secondary | ICD-10-CM | POA: Diagnosis not present

## 2014-06-13 DIAGNOSIS — Z880 Allergy status to penicillin: Secondary | ICD-10-CM | POA: Diagnosis not present

## 2014-06-13 DIAGNOSIS — I252 Old myocardial infarction: Secondary | ICD-10-CM | POA: Diagnosis not present

## 2014-06-13 DIAGNOSIS — E119 Type 2 diabetes mellitus without complications: Secondary | ICD-10-CM | POA: Diagnosis not present

## 2014-06-13 DIAGNOSIS — I10 Essential (primary) hypertension: Secondary | ICD-10-CM | POA: Insufficient documentation

## 2014-06-13 MED ORDER — AZITHROMYCIN 250 MG PO TABS: 250.0000 mg | ORAL_TABLET | Freq: Every day | ORAL | Status: DC

## 2014-06-13 MED ORDER — ALBUTEROL SULFATE HFA 108 (90 BASE) MCG/ACT IN AERS: 1.0000 | INHALATION_SPRAY | Freq: Four times a day (QID) | RESPIRATORY_TRACT | Status: DC | PRN

## 2014-06-13 NOTE — Discharge Instructions (Signed)
Take azithromycin as directed until gone. Use inhaler as needed for wheezing. Refer to attached documents for more information.

## 2014-06-13 NOTE — ED Notes (Signed)
C/o prod cough, sore throat, head/chest congestion x 1 week

## 2014-06-13 NOTE — ED Provider Notes (Signed)
CSN: [id number]     Arrival date & time 06/13/14  1144 History   First MD Initiated Contact with Patient 06/13/14 1259     Chief Complaint  Patient presents with  . Cough     (Consider location/radiation/quality/duration/timing/severity/associated sxs/prior Treatment) Patient is a 63 y.o. female presenting with cough. The history is provided by the patient. No language interpreter was used.  Cough Cough characteristics:  Productive Sputum characteristics:  Nondescript Severity:  Moderate Onset quality:  Gradual Duration:  1 week Timing:  Constant Progression:  Unchanged Chronicity:  New Smoker: no   Context: not animal exposure, not exposure to allergens, not fumes, not occupational exposure, not sick contacts, not smoke exposure, not upper respiratory infection, not weather changes and not with activity   Relieved by:  Nothing Worsened by:  Nothing tried Ineffective treatments: mucinex. Associated symptoms: sinus congestion and sore throat   Associated symptoms: no chest pain, no chills, no fever and no shortness of breath   Sore throat:    Severity:  Moderate   Onset quality:  Gradual   Duration:  1 week   Timing:  Constant   Progression:  Unchanged Risk factors: no chemical exposure, no recent infection and no recent travel     Past Medical History  Diagnosis Date  . Hypertension   . Diabetes mellitus type II, controlled     Diet  . Hyperlipidemia   . GERD (gastroesophageal reflux disease)   . Myocardial infarct   . Neuropathy, peripheral   . Asthma   . Arrhythmia     Heart  . Arthritis   . Chronic headaches   . Kidney stones   . Urinary tract infection   . Ulcer   . Lymphocytic colitis   . Adenomatous colon polyp 2006  . Gastric polyp     hyperplastic  . Headache   . IBS (irritable bowel syndrome)   . Sleep apnea     no CPAP   Past Surgical History  Procedure Laterality Date  . Back surgery    . Abdominal hysterectomy    . Bladder repair      x2   . Cholecystectomy  2000  . Left heart catheterization with coronary angiogram N/A 08/08/2012    Procedure: LEFT HEART CATHETERIZATION WITH CORONARY ANGIOGRAM;  Surgeon: Lorretta Harp, MD;  Location: [facility] CATH LAB;  Service: Cardiovascular;  Laterality: N/A;  . Abdominal hysterectomy     Family History  Problem Relation Age of Onset  . Colon cancer Paternal Uncle   . Hypertension Mother   . Esophageal cancer Neg Hx   . Stomach cancer Neg Hx   . Rectal cancer Neg Hx    History  Substance Use Topics  . Smoking status: Never Smoker   . Smokeless tobacco: Never Used  . Alcohol Use: No   OB History    No data available     Review of Systems  Constitutional: Negative for fever, chills and fatigue.  HENT: Positive for congestion and sore throat. Negative for trouble swallowing.   Eyes: Negative for visual disturbance.  Respiratory: Positive for cough. Negative for shortness of breath.   Cardiovascular: Negative for chest pain and palpitations.  Gastrointestinal: Negative for nausea, vomiting, abdominal pain and diarrhea.  Genitourinary: Negative for dysuria and difficulty urinating.  Musculoskeletal: Negative for arthralgias and neck pain.  Skin: Negative for color change.  Neurological: Negative for dizziness and weakness.  Psychiatric/Behavioral: Negative for dysphoric mood.      Allergies  Penicillins; Shellfish-derived  products; and Sulfa antibiotics  Home Medications   Prior to Admission medications   Medication Sig Start Date End Date Taking? Authorizing Provider  albuterol (PROVENTIL HFA;VENTOLIN HFA) 108 (90 BASE) MCG/ACT inhaler Inhale 2 puffs into the lungs every 6 (six) hours as needed. For shortness of breath    Historical Provider, MD  ALPRAZolam Duanne Moron) 1 MG tablet Take 1 mg by mouth daily as needed. For anxiety    Historical Provider, MD  aspirin 81 MG tablet Take 81 mg by mouth daily.     Historical Provider, MD  busPIRone (BUSPAR) 10 MG tablet Take 10 mg by  mouth 2 (two) times daily as needed. For anxiety    Historical Provider, MD  Cholecalciferol (VITAMIN D) 2000 UNITS tablet Take 2,000 Units by mouth daily.     Historical Provider, MD  furosemide (LASIX) 20 MG tablet Take 20 mg by mouth as needed. For swelling    Historical Provider, MD  HYDROcodone-acetaminophen (NORCO) 10-325 MG per tablet Take 1 tablet by mouth every 6 (six) hours as needed. For pain    Historical Provider, MD  Linaclotide (LINZESS) 145 MCG CAPS capsule Take 145 mcg by mouth daily.    Historical Provider, MD  lisinopril (PRINIVIL,ZESTRIL) 10 MG tablet Take 10 mg by mouth daily.     Historical Provider, MD  Lorcaserin HCl (BELVIQ) 10 MG TABS Take 1 tablet by mouth 2 (two) times daily.    Historical Provider, MD  metFORMIN (GLUCOPHAGE) 500 MG tablet Take by mouth daily.    Historical Provider, MD  omeprazole (PRILOSEC) 20 MG capsule TAKE ONE CAPSULE BY MOUTH TWICE A DAY 05/07/12   Lafayette Dragon, MD  pravastatin (PRAVACHOL) 40 MG tablet Take 40 mg by mouth daily.    Historical Provider, MD  tiZANidine (ZANAFLEX) 4 MG capsule Take 4-8 mg by mouth daily as needed. For muscle pain    Historical Provider, MD   BP 122/85 mmHg  Pulse 80  Temp(Src) 97.9 F (36.6 C) (Oral)  Resp 18  Ht 5\' 5"  (1.651 m)  Wt 212 lb 2 oz (96.219 kg)  BMI 35.30 kg/m2  SpO2 95% Physical Exam  Constitutional: She is oriented to person, place, and time. She appears well-developed and well-nourished. No distress.  HENT:  Head: Normocephalic and atraumatic.  Mouth/Throat: Oropharynx is clear and moist. No oropharyngeal exudate.  Eyes: Conjunctivae and EOM are normal.  Neck: Normal range of motion.  Cardiovascular: Normal rate and regular rhythm.  Exam reveals no gallop and no friction rub.   No murmur heard. Pulmonary/Chest: Effort normal and breath sounds normal. She has no wheezes. She has no rales. She exhibits no tenderness.  Abdominal: Soft. She exhibits no distension. There is no tenderness. There  is no rebound.  Musculoskeletal: Normal range of motion.  Neurological: She is alert and oriented to person, place, and time. Coordination normal.  Speech is goal-oriented. Moves limbs without ataxia.   Skin: Skin is warm and dry.  Psychiatric: She has a normal mood and affect. Her behavior is normal.  Nursing note and vitals reviewed.   ED Course  Procedures (including critical care time) Labs Review Labs Reviewed - No data to display  Imaging Review Dg Chest 2 View  06/13/2014   CLINICAL DATA:  Cough and congestion for 1 week  EXAM: CHEST  2 VIEW  COMPARISON:  08/11/2010  FINDINGS: The heart size and mediastinal contours are within normal limits. Both lungs are clear. The visualized skeletal structures are unremarkable.  IMPRESSION: No  active cardiopulmonary disease.   Electronically Signed   By: Inez Catalina M.D.   On: 06/13/2014 13:43     EKG Interpretation None      MDM   Final diagnoses:  Cough  URI (upper respiratory infection)    1:59 PM Chest xray unremarkable for acute changes. Vitals stable and patient afebrile. Patient requests azithromycin which typically "clears her up." Patient will have z-pack and albuterol inhaler. Patient instructed to return with worsening or concerning symptoms.    Alvina Chou, PA-C 06/13/14 Baker, MD 06/13/14 [phone]

## 2014-08-22 ENCOUNTER — Encounter (HOSPITAL_BASED_OUTPATIENT_CLINIC_OR_DEPARTMENT_OTHER): Payer: Self-pay | Admitting: Emergency Medicine

## 2014-08-22 ENCOUNTER — Emergency Department (HOSPITAL_BASED_OUTPATIENT_CLINIC_OR_DEPARTMENT_OTHER): Payer: Medicare HMO

## 2014-08-22 ENCOUNTER — Emergency Department (HOSPITAL_BASED_OUTPATIENT_CLINIC_OR_DEPARTMENT_OTHER)
Admission: EM | Admit: 2014-08-22 | Discharge: 2014-08-22 | Disposition: A | Discharge status: Home / Self Care | Payer: Medicare HMO | Attending: Emergency Medicine | Admitting: Emergency Medicine

## 2014-08-22 DIAGNOSIS — Z872 Personal history of diseases of the skin and subcutaneous tissue: Secondary | ICD-10-CM | POA: Diagnosis not present

## 2014-08-22 DIAGNOSIS — Z79899 Other long term (current) drug therapy: Secondary | ICD-10-CM | POA: Insufficient documentation

## 2014-08-22 DIAGNOSIS — Z7982 Long term (current) use of aspirin: Secondary | ICD-10-CM | POA: Diagnosis not present

## 2014-08-22 DIAGNOSIS — Z880 Allergy status to penicillin: Secondary | ICD-10-CM | POA: Diagnosis not present

## 2014-08-22 DIAGNOSIS — R42 Dizziness and giddiness: Secondary | ICD-10-CM | POA: Diagnosis not present

## 2014-08-22 DIAGNOSIS — Z86010 Personal history of colonic polyps: Secondary | ICD-10-CM | POA: Insufficient documentation

## 2014-08-22 DIAGNOSIS — E119 Type 2 diabetes mellitus without complications: Secondary | ICD-10-CM | POA: Insufficient documentation

## 2014-08-22 DIAGNOSIS — J45909 Unspecified asthma, uncomplicated: Secondary | ICD-10-CM | POA: Insufficient documentation

## 2014-08-22 DIAGNOSIS — Z9889 Other specified postprocedural states: Secondary | ICD-10-CM | POA: Insufficient documentation

## 2014-08-22 DIAGNOSIS — Z87442 Personal history of urinary calculi: Secondary | ICD-10-CM | POA: Insufficient documentation

## 2014-08-22 DIAGNOSIS — M1990 Unspecified osteoarthritis, unspecified site: Secondary | ICD-10-CM | POA: Diagnosis not present

## 2014-08-22 DIAGNOSIS — Z8719 Personal history of other diseases of the digestive system: Secondary | ICD-10-CM | POA: Insufficient documentation

## 2014-08-22 DIAGNOSIS — Z792 Long term (current) use of antibiotics: Secondary | ICD-10-CM | POA: Insufficient documentation

## 2014-08-22 DIAGNOSIS — Z8669 Personal history of other diseases of the nervous system and sense organs: Secondary | ICD-10-CM | POA: Diagnosis not present

## 2014-08-22 DIAGNOSIS — K219 Gastro-esophageal reflux disease without esophagitis: Secondary | ICD-10-CM | POA: Diagnosis not present

## 2014-08-22 DIAGNOSIS — E785 Hyperlipidemia, unspecified: Secondary | ICD-10-CM | POA: Insufficient documentation

## 2014-08-22 DIAGNOSIS — R4701 Aphasia: Secondary | ICD-10-CM | POA: Diagnosis present

## 2014-08-22 DIAGNOSIS — I10 Essential (primary) hypertension: Secondary | ICD-10-CM | POA: Diagnosis not present

## 2014-08-22 DIAGNOSIS — I252 Old myocardial infarction: Secondary | ICD-10-CM | POA: Diagnosis not present

## 2014-08-22 LAB — COMPREHENSIVE METABOLIC PANEL
ALK PHOS: 67 U/L (ref 39–117)
ALT: 23 U/L (ref 0–35)
ANION GAP: 6 (ref 5–15)
AST: 20 U/L (ref 0–37)
Albumin: 4 g/dL (ref 3.5–5.2)
BILIRUBIN TOTAL: 0.6 mg/dL (ref 0.3–1.2)
BUN: 21 mg/dL (ref 6–23)
CO2: 27 mmol/L (ref 19–32)
Calcium: 9.1 mg/dL (ref 8.4–10.5)
Chloride: 104 mmol/L (ref 96–112)
Creatinine, Ser: 0.76 mg/dL (ref 0.50–1.10)
GFR calc non Af Amer: 88 mL/min — ABNORMAL LOW (ref >90)
GLUCOSE: 105 mg/dL — AB (ref 70–99)
Potassium: 3.5 mmol/L (ref 3.5–5.1)
Sodium: 137 mmol/L (ref 135–145)
Total Protein: 7.1 g/dL (ref 6.0–8.3)

## 2014-08-22 LAB — CBC WITH DIFFERENTIAL/PLATELET
Basophils Absolute: 0 10*3/uL (ref 0.0–0.1)
Basophils Relative: 0 % (ref 0–1)
EOS ABS: 0.4 10*3/uL (ref 0.0–0.7)
EOS PCT: 6 % — AB (ref 0–5)
HEMATOCRIT: 42 % (ref 36.0–46.0)
Hemoglobin: 13.8 g/dL (ref 12.0–15.0)
LYMPHS ABS: 2.7 10*3/uL (ref 0.7–4.0)
LYMPHS PCT: 39 % (ref 12–46)
MCH: 27.7 pg (ref 26.0–34.0)
MCHC: 32.9 g/dL (ref 30.0–36.0)
MCV: 84.2 fL (ref 78.0–100.0)
MONOS PCT: 8 % (ref 3–12)
Monocytes Absolute: 0.6 10*3/uL (ref 0.1–1.0)
Neutro Abs: 3.4 10*3/uL (ref 1.7–7.7)
Neutrophils Relative %: 47 % (ref 43–77)
Platelets: 186 10*3/uL (ref 150–400)
RBC: 4.99 MIL/uL (ref 3.87–5.11)
RDW: 14.7 % (ref 11.5–15.5)
WBC: 7.1 10*3/uL (ref 4.0–10.5)

## 2014-08-22 LAB — TROPONIN I: Troponin I: <0.03 ng/mL (ref <0.031)

## 2014-08-22 MED ORDER — SODIUM CHLORIDE 0.9 % IV SOLN
INTRAVENOUS | Status: DC
  Administered 2014-08-22: 1000 mL via INTRAVENOUS

## 2014-08-22 NOTE — ED Notes (Signed)
MD at bedside. 

## 2014-08-22 NOTE — Discharge Instructions (Signed)

## 2014-08-22 NOTE — ED Notes (Signed)
Pt states she was going to doctors office this am.  Started to have slurred speech and dizziness.  Pt was seen at MD office and was instructed to go to ED but pt refused EMS transport.

## 2014-08-22 NOTE — ED Notes (Signed)
Pt up from stretcher to dress with quick pace-dressed self and ambulated with quick steady pace-NAD

## 2014-08-22 NOTE — ED Provider Notes (Addendum)
CSN: [id number]     Arrival date & time 08/22/14  1032 History   First MD Initiated Contact with Patient 08/22/14 1035     Chief Complaint  Patient presents with  . Aphasia  . Dizziness     (Consider location/radiation/quality/duration/timing/severity/associated sxs/prior Treatment) HPI Comments: Patient here with acute onset of dizziness that is worse with head movement with associated ataxia and expressive aphasia. Symptoms started approximately 8 AM and the expressive aphasia has improved. She does have a history of vertigo and this is slightly slimmer. Does have palpitations and dyspnea without anginal chest pain. Went to her doctor's office and they recommended she go to the hospital by EMS. She preferred to come by private vehicle due to financial reasons. On arrival here, she was ataxic but speech has improved. Denies any weakness in her upper or lower extremities. No facial droop noted. Denies any severe headaches or neck pain. No treatment used prior to arrival  Patient is a 64 y.o. female presenting with dizziness. The history is provided by the patient.  Dizziness   Past Medical History  Diagnosis Date  . Hypertension   . Diabetes mellitus type II, controlled     Diet  . Hyperlipidemia   . GERD (gastroesophageal reflux disease)   . Myocardial infarct   . Neuropathy, peripheral   . Asthma   . Arrhythmia     Heart  . Arthritis   . Chronic headaches   . Kidney stones   . Urinary tract infection   . Ulcer   . Lymphocytic colitis   . Adenomatous colon polyp 2006  . Gastric polyp     hyperplastic  . Headache   . IBS (irritable bowel syndrome)   . Sleep apnea     no CPAP   Past Surgical History  Procedure Laterality Date  . Back surgery    . Abdominal hysterectomy    . Bladder repair      x2  . Cholecystectomy  2000  . Left heart catheterization with coronary angiogram N/A 08/08/2012    Procedure: LEFT HEART CATHETERIZATION WITH CORONARY ANGIOGRAM;  Surgeon:  Lorretta Harp, MD;  Location: [facility] CATH LAB;  Service: Cardiovascular;  Laterality: N/A;  . Abdominal hysterectomy     Family History  Problem Relation Age of Onset  . Colon cancer Paternal Uncle   . Hypertension Mother   . Esophageal cancer Neg Hx   . Stomach cancer Neg Hx   . Rectal cancer Neg Hx    History  Substance Use Topics  . Smoking status: Never Smoker   . Smokeless tobacco: Never Used  . Alcohol Use: No   OB History    No data available     Review of Systems  Neurological: Positive for dizziness.  All other systems reviewed and are negative.     Allergies  Penicillins; Shellfish-derived products; and Sulfa antibiotics  Home Medications   Prior to Admission medications   Medication Sig Start Date End Date Taking? Authorizing Provider  albuterol (PROVENTIL HFA;VENTOLIN HFA) 108 (90 BASE) MCG/ACT inhaler Inhale 1-2 puffs into the lungs every 6 (six) hours as needed for wheezing or shortness of breath. 06/13/14   Kaitlyn Szekalski, PA-C  ALPRAZolam (XANAX) 1 MG tablet Take 1 mg by mouth daily as needed. For anxiety    Historical Provider, MD  aspirin 81 MG tablet Take 81 mg by mouth daily.     Historical Provider, MD  azithromycin (ZITHROMAX Z-PAK) 250 MG tablet Take 1 tablet (250 mg  total) by mouth daily. 500mg  PO day 1, then 250mg  PO days 205 06/13/14   Kaitlyn Szekalski, PA-C  busPIRone (BUSPAR) 10 MG tablet Take 10 mg by mouth 2 (two) times daily as needed. For anxiety    Historical Provider, MD  Cholecalciferol (VITAMIN D) 2000 UNITS tablet Take 2,000 Units by mouth daily.     Historical Provider, MD  furosemide (LASIX) 20 MG tablet Take 20 mg by mouth as needed. For swelling    Historical Provider, MD  HYDROcodone-acetaminophen (NORCO) 10-325 MG per tablet Take 1 tablet by mouth every 6 (six) hours as needed. For pain    Historical Provider, MD  Linaclotide (LINZESS) 145 MCG CAPS capsule Take 145 mcg by mouth daily.    Historical Provider, MD  lisinopril  (PRINIVIL,ZESTRIL) 10 MG tablet Take 10 mg by mouth daily.     Historical Provider, MD  Lorcaserin HCl (BELVIQ) 10 MG TABS Take 1 tablet by mouth 2 (two) times daily.    Historical Provider, MD  metFORMIN (GLUCOPHAGE) 500 MG tablet Take by mouth daily.    Historical Provider, MD  omeprazole (PRILOSEC) 20 MG capsule TAKE ONE CAPSULE BY MOUTH TWICE A DAY 05/07/12   Lafayette Dragon, MD  pravastatin (PRAVACHOL) 40 MG tablet Take 40 mg by mouth daily.    Historical Provider, MD  tiZANidine (ZANAFLEX) 4 MG capsule Take 4-8 mg by mouth daily as needed. For muscle pain    Historical Provider, MD   There were no vitals taken for this visit. Physical Exam  Constitutional: She is oriented to person, place, and time. She appears well-developed and well-nourished.  Non-toxic appearance. No distress.  HENT:  Head: Normocephalic and atraumatic.  Eyes: Conjunctivae, EOM and lids are normal. Pupils are equal, round, and reactive to light.  Neck: Normal range of motion. Neck supple. No tracheal deviation present. No thyroid mass present.  Cardiovascular: Normal rate, regular rhythm and normal heart sounds.  Exam reveals no gallop.   No murmur heard. Pulmonary/Chest: Effort normal and breath sounds normal. No stridor. No respiratory distress. She has no decreased breath sounds. She has no wheezes. She has no rhonchi. She has no rales.  Abdominal: Soft. Normal appearance and bowel sounds are normal. She exhibits no distension. There is no tenderness. There is no rebound and no CVA tenderness.  Musculoskeletal: Normal range of motion. She exhibits no edema or tenderness.  Neurological: She is alert and oriented to person, place, and time. She has normal strength. No cranial nerve deficit or sensory deficit. GCS eye subscore is 4. GCS verbal subscore is 5. GCS motor subscore is 6.  No expressive aphasia noted. Negative Romberg  Skin: Skin is warm and dry. No abrasion and no rash noted.  Psychiatric: She has a normal  mood and affect. Her speech is normal and behavior is normal.  Nursing note and vitals reviewed.   ED Course  Procedures (including critical care time) Labs Review Labs Reviewed  CBC WITH DIFFERENTIAL/PLATELET  COMPREHENSIVE METABOLIC PANEL  TROPONIN I    Imaging Review No results found.   EKG Interpretation   Date/Time:  Friday August 22 2014 10:41:45 EST Ventricular Rate:  77 PR Interval:  148 QRS Duration: 78 QT Interval:  404 QTC Calculation: 457 R Axis:   56 Text Interpretation:  Sinus rhythm with frequent Premature ventricular  complexes in a pattern of bigeminy Nonspecific T wave abnormality Abnormal  ECG ectopy new from prior Confirmed by Severino Paolo  MD, Apolonio Cutting ([id number]) on  08/22/2014 10:48:32 AM  MDM   Final diagnoses:  Aphasia   Patient symptoms have resolved at this point. Patient states that she feels as if this was vertigo. Patient is already seen her doctor due to palpitations and PVCs and she will follow-up with him for that. Patient offered admission for possible TIA and she has deferred at this time. Risk of devastating CVA explained to her and she accepts this Will follow-up with her doctor. This was discussed in front of her cousin   Leota Jacobsen, MD 08/22/14 1224  Leota Jacobsen, MD 08/22/14 1228

## 2014-09-19 ENCOUNTER — Emergency Department (HOSPITAL_COMMUNITY): Payer: Medicare HMO

## 2014-09-19 ENCOUNTER — Observation Stay (HOSPITAL_COMMUNITY)
Admission: EM | Admit: 2014-09-19 | Discharge: 2014-09-20 | Disposition: A | Discharge status: Home / Self Care | Payer: Medicare HMO | Attending: Internal Medicine | Admitting: Internal Medicine

## 2014-09-19 ENCOUNTER — Other Ambulatory Visit (HOSPITAL_COMMUNITY): Payer: Self-pay

## 2014-09-19 ENCOUNTER — Encounter (HOSPITAL_COMMUNITY): Payer: Self-pay | Admitting: Emergency Medicine

## 2014-09-19 DIAGNOSIS — R110 Nausea: Secondary | ICD-10-CM | POA: Diagnosis not present

## 2014-09-19 DIAGNOSIS — G629 Polyneuropathy, unspecified: Secondary | ICD-10-CM | POA: Insufficient documentation

## 2014-09-19 DIAGNOSIS — G4730 Sleep apnea, unspecified: Secondary | ICD-10-CM | POA: Diagnosis present

## 2014-09-19 DIAGNOSIS — K589 Irritable bowel syndrome without diarrhea: Secondary | ICD-10-CM | POA: Diagnosis not present

## 2014-09-19 DIAGNOSIS — M1990 Unspecified osteoarthritis, unspecified site: Secondary | ICD-10-CM | POA: Diagnosis not present

## 2014-09-19 DIAGNOSIS — Z79899 Other long term (current) drug therapy: Secondary | ICD-10-CM | POA: Diagnosis not present

## 2014-09-19 DIAGNOSIS — I252 Old myocardial infarction: Secondary | ICD-10-CM | POA: Diagnosis not present

## 2014-09-19 DIAGNOSIS — J45909 Unspecified asthma, uncomplicated: Secondary | ICD-10-CM | POA: Diagnosis not present

## 2014-09-19 DIAGNOSIS — I209 Angina pectoris, unspecified: Secondary | ICD-10-CM | POA: Diagnosis present

## 2014-09-19 DIAGNOSIS — Z7982 Long term (current) use of aspirin: Secondary | ICD-10-CM | POA: Insufficient documentation

## 2014-09-19 DIAGNOSIS — Z6836 Body mass index (BMI) 36.0-36.9, adult: Secondary | ICD-10-CM | POA: Diagnosis not present

## 2014-09-19 DIAGNOSIS — E119 Type 2 diabetes mellitus without complications: Secondary | ICD-10-CM | POA: Insufficient documentation

## 2014-09-19 DIAGNOSIS — Z9049 Acquired absence of other specified parts of digestive tract: Secondary | ICD-10-CM | POA: Diagnosis not present

## 2014-09-19 DIAGNOSIS — R079 Chest pain, unspecified: Secondary | ICD-10-CM | POA: Diagnosis present

## 2014-09-19 DIAGNOSIS — R072 Precordial pain: Principal | ICD-10-CM | POA: Insufficient documentation

## 2014-09-19 DIAGNOSIS — M79602 Pain in left arm: Secondary | ICD-10-CM | POA: Diagnosis not present

## 2014-09-19 DIAGNOSIS — E785 Hyperlipidemia, unspecified: Secondary | ICD-10-CM | POA: Diagnosis not present

## 2014-09-19 DIAGNOSIS — IMO0001 Reserved for inherently not codable concepts without codable children: Secondary | ICD-10-CM

## 2014-09-19 DIAGNOSIS — G8929 Other chronic pain: Secondary | ICD-10-CM | POA: Insufficient documentation

## 2014-09-19 DIAGNOSIS — Z9079 Acquired absence of other genital organ(s): Secondary | ICD-10-CM | POA: Diagnosis not present

## 2014-09-19 DIAGNOSIS — K219 Gastro-esophageal reflux disease without esophagitis: Secondary | ICD-10-CM | POA: Diagnosis present

## 2014-09-19 DIAGNOSIS — R0600 Dyspnea, unspecified: Secondary | ICD-10-CM | POA: Diagnosis not present

## 2014-09-19 DIAGNOSIS — E669 Obesity, unspecified: Secondary | ICD-10-CM | POA: Diagnosis present

## 2014-09-19 DIAGNOSIS — I10 Essential (primary) hypertension: Secondary | ICD-10-CM | POA: Diagnosis present

## 2014-09-19 DIAGNOSIS — E1169 Type 2 diabetes mellitus with other specified complication: Secondary | ICD-10-CM

## 2014-09-19 DIAGNOSIS — Z0389 Encounter for observation for other suspected diseases and conditions ruled out: Secondary | ICD-10-CM

## 2014-09-19 DIAGNOSIS — I493 Ventricular premature depolarization: Secondary | ICD-10-CM | POA: Diagnosis not present

## 2014-09-19 LAB — CBC WITH DIFFERENTIAL/PLATELET
Basophils Absolute: 0 10*3/uL (ref 0.0–0.1)
Basophils Relative: 0 % (ref 0–1)
EOS ABS: 0.2 10*3/uL (ref 0.0–0.7)
Eosinophils Relative: 4 % (ref 0–5)
HCT: 41.4 % (ref 36.0–46.0)
HEMOGLOBIN: 13.7 g/dL (ref 12.0–15.0)
LYMPHS ABS: 1.9 10*3/uL (ref 0.7–4.0)
LYMPHS PCT: 30 % (ref 12–46)
MCH: 28.1 pg (ref 26.0–34.0)
MCHC: 33.1 g/dL (ref 30.0–36.0)
MCV: 84.8 fL (ref 78.0–100.0)
MONOS PCT: 6 % (ref 3–12)
Monocytes Absolute: 0.4 10*3/uL (ref 0.1–1.0)
NEUTROS ABS: 3.8 10*3/uL (ref 1.7–7.7)
Neutrophils Relative %: 60 % (ref 43–77)
Platelets: 180 10*3/uL (ref 150–400)
RBC: 4.88 MIL/uL (ref 3.87–5.11)
RDW: 14.4 % (ref 11.5–15.5)
WBC: 6.3 10*3/uL (ref 4.0–10.5)

## 2014-09-19 LAB — CBC
HCT: 39.5 % (ref 36.0–46.0)
Hemoglobin: 13.4 g/dL (ref 12.0–15.0)
MCH: 28.6 pg (ref 26.0–34.0)
MCHC: 33.9 g/dL (ref 30.0–36.0)
MCV: 84.4 fL (ref 78.0–100.0)
PLATELETS: 177 10*3/uL (ref 150–400)
RBC: 4.68 MIL/uL (ref 3.87–5.11)
RDW: 14.3 % (ref 11.5–15.5)
WBC: 6.2 10*3/uL (ref 4.0–10.5)

## 2014-09-19 LAB — BASIC METABOLIC PANEL
ANION GAP: 7 (ref 5–15)
BUN: 15 mg/dL (ref 6–23)
CHLORIDE: 105 mmol/L (ref 96–112)
CO2: 27 mmol/L (ref 19–32)
Calcium: 9.6 mg/dL (ref 8.4–10.5)
Creatinine, Ser: 0.8 mg/dL (ref 0.50–1.10)
GFR calc non Af Amer: 77 mL/min — ABNORMAL LOW (ref >90)
GFR, EST AFRICAN AMERICAN: 89 mL/min — AB (ref >90)
Glucose, Bld: 113 mg/dL — ABNORMAL HIGH (ref 70–99)
POTASSIUM: 4.3 mmol/L (ref 3.5–5.1)
Sodium: 139 mmol/L (ref 135–145)

## 2014-09-19 LAB — CREATININE, SERUM
CREATININE: 0.8 mg/dL (ref 0.50–1.10)
GFR calc Af Amer: 89 mL/min — ABNORMAL LOW (ref >90)
GFR calc non Af Amer: 77 mL/min — ABNORMAL LOW (ref >90)

## 2014-09-19 LAB — GLUCOSE, CAPILLARY
GLUCOSE-CAPILLARY: 117 mg/dL — AB (ref 70–99)
Glucose-Capillary: 133 mg/dL — ABNORMAL HIGH (ref 70–99)

## 2014-09-19 LAB — TROPONIN I: Troponin I: <0.03 ng/mL (ref <0.031)

## 2014-09-19 MED ORDER — ASPIRIN 81 MG PO CHEW
324.0000 mg | CHEWABLE_TABLET | ORAL | Status: AC
  Administered 2014-09-19: 324 mg via ORAL
  Filled 2014-09-19: qty 4

## 2014-09-19 MED ORDER — ONDANSETRON HCL 4 MG/2ML IJ SOLN: 4.0000 mg | Freq: Four times a day (QID) | INTRAMUSCULAR | Status: DC | PRN

## 2014-09-19 MED ORDER — ENOXAPARIN SODIUM 40 MG/0.4ML ~~LOC~~ SOLN
40.0000 mg | SUBCUTANEOUS | Status: DC
  Filled 2014-09-19 (×2): qty 0.4

## 2014-09-19 MED ORDER — BUSPIRONE HCL 10 MG PO TABS
10.0000 mg | ORAL_TABLET | Freq: Two times a day (BID) | ORAL | Status: DC | PRN
  Filled 2014-09-19: qty 1

## 2014-09-19 MED ORDER — ACETAMINOPHEN 325 MG PO TABS: 650.0000 mg | ORAL_TABLET | ORAL | Status: DC | PRN

## 2014-09-19 MED ORDER — NITROGLYCERIN 0.4 MG SL SUBL: 0.4000 mg | SUBLINGUAL_TABLET | SUBLINGUAL | Status: DC | PRN

## 2014-09-19 MED ORDER — SODIUM CHLORIDE 0.9 % IV SOLN: 250.0000 mL | INTRAVENOUS | Status: DC | PRN

## 2014-09-19 MED ORDER — PANTOPRAZOLE SODIUM 40 MG PO TBEC
40.0000 mg | DELAYED_RELEASE_TABLET | Freq: Two times a day (BID) | ORAL | Status: DC
  Administered 2014-09-19 – 2014-09-20 (×2): 40 mg via ORAL
  Filled 2014-09-19 (×2): qty 1

## 2014-09-19 MED ORDER — PRAVASTATIN SODIUM 40 MG PO TABS
40.0000 mg | ORAL_TABLET | Freq: Every evening | ORAL | Status: DC
  Administered 2014-09-19: 40 mg via ORAL
  Filled 2014-09-19 (×2): qty 1

## 2014-09-19 MED ORDER — TIZANIDINE HCL 4 MG PO TABS
4.0000 mg | ORAL_TABLET | Freq: Every day | ORAL | Status: DC | PRN
  Filled 2014-09-19: qty 2

## 2014-09-19 MED ORDER — METOPROLOL TARTRATE 12.5 MG HALF TABLET
12.5000 mg | ORAL_TABLET | Freq: Two times a day (BID) | ORAL | Status: DC
Start: 2014-09-19 — End: 2014-09-20
  Administered 2014-09-19 – 2014-09-20 (×2): 12.5 mg via ORAL
  Filled 2014-09-19 (×3): qty 1

## 2014-09-19 MED ORDER — LISINOPRIL 40 MG PO TABS
40.0000 mg | ORAL_TABLET | Freq: Every day | ORAL | Status: DC
  Administered 2014-09-20: 40 mg via ORAL
  Filled 2014-09-19: qty 1

## 2014-09-19 MED ORDER — HYDROCODONE-ACETAMINOPHEN 10-325 MG PO TABS: 1.0000 | ORAL_TABLET | Freq: Four times a day (QID) | ORAL | Status: DC | PRN

## 2014-09-19 MED ORDER — DICLOFENAC SODIUM 75 MG PO TBEC
75.0000 mg | DELAYED_RELEASE_TABLET | Freq: Two times a day (BID) | ORAL | Status: DC
  Filled 2014-09-19 (×3): qty 1

## 2014-09-19 MED ORDER — SODIUM CHLORIDE 0.9 % IJ SOLN
3.0000 mL | Freq: Two times a day (BID) | INTRAMUSCULAR | Status: DC
Start: 2014-09-19 — End: 2014-09-20
  Administered 2014-09-19: 3 mL via INTRAVENOUS

## 2014-09-19 MED ORDER — ALPRAZOLAM 0.5 MG PO TABS
1.0000 mg | ORAL_TABLET | Freq: Every day | ORAL | Status: DC | PRN
Start: 2014-09-19 — End: 2014-09-20

## 2014-09-19 MED ORDER — LORCASERIN HCL 10 MG PO TABS: 1.0000 | ORAL_TABLET | Freq: Two times a day (BID) | ORAL | Status: DC

## 2014-09-19 MED ORDER — ASPIRIN 81 MG PO CHEW: 324.0000 mg | CHEWABLE_TABLET | Freq: Once | ORAL | Status: DC

## 2014-09-19 MED ORDER — IBUPROFEN 400 MG PO TABS
600.0000 mg | ORAL_TABLET | Freq: Once | ORAL | Status: AC
  Administered 2014-09-19: 600 mg via ORAL
  Filled 2014-09-19 (×2): qty 1

## 2014-09-19 MED ORDER — ASPIRIN 300 MG RE SUPP: 300.0000 mg | RECTAL | Status: AC

## 2014-09-19 MED ORDER — ASPIRIN 81 MG PO TABS: 81.0000 mg | ORAL_TABLET | Freq: Every day | ORAL | Status: DC

## 2014-09-19 MED ORDER — SODIUM CHLORIDE 0.9 % IJ SOLN: 3.0000 mL | INTRAMUSCULAR | Status: DC | PRN

## 2014-09-19 MED ORDER — ASPIRIN EC 81 MG PO TBEC
81.0000 mg | DELAYED_RELEASE_TABLET | Freq: Every day | ORAL | Status: DC
  Administered 2014-09-20: 81 mg via ORAL
  Filled 2014-09-19: qty 1

## 2014-09-19 NOTE — ED Notes (Signed)
PA at bedside.  Family at bedside.

## 2014-09-19 NOTE — ED Notes (Signed)
Pt to ED via [facility] from [facility] with hx of chet pain episode this am, relieved with 1 NTG at dr's office. Pt has a hx of same. On arrival, a/o x4, w/d, denies any pain at present.

## 2014-09-19 NOTE — ED Notes (Signed)
Attempted report x1. 

## 2014-09-19 NOTE — Consult Note (Signed)
Reason for Consult:   Chest pain  Requesting Physician: Mary Sella  HPI: This is a 64 y.o. female with a past medical history significant for HTN, DM, dyslipidemia, obesity, IBS, and two priorcaths revealing insignificant CAD in 2009 and 2014. The pt was at her PCP's office today for routine f/u when she developed SSCP "like an elephant on my chest". An EKG was done showing PVCs and she was sent to [facility]. She is pain free now. She admits she had a similar episode last night that resolved spontaneously after 20 minutes.   PMHx:  Past Medical History  Diagnosis Date  . Hypertension   . Diabetes mellitus type II, controlled     Diet  . Hyperlipidemia   . GERD (gastroesophageal reflux disease)   . Myocardial infarct   . Neuropathy, peripheral   . Asthma   . Arrhythmia     Heart  . Arthritis   . Chronic headaches   . Kidney stones   . Urinary tract infection   . Ulcer   . Lymphocytic colitis   . Adenomatous colon polyp 2006  . Gastric polyp     hyperplastic  . Headache   . IBS (irritable bowel syndrome)   . Sleep apnea     no CPAP    Past Surgical History  Procedure Laterality Date  . Back surgery    . Abdominal hysterectomy    . Bladder repair      x2  . Cholecystectomy  2000  . Left heart catheterization with coronary angiogram N/A 08/08/2012    Procedure: LEFT HEART CATHETERIZATION WITH CORONARY ANGIOGRAM;  Surgeon: Lorretta Harp, MD;  Location: [name] CATH LAB;  Service: Cardiovascular;  Laterality: N/A;  . Abdominal hysterectomy      SOCHx:  reports that she has never smoked. She has never used smokeless tobacco. She reports that she does not drink alcohol or use illicit drugs.  FAMHx: Family History  Problem Relation Age of Onset  . Colon cancer Paternal Uncle   . Hypertension Mother   . Esophageal cancer Neg Hx   . Stomach cancer Neg Hx   . Rectal cancer Neg Hx     ALLERGIES: Allergies  Allergen Reactions  . Penicillins Anaphylaxis and  Swelling  . Shellfish-Derived Products Anaphylaxis and Swelling  . Sulfa Antibiotics Anaphylaxis and Swelling  . Morphine And Related Nausea And Vomiting    ROS: Pertinent items are noted in HPI. See H&P for complete ROS Under a lot of stress- caring for sick mother   HOME MEDICATIONS: Prior to Admission medications   Medication Sig Start Date End Date Taking? Authorizing Provider  ALPRAZolam Duanne Moron) 1 MG tablet Take 1 mg by mouth daily as needed. For anxiety   Yes Historical Provider, MD  aspirin 81 MG tablet Take 81 mg by mouth daily.    Yes Historical Provider, MD  busPIRone (BUSPAR) 10 MG tablet Take 10 mg by mouth 2 (two) times daily as needed. For anxiety   Yes Historical Provider, MD  Cholecalciferol (VITAMIN D) 2000 UNITS tablet Take 2,000 Units by mouth daily.    Yes Historical Provider, MD  diclofenac (VOLTAREN) 75 MG EC tablet Take 75 mg by mouth 2 (two) times daily.   Yes Historical Provider, MD  furosemide (LASIX) 20 MG tablet Take 20 mg by mouth daily as needed. For swelling   Yes Historical Provider, MD  HYDROcodone-acetaminophen (NORCO) 10-325 MG per tablet Take 1 tablet by mouth every  6 (six) hours as needed. For pain   Yes Historical Provider, MD  lisinopril (PRINIVIL,ZESTRIL) 10 MG tablet Take 20 mg by mouth daily.    Yes Historical Provider, MD  Lorcaserin HCl (BELVIQ) 10 MG TABS Take 1 tablet by mouth 2 (two) times daily.   Yes Historical Provider, MD  metFORMIN (GLUCOPHAGE) 500 MG tablet Take by mouth daily.   Yes Historical Provider, MD  NITROSTAT 0.4 MG SL tablet Place 1 tablet under the tongue every 5 (five) minutes as needed. 07/21/14  Yes Historical Provider, MD  omeprazole (PRILOSEC) 20 MG capsule TAKE ONE CAPSULE BY MOUTH TWICE A DAY 05/07/12  Yes Lafayette Dragon, MD  pravastatin (PRAVACHOL) 40 MG tablet Take 40 mg by mouth daily.   Yes Historical Provider, MD  tiZANidine (ZANAFLEX) 4 MG capsule Take 4-8 mg by mouth daily as needed. For muscle pain   Yes  Historical Provider, MD  albuterol (PROVENTIL HFA;VENTOLIN HFA) 108 (90 BASE) MCG/ACT inhaler Inhale 1-2 puffs into the lungs every 6 (six) hours as needed for wheezing or shortness of breath. 06/13/14   Kaitlyn Szekalski, PA-C  azithromycin (ZITHROMAX Z-PAK) 250 MG tablet Take 1 tablet (250 mg total) by mouth daily. 500mg  PO day 1, then 250mg  PO days 205 06/13/14   Alvina Chou, [facility] MEDICATIONS: I have reviewed the patient's current medications.  VITALS: Blood pressure 154/98, pulse 77, temperature 97.7 F (36.5 C), temperature source Oral, resp. rate 18, height 5\' 5"  (1.651 m), weight 216 lb (97.977 kg), SpO2 99 %.  PHYSICAL EXAM: General appearance: alert, cooperative, no distress and morbidly obese Neck: no carotid bruit and no JVD Lungs: clear to auscultation bilaterally Heart: regular rate and rhythm Abdomen: obese Extremities: extremities normal, atraumatic, no cyanosis or edema Pulses: 2+ and symmetric Skin: Skin color, texture, turgor normal. No rashes or lesions Neurologic: Grossly normal  LABS: Results for orders placed or performed during the hospital encounter of 09/19/14 (from the past 24 hour(s))  Troponin I     Status: None   Collection Time: 09/19/14  2:15 PM  Result Value Ref Range   Troponin I <0.03 <0.031 ng/mL  Basic metabolic panel     Status: Abnormal   Collection Time: 09/19/14  2:15 PM  Result Value Ref Range   Sodium 139 135 - 145 mmol/L   Potassium 4.3 3.5 - 5.1 mmol/L   Chloride 105 96 - 112 mmol/L   CO2 27 19 - 32 mmol/L   Glucose, Bld 113 (H) 70 - 99 mg/dL   BUN 15 6 - 23 mg/dL   Creatinine, Ser 0.80 0.50 - 1.10 mg/dL   Calcium 9.6 8.4 - 10.5 mg/dL   GFR calc non Af Amer 77 (L) >90 mL/min   GFR calc Af Amer 89 (L) >90 mL/min   Anion gap 7 5 - 15  CBC with Differential     Status: None   Collection Time: 09/19/14  2:15 PM  Result Value Ref Range   WBC 6.3 4.0 - 10.5 K/uL   RBC 4.88 3.87 - 5.11 MIL/uL   Hemoglobin 13.7 12.0  - 15.0 g/dL   HCT 41.4 36.0 - 46.0 %   MCV 84.8 78.0 - 100.0 fL   MCH 28.1 26.0 - 34.0 pg   MCHC 33.1 30.0 - 36.0 g/dL   RDW 14.4 11.5 - 15.5 %   Platelets 180 150 - 400 K/uL   Neutrophils Relative % 60 43 - 77 %   Neutro Abs 3.8 1.7 -  7.7 K/uL   Lymphocytes Relative 30 12 - 46 %   Lymphs Abs 1.9 0.7 - 4.0 K/uL   Monocytes Relative 6 3 - 12 %   Monocytes Absolute 0.4 0.1 - 1.0 K/uL   Eosinophils Relative 4 0 - 5 %   Eosinophils Absolute 0.2 0.0 - 0.7 K/uL   Basophils Relative 0 0 - 1 %   Basophils Absolute 0.0 0.0 - 0.1 K/uL  Glucose, capillary     Status: Abnormal   Collection Time: 09/19/14  5:55 PM  Result Value Ref Range   Glucose-Capillary 117 (H) 70 - 99 mg/dL    EKG: NSR PVCs  IMAGING: Dg Chest 2 View  09/19/2014   CLINICAL DATA:  Shortness breast and chest pain.  EXAM: CHEST - 2 VIEW  COMPARISON:  Two-view chest 06/13/2014.  FINDINGS: The heart size is normal. Atherosclerotic calcifications are present at the arch. The lungs are clear. The visualized soft tissues and bony thorax are unremarkable. Surgical clips are present at the gallbladder fossa.  IMPRESSION: 1. No acute cardiopulmonary disease. 2. Atherosclerosis.   Electronically Signed   By: San Morelle M.D.   On: 09/19/2014 14:05    IMPRESSION: Principal Problem:   Chest pain Active Problems:   HTN (hypertension)   Diabetes mellitus type 2 in obese   Normal coronary arteries-2009, 2014   GERD (gastroesophageal reflux disease)   Obesity BMI 36   HLD (hyperlipidemia)   Sleep apnea-C pap intol   RECOMMENDATION: MD to see  Time Spent Directly with Patient: Sloan Leiter [phone] beeper 09/19/2014, 6:44 PM    I have seen and examined the patient along with KILROY,LUKE K.  I have reviewed the chart, notes and new data.  I agree with PA's note.  Key new complaints: currently symptom free Key examination changes: normal exam, no ectopy at this time Key new findings / data: very  frequent monomorphic PVCs, often in bigeminy/trigeminy and couplets  PLAN: Unclear if there is any relationship between PVCs and symptoms, let alone a causal one. ECGand biomarkers do not support a diagnosis of acute coronary syndrome. If she remains symptom free and repeat labs OK, probably well suited for outpatient workup. May consider repeating the nuclear scan, preferably in same location as in 2014 since she had a "false positive study"and direct comparison would be beneficial.  Sanda Klein, MD, [facility] [phone] 09/19/2014, 7:25 PM

## 2014-09-19 NOTE — ED Provider Notes (Signed)
CSN: [id number]     Arrival date & time 09/19/14  1310 History   First MD Initiated Contact with Patient 09/19/14 1310     Chief Complaint  Patient presents with  . Chest Pain     (Consider location/radiation/quality/duration/timing/severity/associated sxs/prior Treatment) HPI Comments: Patient is a 64 year old female past medical history significant for hypertension, hyperlipidemia, DM, GERD presenting to the emergency department from her PCP office for evaluation of acute onset central chest has been intermittent over the last day. Patient states her pain became acutely worse around 10:30 AM this morning while at her PCPs office. She states her pain is improved with sublingual nitroglycerin administration. She endorses she had associated shortness of breath with her chest pain. States she has had lesser/different chest tightness for the last several weeks. Endorses similar history of CP in the past. Cardiac catheterization performed in 2014 with 30% stenosis.   Patient is a 64 y.o. female presenting with chest pain.  Chest Pain Associated symptoms: shortness of breath     Past Medical History  Diagnosis Date  . Hypertension   . Diabetes mellitus type II, controlled     Diet  . Hyperlipidemia   . GERD (gastroesophageal reflux disease)   . Myocardial infarct   . Neuropathy, peripheral   . Asthma   . Arrhythmia     Heart  . Arthritis   . Chronic headaches   . Kidney stones   . Urinary tract infection   . Ulcer   . Lymphocytic colitis   . Adenomatous colon polyp 2006  . Gastric polyp     hyperplastic  . Headache   . IBS (irritable bowel syndrome)   . Sleep apnea     no CPAP   Past Surgical History  Procedure Laterality Date  . Back surgery    . Abdominal hysterectomy    . Bladder repair      x2  . Cholecystectomy  2000  . Left heart catheterization with coronary angiogram N/A 08/08/2012    Procedure: LEFT HEART CATHETERIZATION WITH CORONARY ANGIOGRAM;  Surgeon: Lorretta Harp, MD;  Location: [facility] CATH LAB;  Service: Cardiovascular;  Laterality: N/A;  . Abdominal hysterectomy     Family History  Problem Relation Age of Onset  . Colon cancer Paternal Uncle   . Hypertension Mother   . Esophageal cancer Neg Hx   . Stomach cancer Neg Hx   . Rectal cancer Neg Hx    History  Substance Use Topics  . Smoking status: Never Smoker   . Smokeless tobacco: Never Used  . Alcohol Use: No   OB History    No data available     Review of Systems  Respiratory: Positive for shortness of breath.   Cardiovascular: Positive for chest pain.  All other systems reviewed and are negative.     Allergies  Penicillins; Shellfish-derived products; Sulfa antibiotics; and Morphine and related  Home Medications   Prior to Admission medications   Medication Sig Start Date End Date Taking? Authorizing Provider  albuterol (PROVENTIL HFA;VENTOLIN HFA) 108 (90 BASE) MCG/ACT inhaler Inhale 1-2 puffs into the lungs every 6 (six) hours as needed for wheezing or shortness of breath. 06/13/14   Kaitlyn Szekalski, PA-C  ALPRAZolam (XANAX) 1 MG tablet Take 1 mg by mouth daily as needed. For anxiety    Historical Provider, MD  aspirin 81 MG tablet Take 81 mg by mouth daily.     Historical Provider, MD  azithromycin (ZITHROMAX Z-PAK) 250 MG tablet Take  1 tablet (250 mg total) by mouth daily. 500mg  PO day 1, then 250mg  PO days 205 06/13/14   Kaitlyn Szekalski, PA-C  busPIRone (BUSPAR) 10 MG tablet Take 10 mg by mouth 2 (two) times daily as needed. For anxiety    Historical Provider, MD  Cholecalciferol (VITAMIN D) 2000 UNITS tablet Take 2,000 Units by mouth daily.     Historical Provider, MD  furosemide (LASIX) 20 MG tablet Take 20 mg by mouth as needed. For swelling    Historical Provider, MD  HYDROcodone-acetaminophen (NORCO) 10-325 MG per tablet Take 1 tablet by mouth every 6 (six) hours as needed. For pain    Historical Provider, MD  Linaclotide (LINZESS) 145 MCG CAPS capsule Take  145 mcg by mouth daily.    Historical Provider, MD  lisinopril (PRINIVIL,ZESTRIL) 10 MG tablet Take 10 mg by mouth daily.     Historical Provider, MD  Lorcaserin HCl (BELVIQ) 10 MG TABS Take 1 tablet by mouth 2 (two) times daily.    Historical Provider, MD  metFORMIN (GLUCOPHAGE) 500 MG tablet Take by mouth daily.    Historical Provider, MD  omeprazole (PRILOSEC) 20 MG capsule TAKE ONE CAPSULE BY MOUTH TWICE A DAY 05/07/12   Lafayette Dragon, MD  pravastatin (PRAVACHOL) 40 MG tablet Take 40 mg by mouth daily.    Historical Provider, MD  tiZANidine (ZANAFLEX) 4 MG capsule Take 4-8 mg by mouth daily as needed. For muscle pain    Historical Provider, MD   BP 145/76 mmHg  Pulse 82  Temp(Src) 97.9 F (36.6 C) (Oral)  Resp 24  SpO2 98% Physical Exam  Constitutional: She is oriented to person, place, and time. She appears well-developed and well-nourished. No distress.  HENT:  Head: Normocephalic and atraumatic.  Right Ear: External ear normal.  Left Ear: External ear normal.  Nose: Nose normal.  Mouth/Throat: Oropharynx is clear and moist. No oropharyngeal exudate.  Eyes: Conjunctivae are normal.  Neck: Normal range of motion. Neck supple.  Cardiovascular: Normal rate, regular rhythm and normal heart sounds.   Pulmonary/Chest: Effort normal and breath sounds normal. No respiratory distress.  Abdominal: Soft. There is no tenderness.  Musculoskeletal: Normal range of motion. She exhibits no edema.  Neurological: She is alert and oriented to person, place, and time.  Skin: Skin is warm and dry. She is not diaphoretic.  Psychiatric: She has a normal mood and affect.  Nursing note and vitals reviewed.   ED Course  Procedures (including critical care time) Medications  nitroGLYCERIN (NITROSTAT) SL tablet 0.4 mg (not administered)  aspirin chewable tablet 324 mg (324 mg Oral Not Given 09/19/14 1417)  ibuprofen (ADVIL,MOTRIN) tablet 600 mg (not administered)    Labs Review Labs Reviewed   BASIC METABOLIC PANEL - Abnormal; Notable for the following:    Glucose, Bld 113 (*)    GFR calc non Af Amer 77 (*)    GFR calc Af Amer 89 (*)    All other components within normal limits  TROPONIN I  CBC WITH DIFFERENTIAL/PLATELET    Imaging Review Dg Chest 2 View  09/19/2014   CLINICAL DATA:  Shortness breast and chest pain.  EXAM: CHEST - 2 VIEW  COMPARISON:  Two-view chest 06/13/2014.  FINDINGS: The heart size is normal. Atherosclerotic calcifications are present at the arch. The lungs are clear. The visualized soft tissues and bony thorax are unremarkable. Surgical clips are present at the gallbladder fossa.  IMPRESSION: 1. No acute cardiopulmonary disease. 2. Atherosclerosis.   Electronically Signed  By: San Morelle M.D.   On: 09/19/2014 14:05     EKG Interpretation None      Cardiology consulted for hospitalist.  MDM   Final diagnoses:  Chest pain, unspecified chest pain type    Filed Vitals:   09/19/14 1449  BP: 145/76  Pulse: 82  Temp: 97.9 F (36.6 C)  Resp: 24   Afebrile, NAD, non-toxic appearing, AAOx4.  Concern for cardiac etiology of Chest Pain. Hospitalist has been consulted and will see patient in the ED for likely admit. Pt does not meet criteria for CP protocol and a further evaluation is recommended. Pt has been re-evaluated prior to consult and VSS, NAD, heart RRR, pain 0/10, lungs CTAB. No acute abnormalities found on EKG and first round of cardiac enzymes negative. This case was discussed with Dr. Regenia Skeeter who has seen the patient and agrees with plan to admit.      Baron Sane, PA-C 09/19/14 1723  Sherwood Gambler, MD 09/22/14 1316

## 2014-09-19 NOTE — ED Notes (Signed)
Family sitting with patient 

## 2014-09-19 NOTE — H&P (Signed)
[facility] History and Physical  SHEILY LINEMAN JJK:[id number] DOB: 1951/05/10 DOA: 09/19/2014   PCP: Secundino Ginger, PA-C    Chief Complaint: chest pain  HPI: [name] is a 64 y.o. female with HTN, HLD, DM 2, GERD comes in for chest pressure. She states that she had been doing some heavy moving, lifted a couple of days ago and her left arm and leg were hurting. Today she wen to see her PCP and prior to going into the office had severe nausea and dyspnea in the car. Once in the office this was accompanied by a heavy feeling in her chest as if an elephant was sitting on her and radiated to the left arm. She was given a Nitro and the pressure resolved immediately. She states an EKG was done and showed funny beats. In the ER she remains pain free.    General: The patient denies anorexia, fever, weight loss Cardiac: Denies chest pain, syncope, palpitations, pedal edema  Respiratory: Denies cough, shortness of breath, wheezing GI: Denies severe indigestion/heartburn, abdominal pain, vomiting, diarrhea and constipation- has been having nausea today even prior to chest pain- it was the worst just prior to the chest pain,  GU: Denies hematuria, incontinence, dysuria  Musculoskeletal: Denies arthritis  Skin: Denies suspicious skin lesions- had shingles 2 wks ago Neurologic: Denies focal weakness or numbness, change in vision Psychiatry: Denies depression or anxiety Hematologic: no easy bruising or bleeding   Past Medical History  Diagnosis Date  . Hypertension   . Diabetes mellitus type II, controlled     Diet  . Hyperlipidemia   . GERD (gastroesophageal reflux disease)   . Myocardial infarct   . Neuropathy, peripheral   . Asthma   . Arrhythmia     Heart  . Arthritis   . Chronic headaches   . Kidney stones   . Urinary tract infection   . Ulcer   . Lymphocytic colitis   . Adenomatous colon polyp 2006  . Gastric polyp     hyperplastic  . Headache   . IBS (irritable  bowel syndrome)   . Sleep apnea     no CPAP    Past Surgical History  Procedure Laterality Date  . Back surgery    . Abdominal hysterectomy    . Bladder repair      x2  . Cholecystectomy  2000  . Left heart catheterization with coronary angiogram N/A 08/08/2012    Procedure: LEFT HEART CATHETERIZATION WITH CORONARY ANGIOGRAM;  Surgeon: Lorretta Harp, MD;  Location: [name] CATH LAB;  Service: Cardiovascular;  Laterality: N/A;  . Abdominal hysterectomy      Social History: never smoked- does not drink alcohol Lives at home with husband - on disability for depression and now back issues   Allergies  Allergen Reactions  . Penicillins Anaphylaxis and Swelling  . Shellfish-Derived Products Anaphylaxis and Swelling  . Sulfa Antibiotics Anaphylaxis and Swelling  . Morphine And Related Nausea And Vomiting    Family History  Problem Relation Age of Onset  . Colon cancer Paternal Uncle   . Hypertension Mother   . Esophageal cancer Neg Hx   . Stomach cancer Neg Hx   . Rectal cancer Neg Hx      Prior to Admission medications   Medication Sig Start Date End Date Taking? Authorizing Provider  ALPRAZolam Duanne Moron) 1 MG tablet Take 1 mg by mouth daily as needed. For anxiety   Yes Historical Provider, MD  aspirin 81 MG tablet Take  81 mg by mouth daily.    Yes Historical Provider, MD  busPIRone (BUSPAR) 10 MG tablet Take 10 mg by mouth 2 (two) times daily as needed. For anxiety   Yes Historical Provider, MD  Cholecalciferol (VITAMIN D) 2000 UNITS tablet Take 2,000 Units by mouth daily.    Yes Historical Provider, MD  diclofenac (VOLTAREN) 75 MG EC tablet Take 75 mg by mouth 2 (two) times daily.   Yes Historical Provider, MD  furosemide (LASIX) 20 MG tablet Take 20 mg by mouth daily as needed. For swelling   Yes Historical Provider, MD  HYDROcodone-acetaminophen (NORCO) 10-325 MG per tablet Take 1 tablet by mouth every 6 (six) hours as needed. For pain   Yes Historical Provider, MD  lisinopril  (PRINIVIL,ZESTRIL) 10 MG tablet Take 20 mg by mouth daily.    Yes Historical Provider, MD  Lorcaserin HCl (BELVIQ) 10 MG TABS Take 1 tablet by mouth 2 (two) times daily.   Yes Historical Provider, MD  metFORMIN (GLUCOPHAGE) 500 MG tablet Take by mouth daily.   Yes Historical Provider, MD  NITROSTAT 0.4 MG SL tablet Place 1 tablet under the tongue every 5 (five) minutes as needed. 07/21/14  Yes Historical Provider, MD  omeprazole (PRILOSEC) 20 MG capsule TAKE ONE CAPSULE BY MOUTH TWICE A DAY 05/07/12  Yes Lafayette Dragon, MD  pravastatin (PRAVACHOL) 40 MG tablet Take 40 mg by mouth daily.   Yes Historical Provider, MD  tiZANidine (ZANAFLEX) 4 MG capsule Take 4-8 mg by mouth daily as needed. For muscle pain   Yes Historical Provider, MD  albuterol (PROVENTIL HFA;VENTOLIN HFA) 108 (90 BASE) MCG/ACT inhaler Inhale 1-2 puffs into the lungs every 6 (six) hours as needed for wheezing or shortness of breath. 06/13/14   Kaitlyn Szekalski, PA-C  azithromycin (ZITHROMAX Z-PAK) 250 MG tablet Take 1 tablet (250 mg total) by mouth daily. 500mg  PO day 1, then 250mg  PO days 205 06/13/14   Alvina Chou, PA-C     Physical Exam: Filed Vitals:   09/19/14 1449 09/19/14 1500 09/19/14 1515 09/19/14 1530  BP: 145/76 141/67 147/74 118/68  Pulse: 82     Temp: 97.9 F (36.6 C)     TempSrc: Oral     Resp: 24 18 15 18   SpO2: 98% 99% 100% 98%     General: AAO x3  HEENT: Normocephalic and Atraumatic, Mucous membranes pink                PERRLA; EOM intact; No scleral icterus,                 Nares: Patent, Oropharynx: Clear, Fair Dentition                 Neck: FROM, no cervical lymphadenopathy, thyromegaly, carotid bruit or JVD;  Breasts: deferred CHEST WALL: No tenderness  CHEST: Normal respiration, clear to auscultation bilaterally  HEART: Regular rate and rhythm; no murmurs rubs or gallops  BACK: No kyphosis or scoliosis; no CVA tenderness  GI: Positive Bowel Sounds, soft, non-tender; no masses, no  organomegaly Rectal Exam: deferred MSK: No cyanosis, clubbing, or edema Genitalia: not examined  SKIN:  no rash or ulceration  CNS: Alert and Oriented x 4, Nonfocal exam, CN 2-12 intact  Labs on Admission:  Basic Metabolic Panel:  Recent Labs Lab 09/19/14 1415  NA 139  K 4.3  CL 105  CO2 27  GLUCOSE 113*  BUN 15  CREATININE 0.80  CALCIUM 9.6   Liver Function Tests: No results  for input(s): AST, ALT, ALKPHOS, BILITOT, PROT, ALBUMIN in the last 168 hours. No results for input(s): LIPASE, AMYLASE in the last 168 hours. No results for input(s): AMMONIA in the last 168 hours. CBC:  Recent Labs Lab 09/19/14 1415  WBC 6.3  NEUTROABS 3.8  HGB 13.7  HCT 41.4  MCV 84.8  PLT 180   Cardiac Enzymes:  Recent Labs Lab 09/19/14 1415  TROPONINI <0.03    BNP (last 3 results) No results for input(s): BNP in the last 8760 hours.  ProBNP (last 3 results) No results for input(s): PROBNP in the last 8760 hours.  CBG: No results for input(s): GLUCAP in the last 168 hours.  Radiological Exams on Admission: Dg Chest 2 View  09/19/2014   CLINICAL DATA:  Shortness breast and chest pain.  EXAM: CHEST - 2 VIEW  COMPARISON:  Two-view chest 06/13/2014.  FINDINGS: The heart size is normal. Atherosclerotic calcifications are present at the arch. The lungs are clear. The visualized soft tissues and bony thorax are unremarkable. Surgical clips are present at the gallbladder fossa.  IMPRESSION: 1. No acute cardiopulmonary disease. 2. Atherosclerosis.   Electronically Signed   By: San Morelle M.D.   On: 09/19/2014 14:05    EKG: Independently reviewed. Sinus rhythm with trigeminy  Assessment/Plan Principal Problem:   Angina pectoris - highly suspicious for cardiac pain- have asked ER PA go request a cardiology consult- cont ASA and add Metoprolol and PRN Nitro - check Troponin - EKG reveals Trigeminy  Active Problems:   GERD (gastroesophageal reflux disease) - cont BID  PPI    HTN (hypertension) - cont Lisinopril- she states dose is actually 40 mg rather than the 20 mentioned in our med rec    HLD (hyperlipidemia) -cont Pravastatin  Chronic pain - mostly takes the diclofenac and the Hydrocodone  Obesity - cont Belviq    Consulted: cardiology  Code Status: full code Family Communication: husband  DVT Prophylaxis:Lovenox  Time spent: 7 min  [place], MD [facility]  If 7PM-7AM, please contact night-coverage [email or web address] 09/19/2014, 4:22 PM

## 2014-09-20 DIAGNOSIS — R072 Precordial pain: Secondary | ICD-10-CM | POA: Diagnosis not present

## 2014-09-20 DIAGNOSIS — E119 Type 2 diabetes mellitus without complications: Secondary | ICD-10-CM

## 2014-09-20 DIAGNOSIS — E669 Obesity, unspecified: Secondary | ICD-10-CM

## 2014-09-20 LAB — TROPONIN I

## 2014-09-20 LAB — GLUCOSE, CAPILLARY: Glucose-Capillary: 106 mg/dL — ABNORMAL HIGH (ref 70–99)

## 2014-09-20 NOTE — Discharge Summary (Signed)
Discharge Summary  [name] HWE:[id number] DOB: Nov 19, 1962  PCP: Secundino Ginger, PA-C  Admit date: 09/19/2014 Discharge date: 09/20/2014  Time spent: 25 minutes  Recommendations for Outpatient Follow-up:  1. Cardiology clinic will call patient on Monday 3/21 for post hospital follow-up and at that time further outpatient testing can be decided.  Discharge Diagnoses:  Active Hospital Problems   Diagnosis Date Noted  . Chest pain 09/19/2014  . Normal coronary arteries-2009, 2014 09/19/2014  . Sleep apnea-C pap intol 09/19/2014  . HTN (hypertension) 08/08/2012  . HLD (hyperlipidemia) 08/08/2012  . Obesity BMI 36 08/08/2012  . Diabetes mellitus type 2 in obese 08/08/2012  . GERD (gastroesophageal reflux disease) 12/27/2010    Resolved Hospital Problems   Diagnosis Date Noted Date Resolved  No resolved problems to display.    Discharge Condition: Improved, being discharged home  Diet recommendation: Heart healthy carb modified  Filed Weights   09/19/14 1745  Weight: 97.977 kg (216 lb)    History of present illness:  Patient is 64 year old female with past oral history of hypertension and diabetes who was brought into the hospital on 3/18 after being sent over to the emergency room from her PCPs office as she was on the way and started developing severe nausea and shortness of breath: The car along with heavy pressure sensation on her chest radiating to her left arm. Pressure resolved with nitroglycerin. EKG in the emergency room noted atypical beats, but normal sinus rhythm. Initial troponins were normal. Cardiology consulted.  Hospital Course:  Principal Problem:   Chest pain precordial: EKG noted PVCs. Troponins 3 cycled and negative. Patient remained chest pain-free since arriving to the emergency room and with repeat labs normal, cardiology felt okay to discharge home with plans for outpatient follow-up at that time consideration for further stress test. Active  Problems:   GERD (gastroesophageal reflux disease): Stable on PPI   HTN (hypertension): Blood pressure stable continue home meds   Diabetes mellitus type 2 in obese: CBG stable   Obesity: Patient is criteria with BMI 36   HLD (hyperlipidemia): Stable   Normal coronary arteries-2009, 2014   Sleep apnea-not able to tolerate C pap   Procedures:  None  Consultations:  Cardiology  Discharge Exam: BP 110/73 mmHg  Pulse 69  Temp(Src) 97.9 F (36.6 C) (Oral)  Resp 17  Ht 5\' 5"  (1.651 m)  Wt 97.977 kg (216 lb)  BMI 35.94 kg/m2  SpO2 99%  General: Alert and oriented 3, no acute distress Cardiovascular: Regular rate and rhythm, S1-S2 Respiratory: Clear to auscultation bilaterally  Discharge Instructions You were cared for by a hospitalist during your hospital stay. If you have any questions about your discharge medications or the care you received while you were in the hospital after you are discharged, you can call the unit and asked to speak with the hospitalist on call if the hospitalist that took care of you is not available. Once you are discharged, your primary care physician will handle any further medical issues. Please note that NO REFILLS for any discharge medications will be authorized once you are discharged, as it is imperative that you return to your primary care physician (or establish a relationship with a primary care physician if you do not have one) for your aftercare needs so that they can reassess your need for medications and monitor your lab values.  Discharge Instructions    Diet - low sodium heart healthy    Complete by:  As directed  Increase activity slowly    Complete by:  As directed             Medication List    STOP taking these medications        albuterol 108 (90 BASE) MCG/ACT inhaler  Commonly known as:  PROVENTIL HFA;VENTOLIN HFA     azithromycin 250 MG tablet  Commonly known as:  ZITHROMAX Z-PAK      TAKE these medications          ALPRAZolam 1 MG tablet  Commonly known as:  XANAX  Take 1 mg by mouth daily as needed. For anxiety     aspirin 81 MG tablet  Take 81 mg by mouth daily.     BELVIQ 10 MG Tabs  Generic drug:  Lorcaserin HCl  Take 1 tablet by mouth 2 (two) times daily.     busPIRone 10 MG tablet  Commonly known as:  BUSPAR  Take 10 mg by mouth 2 (two) times daily as needed. For anxiety     diclofenac 75 MG EC tablet  Commonly known as:  VOLTAREN  Take 75 mg by mouth 2 (two) times daily.     furosemide 20 MG tablet  Commonly known as:  LASIX  Take 20 mg by mouth daily as needed. For swelling     HYDROcodone-acetaminophen 10-325 MG per tablet  Commonly known as:  NORCO  Take 1 tablet by mouth every 6 (six) hours as needed. For pain     lisinopril 10 MG tablet  Commonly known as:  PRINIVIL,ZESTRIL  Take 20 mg by mouth daily.     metFORMIN 500 MG tablet  Commonly known as:  GLUCOPHAGE  Take by mouth daily.     NITROSTAT 0.4 MG SL tablet  Generic drug:  nitroGLYCERIN  Place 1 tablet under the tongue every 5 (five) minutes as needed.     omeprazole 20 MG capsule  Commonly known as:  PRILOSEC  TAKE ONE CAPSULE BY MOUTH TWICE A DAY     pravastatin 40 MG tablet  Commonly known as:  PRAVACHOL  Take 40 mg by mouth daily.     tiZANidine 4 MG capsule  Commonly known as:  ZANAFLEX  Take 4-8 mg by mouth daily as needed. For muscle pain     Vitamin D 2000 UNITS tablet  Take 2,000 Units by mouth daily.       Allergies  Allergen Reactions  . Penicillins Anaphylaxis and Swelling  . Shellfish-Derived Products Anaphylaxis and Swelling  . Sulfa Antibiotics Anaphylaxis and Swelling  . Morphine And Related Nausea And Vomiting       Follow-up Information    Follow up with Lorretta Harp, MD.   Specialty:  Cardiology   Why:  3-4 wks.  We will arrange an contact you.   Contact information:   [address] [place] [address] [phone]       Follow up with  DURAN,MICHAEL R, PA-C.   Specialty:  Cardiology   Why:  as scheduled.   Contact information:   [facility]  [address] [place] [address] [phone]        The results of significant diagnostics from this hospitalization (including imaging, microbiology, ancillary and laboratory) are listed below for reference.    Significant Diagnostic Studies: Dg Chest 2 View  09/19/2014   CLINICAL DATA:  Shortness breast and chest pain.  EXAM: CHEST - 2 VIEW  COMPARISON:  Two-view chest 06/13/2014.  FINDINGS: The heart size  is normal. Atherosclerotic calcifications are present at the arch. The lungs are clear. The visualized soft tissues and bony thorax are unremarkable. Surgical clips are present at the gallbladder fossa.  IMPRESSION: 1. No acute cardiopulmonary disease. 2. Atherosclerosis.   Electronically Signed   By: San Morelle M.D.   On: 09/19/2014 14:05   Ct Head Wo Contrast  08/22/2014   CLINICAL DATA:  Confusion and dysarthria  EXAM: CT HEAD WITHOUT CONTRAST  TECHNIQUE: Contiguous axial images were obtained from the base of the skull through the vertex without intravenous contrast.  COMPARISON:  August 11, 2010  FINDINGS: The ventricles are normal in size and configuration. There is no appreciable mass, hemorrhage, extra-axial fluid collection, or midline shift. Gray-white compartments appear normal. No acute infarct apparent. Bony calvarium appears intact. Mastoid air cells are clear. The patient has had ethmoidectomies bilaterally. There is mucosal thickening in the visualized maxillary regions.  IMPRESSION: No intracranial mass, hemorrhage, or acute appearing infarct. Evidence of previous ethmoid sinus surgery.   Electronically Signed   By: Lowella Grip III M.D.   On: 08/22/2014 11:26    Microbiology: No results found for this or any previous visit (from the past 240 hour(s)).   Labs: Basic Metabolic Panel:  Recent Labs Lab 09/19/14 1415 09/19/14 1847   NA 139  --   K 4.3  --   CL 105  --   CO2 27  --   GLUCOSE 113*  --   BUN 15  --   CREATININE 0.80 0.80  CALCIUM 9.6  --    Liver Function Tests: No results for input(s): AST, ALT, ALKPHOS, BILITOT, PROT, ALBUMIN in the last 168 hours. No results for input(s): LIPASE, AMYLASE in the last 168 hours. No results for input(s): AMMONIA in the last 168 hours. CBC:  Recent Labs Lab 09/19/14 1415 09/19/14 1847  WBC 6.3 6.2  NEUTROABS 3.8  --   HGB 13.7 13.4  HCT 41.4 39.5  MCV 84.8 84.4  PLT 180 177   Cardiac Enzymes:  Recent Labs Lab 09/19/14 1415 09/19/14 1847 09/19/14 2239 09/20/14 0400  TROPONINI <0.03 <0.03 <0.03 <0.03   BNP: BNP (last 3 results) No results for input(s): BNP in the last 8760 hours.  ProBNP (last 3 results) No results for input(s): PROBNP in the last 8760 hours.  CBG:  Recent Labs Lab 09/19/14 1755 09/19/14 2200 09/20/14 0658  GLUCAP 117* 133* 106*       Signed:  KRISHNAN,SENDIL K  [facility] 09/20/2014, 3:48 PM

## 2014-09-20 NOTE — Progress Notes (Signed)
SUBJECTIVE: Troponins are normal. Patient feels much better.   Filed Vitals:   09/19/14 1723 09/19/14 1745 09/19/14 2200 09/20/14 0443  BP: 127/66 154/98 103/49 110/73  Pulse: 75 77 84 69  Temp:  97.7 F (36.5 C) 98.2 F (36.8 C) 97.9 F (36.6 C)  TempSrc:  Oral Oral Oral  Resp: 20 18 18 17   Height:  5\' 5"  (1.651 m)    Weight:  216 lb (97.977 kg)    SpO2: 99% 99% 97% 99%     Intake/Output Summary (Last 24 hours) at 09/20/14 0904 Last data filed at 09/20/14 0900  Gross per 24 hour  Intake      0 ml  Output   1550 ml  Net  -1550 ml    LABS: Basic Metabolic Panel:  Recent Labs  09/19/14 1415 09/19/14 1847  NA 139  --   K 4.3  --   CL 105  --   CO2 27  --   GLUCOSE 113*  --   BUN 15  --   CREATININE 0.80 0.80  CALCIUM 9.6  --    Liver Function Tests: No results for input(s): AST, ALT, ALKPHOS, BILITOT, PROT, ALBUMIN in the last 72 hours. No results for input(s): LIPASE, AMYLASE in the last 72 hours. CBC:  Recent Labs  09/19/14 1415 09/19/14 1847  WBC 6.3 6.2  NEUTROABS 3.8  --   HGB 13.7 13.4  HCT 41.4 39.5  MCV 84.8 84.4  PLT 180 177   Cardiac Enzymes:  Recent Labs  09/19/14 1847 09/19/14 2239 09/20/14 0400  TROPONINI <0.03 <0.03 <0.03   BNP: Invalid input(s): POCBNP D-Dimer: No results for input(s): DDIMER in the last 72 hours. Hemoglobin A1C: No results for input(s): HGBA1C in the last 72 hours. Fasting Lipid Panel: No results for input(s): CHOL, HDL, LDLCALC, TRIG, CHOLHDL, LDLDIRECT in the last 72 hours. Thyroid Function Tests: No results for input(s): TSH, T4TOTAL, T3FREE, THYROIDAB in the last 72 hours.  Invalid input(s): FREET3  RADIOLOGY: Dg Chest 2 View  09/19/2014   CLINICAL DATA:  Shortness breast and chest pain.  EXAM: CHEST - 2 VIEW  COMPARISON:  Two-view chest 06/13/2014.  FINDINGS: The heart size is normal. Atherosclerotic calcifications are present at the arch. The lungs are clear. The visualized soft tissues and  bony thorax are unremarkable. Surgical clips are present at the gallbladder fossa.  IMPRESSION: 1. No acute cardiopulmonary disease. 2. Atherosclerosis.   Electronically Signed   By: San Morelle M.D.   On: 09/19/2014 14:05   Ct Head Wo Contrast  08/22/2014   CLINICAL DATA:  Confusion and dysarthria  EXAM: CT HEAD WITHOUT CONTRAST  TECHNIQUE: Contiguous axial images were obtained from the base of the skull through the vertex without intravenous contrast.  COMPARISON:  August 11, 2010  FINDINGS: The ventricles are normal in size and configuration. There is no appreciable mass, hemorrhage, extra-axial fluid collection, or midline shift. Gray-white compartments appear normal. No acute infarct apparent. Bony calvarium appears intact. Mastoid air cells are clear. The patient has had ethmoidectomies bilaterally. There is mucosal thickening in the visualized maxillary regions.  IMPRESSION: No intracranial mass, hemorrhage, or acute appearing infarct. Evidence of previous ethmoid sinus surgery.   Electronically Signed   By: Lowella Grip III M.D.   On: 08/22/2014 11:26    PHYSICAL EXAM  patient is comfortable flat in bed. Lungs are clear. Respiratory effort is nonlabored. Cardiac exam reveals S1 and S2. Abdomen is soft. There is no peripheral  edema.   TELEMETRY: I have reviewed telemetry today September 20, 2014. There is normal sinus rhythm. There are scattered PVCs.   ASSESSMENT AND PLAN:     Chest pain     At this point there is no evidence of acute coronary ischemia. He'll be okay to discharge the patient home today. We will make arrangements for the patient to see Dr. Quay Burow for posthospital follow-up. He can decide over time about any further outpatient testing.     Active Problems:   GERD (gastroesophageal reflux disease)   HTN (hypertension)   Diabetes mellitus type 2 in obese   Obesity BMI 36   HLD (hyperlipidemia)   Normal coronary arteries-2009, 2014   Sleep apnea-C pap  intol   Dola Argyle 09/20/2014 9:04 AM

## 2014-09-20 NOTE — Progress Notes (Signed)
UR completed 

## 2014-09-22 LAB — HEMOGLOBIN A1C
Hgb A1c MFr Bld: 5.8 % — ABNORMAL HIGH (ref 4.8–5.6)
Mean Plasma Glucose: 120 mg/dL

## 2014-10-14 ENCOUNTER — Ambulatory Visit (INDEPENDENT_AMBULATORY_CARE_PROVIDER_SITE_OTHER): Payer: Medicare HMO | Admitting: Cardiology

## 2014-10-14 ENCOUNTER — Encounter: Payer: Self-pay | Admitting: Cardiology

## 2014-10-14 ENCOUNTER — Other Ambulatory Visit: Payer: Self-pay | Admitting: *Deleted

## 2014-10-14 VITALS — BP 130/80 | HR 60 | Ht 65.0 in | Wt 220.0 lb

## 2014-10-14 DIAGNOSIS — R0600 Dyspnea, unspecified: Secondary | ICD-10-CM | POA: Diagnosis not present

## 2014-10-14 MED ORDER — FUROSEMIDE 20 MG PO TABS
20.0000 mg | ORAL_TABLET | Freq: Every day | ORAL | Status: DC | PRN
Start: 2014-10-14 — End: 2015-06-26

## 2014-10-14 NOTE — Patient Instructions (Signed)
Your physician recommends that you schedule a follow-up appointment in: 6 month with Dr. Gwenlyn Found  We are ordering an echo for you to get done  When you get your labs done at [facility] be sure we get a copy of the results  Also let the doctor know that we would like to have a BMP done with your labs

## 2014-10-14 NOTE — Progress Notes (Signed)
10/14/2014 [name]   [birth year]  [id number]  Primary Physician: Secundino Ginger, PA-C Primary Cardiologist: Dr. Gwenlyn Found  Reason for Visit/CC: [facility] F/U for Chest Pain   HPI:  This is a 64 y.o. female with a past medical history significant for HTN, DM, dyslipidemia, obesity, IBS, and two prior caths revealing insignificant CAD in 2009 and 2014. The pt was recently seen at her PCP's office for routine f/u when she developed SSCP "like an elephant on my chest". An EKG was done showing PVCs and she was sent to [name]. Her pain resolved with SL NTG x 1. She had no further recurrence. Cardiac enzymes were cycled and negative x 3. She was felt stable from a cardiac standpoint and no further w/u was indicated. She was discharged home and now presents for post-hospital f/u.  She reports that she has done fairly well since discharge. She denies any further CP and no palpitations, however she does note occasional DOE, weight gain, increased abdominal girth, and LEE L>R. She also notes mild orthopnea, improved by elevating head of bed. She denies resting dyspnea. She states that she ran out of her lasix 2 years ago and failed to get it refilled. She was previously on 20 mg daily. Recent BMP during hospitalization showed normal renal function and normal K.  BP today is stable in the 485I systolic. HR 60 bpm.    Current Outpatient Prescriptions  Medication Sig Dispense Refill  . ALPRAZolam (XANAX) 1 MG tablet Take 1 mg by mouth daily as needed. For anxiety    . aspirin 81 MG tablet Take 81 mg by mouth daily.     . busPIRone (BUSPAR) 10 MG tablet Take 10 mg by mouth 2 (two) times daily as needed. For anxiety    . diclofenac (VOLTAREN) 75 MG EC tablet Take 75 mg by mouth 2 (two) times daily.    [name] HYDROcodone-acetaminophen (NORCO) 10-325 MG per tablet Take 1 tablet by mouth every 6 (six) hours as needed. For pain    . lisinopril (PRINIVIL,ZESTRIL) 10 MG tablet Take 20 mg by mouth daily.     .  Lorcaserin HCl (BELVIQ) 10 MG TABS Take 1 tablet by mouth 2 (two) times daily.    . metFORMIN (GLUCOPHAGE) 500 MG tablet Take by mouth daily.    [name] NITROSTAT 0.4 MG SL tablet Place 1 tablet under the tongue every 5 (five) minutes as needed.  0  . omeprazole (PRILOSEC) 20 MG capsule TAKE ONE CAPSULE BY MOUTH TWICE A DAY 60 capsule 3  . pravastatin (PRAVACHOL) 40 MG tablet Take 40 mg by mouth daily.    [name] tiZANidine (ZANAFLEX) 4 MG capsule Take 4-8 mg by mouth daily as needed. For muscle pain    . Cholecalciferol (VITAMIN D) 2000 UNITS tablet Take 2,000 Units by mouth daily.     . furosemide (LASIX) 20 MG tablet Take 20 mg by mouth daily as needed. For swelling     No current facility-administered medications for this visit.    Allergies  Allergen Reactions  . Penicillins Anaphylaxis and Swelling  . Shellfish-Derived Products Anaphylaxis and Swelling  . Sulfa Antibiotics Anaphylaxis and Swelling  . Morphine And Related Nausea And Vomiting    History   Social History  . Marital Status: Married    Spouse Name: N/A  . Number of Children: 3  . Years of Education: N/A   Occupational History  . Disabled    Social History Main Topics  . Smoking status:  Never Smoker   . Smokeless tobacco: Never Used  . Alcohol Use: No  . Drug Use: No  . Sexual Activity: Yes    Birth Control/ Protection: Surgical   Other Topics Concern  . Not on file   Social History Narrative     Review of Systems: General: negative for chills, fever, night sweats or weight changes.  Cardiovascular: negative for chest pain, dyspnea on exertion, edema, orthopnea, palpitations, paroxysmal nocturnal dyspnea or shortness of breath Dermatological: negative for rash Respiratory: negative for cough or wheezing Urologic: negative for hematuria Abdominal: negative for nausea, vomiting, diarrhea, bright red blood per rectum, melena, or hematemesis Neurologic: negative for visual changes, syncope, or dizziness All other  systems reviewed and are otherwise negative except as noted above.    Blood pressure 130/80, pulse 60, height 5\' 5"  (1.651 m), weight 220 lb (99.791 kg).  General appearance: alert, cooperative and no distress Neck: no carotid bruit and no JVD Lungs: clear to auscultation bilaterally Heart: regular rate and rhythm, S1, S2 normal, no murmur, click, rub or gallop Extremities: trace LEE Pulses: 2+ and symmetric Skin: warm and dry Neurologic: Grossly normal  EKG Not performed  ASSESSMENT AND PLAN:   1. Chest Pain: no further recurrence. She ruled-out for MI during hospitalization. She had a LHC in 2014 revealing normal coronaries and normal LVF. Continue medical therapy and risk factor reduction. If recurrent CP, will consider NST.   2. Dyspnea on Exertion: also with orthopnea, weight gain and mild LEE. Will assess LVF and wall motion by 2D echo. Restart PO Lasix, 20 mg daily. Recheck BMP in 2 weeks to reassess renal function and electrolytes.    PLAN  If abnormal echo, will have patient return in 2 weeks for further w/u. If normal echo, will plan for patient to return in 6 months for f/u with Dr. Gwenlyn Found.  [name], [name] 10/14/2014 11:45 AM

## 2014-10-21 ENCOUNTER — Ambulatory Visit (HOSPITAL_COMMUNITY)
Admission: RE | Admit: 2014-10-21 | Discharge: 2014-10-21 | Disposition: A | Discharge status: Home / Self Care | Payer: Medicare HMO | Source: Ambulatory Visit | Attending: Cardiovascular Disease | Admitting: Cardiovascular Disease

## 2014-10-21 DIAGNOSIS — R0600 Dyspnea, unspecified: Secondary | ICD-10-CM | POA: Insufficient documentation

## 2014-10-21 NOTE — Progress Notes (Signed)
2D Echocardiogram Complete.  10/21/2014   Braddock Servellon, RDCS  

## 2015-01-27 ENCOUNTER — Encounter: Payer: Self-pay | Admitting: Cardiovascular Disease

## 2015-03-27 ENCOUNTER — Encounter (HOSPITAL_COMMUNITY): Payer: Self-pay | Admitting: Emergency Medicine

## 2015-03-27 ENCOUNTER — Emergency Department (HOSPITAL_COMMUNITY): Payer: No Typology Code available for payment source

## 2015-03-27 ENCOUNTER — Emergency Department (HOSPITAL_COMMUNITY)
Admission: EM | Admit: 2015-03-27 | Discharge: 2015-03-27 | Disposition: A | Discharge status: Home / Self Care | Payer: No Typology Code available for payment source | Attending: Emergency Medicine | Admitting: Emergency Medicine

## 2015-03-27 DIAGNOSIS — Y92410 Unspecified street and highway as the place of occurrence of the external cause: Secondary | ICD-10-CM | POA: Diagnosis not present

## 2015-03-27 DIAGNOSIS — E785 Hyperlipidemia, unspecified: Secondary | ICD-10-CM | POA: Insufficient documentation

## 2015-03-27 DIAGNOSIS — Z79899 Other long term (current) drug therapy: Secondary | ICD-10-CM | POA: Diagnosis not present

## 2015-03-27 DIAGNOSIS — S199XXA Unspecified injury of neck, initial encounter: Secondary | ICD-10-CM | POA: Insufficient documentation

## 2015-03-27 DIAGNOSIS — Z7982 Long term (current) use of aspirin: Secondary | ICD-10-CM | POA: Diagnosis not present

## 2015-03-27 DIAGNOSIS — Z87442 Personal history of urinary calculi: Secondary | ICD-10-CM | POA: Diagnosis not present

## 2015-03-27 DIAGNOSIS — Z87440 Personal history of urinary (tract) infections: Secondary | ICD-10-CM | POA: Insufficient documentation

## 2015-03-27 DIAGNOSIS — K219 Gastro-esophageal reflux disease without esophagitis: Secondary | ICD-10-CM | POA: Insufficient documentation

## 2015-03-27 DIAGNOSIS — G8929 Other chronic pain: Secondary | ICD-10-CM | POA: Diagnosis not present

## 2015-03-27 DIAGNOSIS — M542 Cervicalgia: Secondary | ICD-10-CM

## 2015-03-27 DIAGNOSIS — S8991XA Unspecified injury of right lower leg, initial encounter: Secondary | ICD-10-CM | POA: Insufficient documentation

## 2015-03-27 DIAGNOSIS — Z86010 Personal history of colonic polyps: Secondary | ICD-10-CM | POA: Insufficient documentation

## 2015-03-27 DIAGNOSIS — J45901 Unspecified asthma with (acute) exacerbation: Secondary | ICD-10-CM | POA: Diagnosis not present

## 2015-03-27 DIAGNOSIS — I252 Old myocardial infarction: Secondary | ICD-10-CM | POA: Diagnosis not present

## 2015-03-27 DIAGNOSIS — M545 Low back pain: Secondary | ICD-10-CM

## 2015-03-27 DIAGNOSIS — Y9389 Activity, other specified: Secondary | ICD-10-CM | POA: Diagnosis not present

## 2015-03-27 DIAGNOSIS — E119 Type 2 diabetes mellitus without complications: Secondary | ICD-10-CM | POA: Diagnosis not present

## 2015-03-27 DIAGNOSIS — Y998 Other external cause status: Secondary | ICD-10-CM | POA: Insufficient documentation

## 2015-03-27 DIAGNOSIS — I10 Essential (primary) hypertension: Secondary | ICD-10-CM | POA: Insufficient documentation

## 2015-03-27 DIAGNOSIS — S299XXA Unspecified injury of thorax, initial encounter: Secondary | ICD-10-CM | POA: Insufficient documentation

## 2015-03-27 DIAGNOSIS — Z8669 Personal history of other diseases of the nervous system and sense organs: Secondary | ICD-10-CM | POA: Insufficient documentation

## 2015-03-27 DIAGNOSIS — Z880 Allergy status to penicillin: Secondary | ICD-10-CM | POA: Diagnosis not present

## 2015-03-27 DIAGNOSIS — S3992XA Unspecified injury of lower back, initial encounter: Secondary | ICD-10-CM | POA: Diagnosis present

## 2015-03-27 DIAGNOSIS — Z9889 Other specified postprocedural states: Secondary | ICD-10-CM | POA: Diagnosis not present

## 2015-03-27 DIAGNOSIS — R079 Chest pain, unspecified: Secondary | ICD-10-CM

## 2015-03-27 DIAGNOSIS — M1990 Unspecified osteoarthritis, unspecified site: Secondary | ICD-10-CM | POA: Diagnosis not present

## 2015-03-27 MED ORDER — DIAZEPAM 5 MG PO TABS
5.0000 mg | ORAL_TABLET | Freq: Once | ORAL | Status: DC
  Filled 2015-03-27: qty 1

## 2015-03-27 MED ORDER — IBUPROFEN 600 MG PO TABS: 600.0000 mg | ORAL_TABLET | Freq: Three times a day (TID) | ORAL | Status: DC | PRN

## 2015-03-27 MED ORDER — KETOROLAC TROMETHAMINE 60 MG/2ML IM SOLN
60.0000 mg | Freq: Once | INTRAMUSCULAR | Status: AC
  Administered 2015-03-27: 60 mg via INTRAMUSCULAR
  Filled 2015-03-27: qty 2

## 2015-03-27 MED ORDER — OXYCODONE-ACETAMINOPHEN 5-325 MG PO TABS
2.0000 | ORAL_TABLET | Freq: Once | ORAL | Status: DC
  Filled 2015-03-27: qty 2

## 2015-03-27 MED ORDER — HYDROCODONE-ACETAMINOPHEN 5-325 MG PO TABS
2.0000 | ORAL_TABLET | Freq: Once | ORAL | Status: AC
  Administered 2015-03-27: 2 via ORAL
  Filled 2015-03-27: qty 2

## 2015-03-27 MED ORDER — CYCLOBENZAPRINE HCL 10 MG PO TABS: 10.0000 mg | ORAL_TABLET | Freq: Three times a day (TID) | ORAL | Status: DC | PRN
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
## 2015-03-27 NOTE — ED Notes (Signed)
Bed: WA19 Expected date:  Expected time:  Means of arrival:  Comments: TR 6  

## 2015-03-27 NOTE — Discharge Instructions (Signed)
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
## 2015-04-21 ENCOUNTER — Encounter: Payer: Self-pay | Admitting: Cardiovascular Disease

## 2015-04-21 ENCOUNTER — Ambulatory Visit (INDEPENDENT_AMBULATORY_CARE_PROVIDER_SITE_OTHER): Payer: Medicare HMO | Admitting: Cardiovascular Disease

## 2015-04-21 VITALS — BP 144/78 | HR 82 | Ht 65.0 in | Wt 227.3 lb

## 2015-04-21 DIAGNOSIS — R079 Chest pain, unspecified: Secondary | ICD-10-CM

## 2015-04-21 DIAGNOSIS — I10 Essential (primary) hypertension: Secondary | ICD-10-CM | POA: Diagnosis not present

## 2015-04-21 DIAGNOSIS — E785 Hyperlipidemia, unspecified: Secondary | ICD-10-CM

## 2015-04-21 DIAGNOSIS — R0600 Dyspnea, unspecified: Secondary | ICD-10-CM | POA: Diagnosis not present

## 2015-04-21 NOTE — Assessment & Plan Note (Signed)
History of hypertension blood pressure measured at 144/78. She is on amlodipine and Olmasartan  as well as lisinopril. Continue current meds at current dosing

## 2015-04-21 NOTE — Assessment & Plan Note (Signed)
History of hyperlipidemia followed by her PCP 

## 2015-04-21 NOTE — Patient Instructions (Signed)
Medication Instructions:  Your physician recommends that you continue on your current medications as directed. Please refer to the Current Medication list given to you today.  Labwork: none  Testing/Procedures: none  Follow-Up: Your physician wants you to follow-up in: 12 months with Dr. Berry. You will receive a reminder letter in the mail two months in advance. If you don't receive a letter, please call our office to schedule the follow-up appointment.    Any Other Special Instructions Will Be Listed Below (If Applicable).   

## 2015-04-21 NOTE — Assessment & Plan Note (Signed)
History of atypical chest pain with 2 normal cardiac catheterizations in 2009 and 14

## 2015-04-21 NOTE — Progress Notes (Signed)
04/21/2015 [name]   Jul 05, 1950  [id number]  Primary Physician Isaias Cowman, PA-C Primary Cardiologist: Lorretta Harp MD Renae Gloss   HPI:  [name] is a 64 year old severely overweight married Caucasian female mother of 3 children (1 deceased) he was seen 6 months ago by [facility] in our clinic. Her primary care provider is Roque Cash. She has a history of hypertension, hyperlipidemia, and diabetes. She has never had a heart attack or stroke. She does have sleep apnea. She has had 2 normal cortical catheterization some 2009 and 14 respectively. She does get occasional atypical chest pain.   Current Outpatient Prescriptions  Medication Sig Dispense Refill  . ALPRAZolam (XANAX) 1 MG tablet Take 0.5-1 mg by mouth daily as needed for anxiety.     [name] amLODipine-olmesartan (AZOR) 10-40 MG per tablet Take 1 tablet by mouth daily.    [name] aspirin 81 MG tablet Take 81 mg by mouth daily.     . busPIRone (BUSPAR) 10 MG tablet Take 10 mg by mouth 2 (two) times daily as needed (anxiety).     . cholecalciferol (VITAMIN D) 1000 UNITS tablet Take 1,000 Units by mouth 2 (two) times daily.    . cyclobenzaprine (FLEXERIL) 10 MG tablet Take 1 tablet (10 mg total) by mouth 3 (three) times daily as needed for muscle spasms. 12 tablet 0  . diclofenac (VOLTAREN) 75 MG EC tablet Take 75 mg by mouth 2 (two) times daily.    . furosemide (LASIX) 20 MG tablet Take 1 tablet (20 mg total) by mouth daily as needed. For swelling (Patient taking differently: Take 20 mg by mouth daily as needed for fluid or edema. ) 90 tablet 3  . HYDROcodone-acetaminophen (NORCO) 10-325 MG per tablet Take 0.5-1 tablets by mouth every 6 (six) hours as needed for moderate pain or severe pain.     [name] ibuprofen (ADVIL,MOTRIN) 600 MG tablet Take 1 tablet (600 mg total) by mouth every 8 (eight) hours as needed. 15 tablet 0  . lisinopril (PRINIVIL,ZESTRIL) 10 MG tablet Take 20 mg by mouth daily.     . metFORMIN  (GLUCOPHAGE) 500 MG tablet Take 500 mg by mouth daily.     [name] NITROSTAT 0.4 MG SL tablet Place 1 tablet under the tongue every 5 (five) minutes as needed.  0  . omeprazole (PRILOSEC) 20 MG capsule TAKE ONE CAPSULE BY MOUTH TWICE A DAY (Patient taking differently: TAKE 20 MG BY MOUTH TWICE DAILY) 60 capsule 3  . tiZANidine (ZANAFLEX) 4 MG capsule Take 4-8 mg by mouth daily as needed for muscle spasms. For muscle pain     No current facility-administered medications for this visit.    Allergies  Allergen Reactions  . Penicillins Anaphylaxis and Swelling    Has patient had a PCN reaction causing immediate rash, facial/tongue/throat swelling, SOB or lightheadedness with hypotension: Yes Has patient had a PCN reaction causing severe rash involving mucus membranes or skin necrosis: No Has patient had a PCN reaction that required hospitalization: Yes Has patient had a PCN reaction occurring within the last 10 years: No    . Shellfish-Derived Products Anaphylaxis and Swelling  . Sulfa Antibiotics Anaphylaxis and Swelling  . Morphine And Related Nausea And Vomiting    Social History   Social History  . Marital Status: Married    Spouse Name: N/A  . Number of Children: 3  . Years of Education: N/A   Occupational History  . Disabled    Social  History Main Topics  . Smoking status: Never Smoker   . Smokeless tobacco: Never Used  . Alcohol Use: No  . Drug Use: No  . Sexual Activity: Yes    Birth Control/ Protection: Surgical   Other Topics Concern  . Not on file   Social History Narrative     Review of Systems: General: negative for chills, fever, night sweats or weight changes.  Cardiovascular: negative for chest pain, dyspnea on exertion, edema, orthopnea, palpitations, paroxysmal nocturnal dyspnea or shortness of breath Dermatological: negative for rash Respiratory: negative for cough or wheezing Urologic: negative for hematuria Abdominal: negative for nausea, vomiting,  diarrhea, bright red blood per rectum, melena, or hematemesis Neurologic: negative for visual changes, syncope, or dizziness All other systems reviewed and are otherwise negative except as noted above.    Blood pressure 144/78, pulse 82, height 5\' 5"  (1.651 m), weight 227 lb 4.8 oz (103.103 kg).  General appearance: alert and no distress Neck: no adenopathy, no carotid bruit, no JVD, supple, symmetrical, trachea midline and thyroid not enlarged, symmetric, no tenderness/mass/nodules Lungs: clear to auscultation bilaterally Heart: regular rate and rhythm, S1, S2 normal, no murmur, click, rub or gallop Extremities: extremities normal, atraumatic, no cyanosis or edema  EKG normal sinus rhythm 82 with occasional PVCs. I personally reviewed this EKG  ASSESSMENT AND PLAN:   HTN (hypertension) History of hypertension blood pressure measured at 144/78. She is on amlodipine and Olmasartan  as well as lisinopril. Continue current meds at current dosing  HLD (hyperlipidemia) History of hyperlipidemia followed by her PCP  Chest pain History of atypical chest pain with 2 normal cardiac catheterizations in 2009 and [place] MD [facility], [facility] 04/21/2015 11:19 AM

## 2015-04-22 ENCOUNTER — Encounter: Payer: Self-pay | Admitting: Cardiovascular Disease

## 2015-06-26 ENCOUNTER — Other Ambulatory Visit: Payer: Self-pay | Admitting: Cardiovascular Disease

## 2015-08-12 ENCOUNTER — Other Ambulatory Visit: Payer: Self-pay

## 2015-08-12 MED ORDER — FUROSEMIDE 20 MG PO TABS: 20.0000 mg | ORAL_TABLET | Freq: Every day | ORAL | Status: DC

## 2015-10-02 ENCOUNTER — Other Ambulatory Visit: Payer: Self-pay | Admitting: *Deleted

## 2015-10-02 MED ORDER — FUROSEMIDE 20 MG PO TABS: 20.0000 mg | ORAL_TABLET | Freq: Every day | ORAL | Status: DC

## 2016-03-08 ENCOUNTER — Ambulatory Visit (INDEPENDENT_AMBULATORY_CARE_PROVIDER_SITE_OTHER): Payer: Medicare HMO | Admitting: Cardiovascular Disease

## 2016-03-08 ENCOUNTER — Encounter: Payer: Self-pay | Admitting: Cardiovascular Disease

## 2016-03-08 VITALS — BP 154/68 | HR 66 | Ht 65.0 in | Wt 216.0 lb

## 2016-03-08 DIAGNOSIS — E785 Hyperlipidemia, unspecified: Secondary | ICD-10-CM

## 2016-03-08 DIAGNOSIS — R0789 Other chest pain: Secondary | ICD-10-CM

## 2016-03-08 DIAGNOSIS — I10 Essential (primary) hypertension: Secondary | ICD-10-CM

## 2016-03-08 NOTE — Patient Instructions (Signed)

## 2016-03-08 NOTE — Assessment & Plan Note (Addendum)
History of hypertension blood pressure measured at 154/68. She is on lisinopril. Continue current meds at current dosing.

## 2016-03-08 NOTE — Assessment & Plan Note (Signed)
History of hyperlipidemia not on statin therapy followed by her PCP 

## 2016-03-08 NOTE — Assessment & Plan Note (Signed)
History of atypical chest pain was likely related to anxiety with 2 normal crit 30 positions performed in 2009 and 2014. I do not think further workup at this time is warranted.

## 2016-03-08 NOTE — Progress Notes (Signed)
03/08/2016 [name]   March 17, 1951  [id number]  Primary Physician Chelsea Cowman, PA-C Primary Cardiologist: Chelsea Harp MD [name]  HPI:   [name] is a 65 year old severely overweight married Caucasian female mother of 3 children (1 deceased) 2023/01/29 last saw in the office 04/21/15. Unfortunately, her husband of 40 years passed away in 2015/09/29 she is still grieving. She is also taking care of her elderly mother. Her primary care provider is [name]. She has a history of hypertension, hyperlipidemia, and diabetes. She has never had a heart attack or stroke. She does have sleep apnea. She has had 2 normal cardiac catheterizations in 2009 and 14 respectively. She does get occasional atypical chest pain.   Current Outpatient Prescriptions  Medication Sig Dispense Refill  . ALPRAZolam (XANAX) 1 MG tablet Take 0.5-1 mg by mouth daily as needed for anxiety.     [name] aspirin 81 MG tablet Take 81 mg by mouth daily.     . busPIRone (BUSPAR) 10 MG tablet Take 10 mg by mouth 2 (two) times daily as needed (anxiety).     . cholecalciferol (VITAMIN D) 1000 UNITS tablet Take 1,000 Units by mouth 2 (two) times daily.    . diclofenac (VOLTAREN) 75 MG EC tablet Take 75 mg by mouth 2 (two) times daily.    . furosemide (LASIX) 20 MG tablet Take 1 tablet (20 mg total) by mouth daily. 90 tablet 2  . HYDROcodone-acetaminophen (NORCO) 10-325 MG per tablet Take 0.5-1 tablets by mouth every 6 (six) hours as needed for moderate pain or severe pain.     [name] ibuprofen (ADVIL,MOTRIN) 600 MG tablet Take 1 tablet (600 mg total) by mouth every 8 (eight) hours as needed. 15 tablet 0  . lisinopril (PRINIVIL,ZESTRIL) 10 MG tablet Take 20 mg by mouth daily.     . metFORMIN (GLUCOPHAGE) 500 MG tablet Take 500 mg by mouth daily.     [name] NITROSTAT 0.4 MG SL tablet Place 1 tablet under the tongue every 5 (five) minutes as needed.  0  . omeprazole (PRILOSEC) 20 MG capsule TAKE ONE CAPSULE BY MOUTH TWICE A  DAY (Patient taking differently: TAKE 20 MG BY MOUTH TWICE DAILY) 60 capsule 3  . tiZANidine (ZANAFLEX) 4 MG capsule Take 4-8 mg by mouth daily as needed for muscle spasms. For muscle pain     No current facility-administered medications for this visit.     Allergies  Allergen Reactions  . Penicillins Anaphylaxis and Swelling    Has patient had a PCN reaction causing immediate rash, facial/tongue/throat swelling, SOB or lightheadedness with hypotension: Yes Has patient had a PCN reaction causing severe rash involving mucus membranes or skin necrosis: No Has patient had a PCN reaction that required hospitalization: Yes Has patient had a PCN reaction occurring within the last 10 years: No    . Shellfish-Derived Products Anaphylaxis and Swelling  . Sulfa Antibiotics Anaphylaxis and Swelling  . Morphine And Related Nausea And Vomiting    Social History   Social History  . Marital status: Married    Spouse name: N/A  . Number of children: 3  . Years of education: N/A   Occupational History  . Disabled    Social History Main Topics  . Smoking status: Never Smoker  . Smokeless tobacco: Never Used  . Alcohol use No  . Drug use: No  . Sexual activity: Yes    Birth control/ protection: Surgical   Other Topics Concern  .  Not on file   Social History Narrative  . No narrative on file     Review of Systems: General: negative for chills, fever, night sweats or weight changes.  Cardiovascular: negative for chest pain, dyspnea on exertion, edema, orthopnea, palpitations, paroxysmal nocturnal dyspnea or shortness of breath Dermatological: negative for rash Respiratory: negative for cough or wheezing Urologic: negative for hematuria Abdominal: negative for nausea, vomiting, diarrhea, bright red blood per rectum, melena, or hematemesis Neurologic: negative for visual changes, syncope, or dizziness All other systems reviewed and are otherwise negative except as noted  above.    Blood pressure (!) 154/68, pulse 66, height 5\' 5"  (1.651 m), weight 216 lb (98 kg).  General appearance: alert and no distress Neck: no adenopathy, no carotid bruit, no JVD, supple, symmetrical, trachea midline and thyroid not enlarged, symmetric, no tenderness/mass/nodules Lungs: clear to auscultation bilaterally Heart: regular rate and rhythm, S1, S2 normal, no murmur, click, rub or gallop Extremities: extremities normal, atraumatic, no cyanosis or edema  EKG normal sinus rhythm at 66 with nonspecific ST and T-wave changes and occasional PVC. I personally reviewed this EKG  ASSESSMENT AND PLAN:   Chest pain History of atypical chest pain was likely related to anxiety with 2 normal crit 30 positions performed in 2009 and 2014. I do not think further workup at this time is warranted.  HTN (hypertension) History of hypertension blood pressure measured at 154/68. She is on lisinopril. Continue current meds at current dosing.  HLD (hyperlipidemia) History of hyperlipidemia not on statin therapy followed by her PCP      Chelsea Harp MD [facility], [facility] 03/08/2016 11:47 AM

## 2017-03-17 ENCOUNTER — Encounter: Payer: Self-pay | Admitting: Cardiovascular Disease

## 2017-03-17 ENCOUNTER — Ambulatory Visit (INDEPENDENT_AMBULATORY_CARE_PROVIDER_SITE_OTHER): Payer: Medicare HMO | Admitting: Cardiovascular Disease

## 2017-03-17 DIAGNOSIS — I10 Essential (primary) hypertension: Secondary | ICD-10-CM | POA: Diagnosis not present

## 2017-03-17 DIAGNOSIS — IMO0001 Reserved for inherently not codable concepts without codable children: Secondary | ICD-10-CM

## 2017-03-17 DIAGNOSIS — E7800 Pure hypercholesterolemia, unspecified: Secondary | ICD-10-CM | POA: Diagnosis not present

## 2017-03-17 DIAGNOSIS — Z0389 Encounter for observation for other suspected diseases and conditions ruled out: Secondary | ICD-10-CM

## 2017-03-17 NOTE — Assessment & Plan Note (Signed)
History of chest pain with 2 prior normal cardiac catheterizations. I do not think these are ischemically mediated.

## 2017-03-17 NOTE — Progress Notes (Signed)
03/17/2017 [name]   [birth year]  [id number]  Primary Physician Secundino Ginger, PA-C Primary Cardiologist: Lorretta Harp MD [facility], [place], [place]  HPI:  [name] is a 66 y.o. female  severely overweight married Caucasian female mother of 3 children (1 deceased) 01-31-23 last saw in the office 03/08/16. Unfortunately, her husband of 40 years passed away in 10-01-15 she is still grieving. She is also taking care of her elderly mother. Her primary care provider is Roque Cash. She has a history of hypertension, hyperlipidemia, and diabetes. She has never had a heart attack or stroke. She does have sleep apnea. She has had 2 normal cardiac catheterizations in 10-01-07 and 14 respectively. She does get occasional atypical chest pain. Since I saw her year ago she's remained stable with occasional episodes of chest pain.   Current Meds  Medication Sig  . ALPRAZolam (XANAX) 1 MG tablet Take 0.5-1 mg by mouth daily as needed for anxiety.   [name] aspirin 81 MG tablet Take 81 mg by mouth daily.   . busPIRone (BUSPAR) 10 MG tablet Take 10 mg by mouth 2 (two) times daily as needed (anxiety).   . cholecalciferol (VITAMIN D) 1000 UNITS tablet Take 1,000 Units by mouth 2 (two) times daily.  . diclofenac (VOLTAREN) 75 MG EC tablet Take 75 mg by mouth 2 (two) times daily.  . furosemide (LASIX) 20 MG tablet Take 1 tablet (20 mg total) by mouth daily.  [name] HYDROcodone-acetaminophen (NORCO) 10-325 MG per tablet Take 0.5-1 tablets by mouth every 6 (six) hours as needed for moderate pain or severe pain.   [name] ibuprofen (ADVIL,MOTRIN) 600 MG tablet Take 1 tablet (600 mg total) by mouth every 8 (eight) hours as needed.  [name] lisinopril (PRINIVIL,ZESTRIL) 10 MG tablet Take 20 mg by mouth daily.   . metFORMIN (GLUCOPHAGE) 500 MG tablet Take 500 mg by mouth daily.   [name] NITROSTAT 0.4 MG SL tablet Place 1 tablet under the tongue every 5 (five) minutes as needed.  [name] omeprazole (PRILOSEC) 20 MG capsule TAKE ONE CAPSULE  BY MOUTH TWICE A DAY (Patient taking differently: TAKE 20 MG BY MOUTH TWICE DAILY)  . tiZANidine (ZANAFLEX) 4 MG capsule Take 4-8 mg by mouth daily as needed for muscle spasms. For muscle pain     Allergies  Allergen Reactions  . Penicillins Anaphylaxis and Swelling    Has patient had a PCN reaction causing immediate rash, facial/tongue/throat swelling, SOB or lightheadedness with hypotension: Yes Has patient had a PCN reaction causing severe rash involving mucus membranes or skin necrosis: No Has patient had a PCN reaction that required hospitalization: Yes Has patient had a PCN reaction occurring within the last 10 years: No    . Shellfish-Derived Products Anaphylaxis and Swelling  . Sulfa Antibiotics Anaphylaxis and Swelling  . Morphine And Related Nausea And Vomiting    Social History   Social History  . Marital status: Widowed    Spouse name: N/A  . Number of children: 3  . Years of education: N/A   Occupational History  . Disabled    Social History Main Topics  . Smoking status: Never Smoker  . Smokeless tobacco: Never Used  . Alcohol use No  . Drug use: No  . Sexual activity: Yes    Birth control/ protection: Surgical   Other Topics Concern  . Not on file   Social History Narrative  . No narrative on file     Review of  Systems: General: negative for chills, fever, night sweats or weight changes.  Cardiovascular: negative for chest pain, dyspnea on exertion, edema, orthopnea, palpitations, paroxysmal nocturnal dyspnea or shortness of breath Dermatological: negative for rash Respiratory: negative for cough or wheezing Urologic: negative for hematuria Abdominal: negative for nausea, vomiting, diarrhea, bright red blood per rectum, melena, or hematemesis Neurologic: negative for visual changes, syncope, or dizziness All other systems reviewed and are otherwise negative except as noted above.    Blood pressure 134/80, pulse 85, height 5\' 5"  (1.651 m), weight  214 lb (97.1 kg).  General appearance: alert and no distress Neck: no adenopathy, no carotid bruit, no JVD, supple, symmetrical, trachea midline and thyroid not enlarged, symmetric, no tenderness/mass/nodules Lungs: clear to auscultation bilaterally Heart: regular rate and rhythm, S1, S2 normal, no murmur, click, rub or gallop Extremities: extremities normal, atraumatic, no cyanosis or edema Pulses: 2+ and symmetric Skin: Skin color, texture, turgor normal. No rashes or lesions Neurologic: Alert and oriented X 3, normal strength and tone. Normal symmetric reflexes. Normal coordination and gait  EKG sinus rhythm at 85 with bigeminal PVCs. I personally reviewed this EKG.  ASSESSMENT AND PLAN:   HTN (hypertension) History of essential hypertension blood pressure measured today 134/80. She is on lisinopril. Continue current meds at current dosing  HLD (hyperlipidemia) History of hyperlipidemia on statin therapy followed by her PCP  Normal coronary arteries-2009, 2014 History of chest pain with 2 prior normal cardiac catheterizations. I do not think these are ischemically mediated.      Lorretta Harp MD FACP,FACC,FAHA, [facility] 03/17/2017 4:27 PM

## 2017-03-17 NOTE — Assessment & Plan Note (Signed)
History of essential hypertension blood pressure measured today 134/80. She is on lisinopril. Continue current meds at current dosing

## 2017-03-17 NOTE — Patient Instructions (Signed)

## 2017-03-17 NOTE — Addendum Note (Signed)
Addended by: Alvina Filbert B on: 03/17/2017 05:33 PM   Modules accepted: Orders

## 2017-03-17 NOTE — Assessment & Plan Note (Signed)
History of hyperlipidemia on statin therapy followed by her PCP. 

## 2018-03-20 ENCOUNTER — Encounter: Payer: Self-pay | Admitting: Cardiovascular Disease

## 2018-03-20 ENCOUNTER — Ambulatory Visit: Payer: Medicare HMO | Admitting: Cardiovascular Disease

## 2018-03-20 VITALS — BP 154/72 | HR 80 | Ht 65.0 in | Wt 204.0 lb

## 2018-03-20 DIAGNOSIS — E7800 Pure hypercholesterolemia, unspecified: Secondary | ICD-10-CM | POA: Diagnosis not present

## 2018-03-20 DIAGNOSIS — R0602 Shortness of breath: Secondary | ICD-10-CM | POA: Diagnosis not present

## 2018-03-20 DIAGNOSIS — I493 Ventricular premature depolarization: Secondary | ICD-10-CM | POA: Insufficient documentation

## 2018-03-20 DIAGNOSIS — I10 Essential (primary) hypertension: Secondary | ICD-10-CM

## 2018-03-20 MED ORDER — METOPROLOL SUCCINATE ER 25 MG PO TB24: 12.5000 mg | ORAL_TABLET | Freq: Every day | ORAL | 3 refills | Status: DC

## 2018-03-20 NOTE — Assessment & Plan Note (Signed)
Complains of palpitations with bigeminal PVCs on twelve-lead EKG today.  I am going to start her on a low-dose beta-blocker to help improve the frequency of these.

## 2018-03-20 NOTE — Assessment & Plan Note (Signed)
History of essential hypertension her blood pressure measured at 154/72.  She is on lisinopril.  Continue current meds at current dosing.

## 2018-03-20 NOTE — Patient Instructions (Signed)
Medication Instructions:  Your physician has recommended you make the following change in your medication: START Metoprolol 12.5mg  daily. An Rx has been sent to your pharmacy   Labwork: None ordered  Testing/Procedures: Your physician has requested that you have an echocardiogram. Echocardiography is a painless test that uses sound waves to create images of your heart. It provides your doctor with information about the size and shape of your heart and how well your heart's chambers and valves are working. This procedure takes approximately one hour. There are no restrictions for this procedure.   Follow-Up: Your physician recommends that you schedule a follow-up appointment in: 6 months with an APP  Your physician wants you to follow-up in: 12 months with Dr.Berry You will receive a reminder letter in the mail two months in advance. If you don't receive a letter, please call our office to schedule the follow-up appointment.    Any Other Special Instructions Will Be Listed Below (If Applicable).     If you need a refill on your cardiac medications before your next appointment, please call your pharmacy.

## 2018-03-20 NOTE — Assessment & Plan Note (Signed)
History of hyperlipidemia not on statin therapy followed by her PCP 

## 2018-03-20 NOTE — Assessment & Plan Note (Signed)
History of atypical chest pain with normal coronary arteries by cath 2009 and 2014.

## 2018-03-20 NOTE — Progress Notes (Signed)
03/20/2018 [name]   May 29, 1951  [id number]  Primary Physician Secundino Ginger, PA-C Primary Cardiologist: Lorretta Harp MD FACP, [place], [place], [place]  HPI:  [name] is a 67 y.o.  severely overweight married Caucasian female mother of 3 children (1 deceased) 02-17-2023 last saw in the office 03/08/16. Unfortunately, her husband of 40 years passed away in 10/18/15 she is still grieving. She was also taking care of her elderly mother has since passed away.  Her sister also moved in with her.  I last saw her in the office 03/17/2017.[name] Her primary care provider is Roque Cash. She has a history of hypertension, hyperlipidemia, and diabetes. She has never had a heart attack or stroke. She does have sleep apnea. She has had 2 normal cardiac catheterizations in October 18, 2007 and 14 respectively. She does get occasional atypical chest pain. Since I saw her year ago she's remained stable with occasional episodes of chest pain Since I saw her a year ago she is remained stable.  She has episodic atypical chest pain as well.  She is also recently complained of palpitations which I suspect her PVCs.  She is under a lot of stress and she has high anxiety.  Current Meds  Medication Sig  . ALPRAZolam (XANAX) 1 MG tablet Take 0.5-1 mg by mouth daily as needed for anxiety.   [name] aspirin 81 MG tablet Take 81 mg by mouth daily.   . busPIRone (BUSPAR) 10 MG tablet Take 10 mg by mouth 2 (two) times daily as needed (anxiety).   . cholecalciferol (VITAMIN D) 1000 UNITS tablet Take 1,000 Units by mouth 2 (two) times daily.  . diclofenac (VOLTAREN) 75 MG EC tablet Take 75 mg by mouth 2 (two) times daily.  . furosemide (LASIX) 20 MG tablet Take 1 tablet (20 mg total) by mouth daily.  [name] HYDROcodone-acetaminophen (NORCO) 10-325 MG per tablet Take 0.5-1 tablets by mouth every 6 (six) hours as needed for moderate pain or severe pain.   [name] ibuprofen (ADVIL,MOTRIN) 600 MG tablet Take 1 tablet (600 mg total) by mouth every 8  (eight) hours as needed.  [name] lisinopril (PRINIVIL,ZESTRIL) 10 MG tablet Take 20 mg by mouth daily.   . metFORMIN (GLUCOPHAGE) 500 MG tablet Take 500 mg by mouth daily.   [name] NITROSTAT 0.4 MG SL tablet Place 1 tablet under the tongue every 5 (five) minutes as needed.  [name] omeprazole (PRILOSEC) 20 MG capsule TAKE ONE CAPSULE BY MOUTH TWICE A DAY (Patient taking differently: TAKE 20 MG BY MOUTH TWICE DAILY)  . tiZANidine (ZANAFLEX) 4 MG capsule Take 4-8 mg by mouth daily as needed for muscle spasms. For muscle pain     Allergies  Allergen Reactions  . Penicillins Anaphylaxis and Swelling    Has patient had a PCN reaction causing immediate rash, facial/tongue/throat swelling, SOB or lightheadedness with hypotension: Yes Has patient had a PCN reaction causing severe rash involving mucus membranes or skin necrosis: No Has patient had a PCN reaction that required hospitalization: Yes Has patient had a PCN reaction occurring within the last 10 years: No    . Shellfish-Derived Products Anaphylaxis and Swelling  . Sulfa Antibiotics Anaphylaxis and Swelling  . Morphine And Related Nausea And Vomiting    Social History   Socioeconomic History  . Marital status: Widowed    Spouse name: Not on file  . Number of children: 3  . Years of education: Not on file  . Highest education level: Not  on file  Occupational History  . Occupation: Disabled  Social Needs  . Financial resource strain: Not on file  . Food insecurity:    Worry: Not on file    Inability: Not on file  . Transportation needs:    Medical: Not on file    Non-medical: Not on file  Tobacco Use  . Smoking status: Never Smoker  . Smokeless tobacco: Never Used  Substance and Sexual Activity  . Alcohol use: No  . Drug use: No  . Sexual activity: Yes    Birth control/protection: Surgical  Lifestyle  . Physical activity:    Days per week: Not on file    Minutes per session: Not on file  . Stress: Not on file  Relationships  .  Social connections:    Talks on phone: Not on file    Gets together: Not on file    Attends religious service: Not on file    Active member of club or organization: Not on file    Attends meetings of clubs or organizations: Not on file    Relationship status: Not on file  . Intimate partner violence:    Fear of current or ex partner: Not on file    Emotionally abused: Not on file    Physically abused: Not on file    Forced sexual activity: Not on file  Other Topics Concern  . Not on file  Social History Narrative  . Not on file     Review of Systems: General: negative for chills, fever, night sweats or weight changes.  Cardiovascular: negative for chest pain, dyspnea on exertion, edema, orthopnea, palpitations, paroxysmal nocturnal dyspnea or shortness of breath Dermatological: negative for rash Respiratory: negative for cough or wheezing Urologic: negative for hematuria Abdominal: negative for nausea, vomiting, diarrhea, bright red blood per rectum, melena, or hematemesis Neurologic: negative for visual changes, syncope, or dizziness All other systems reviewed and are otherwise negative except as noted above.    Blood pressure (!) 154/72, pulse 80, height 5\' 5"  (1.651 m), weight 204 lb (92.5 kg).  General appearance: alert and no distress Neck: no adenopathy, no carotid bruit, no JVD, supple, symmetrical, trachea midline and thyroid not enlarged, symmetric, no tenderness/mass/nodules Lungs: clear to auscultation bilaterally Heart: regular rate and rhythm, S1, S2 normal, no murmur, click, rub or gallop Abdomen: soft, non-tender; bowel sounds normal; no masses,  no organomegaly Pulses: 2+ and symmetric Skin: Skin color, texture, turgor normal. No rashes or lesions Neurologic: Alert and oriented X 3, normal strength and tone. Normal symmetric reflexes. Normal coordination and gait  EKG sinus rhythm at 80 with bigeminal PVCs and low limb voltage.  I personally reviewed this  EKG  ASSESSMENT AND PLAN:   HTN (hypertension) History of essential hypertension her blood pressure measured at 154/72.  She is on lisinopril.  Continue current meds at current dosing.  HLD (hyperlipidemia) History of hyperlipidemia not on statin therapy followed by her PCP.  Normal coronary arteries-2009, 2014 History of atypical chest pain with normal coronary arteries by cath 2009 and 2014.  PVC's (premature ventricular contractions) Complains of palpitations with bigeminal PVCs on twelve-lead EKG today.  I am going to start her on a low-dose beta-blocker to help improve the frequency of these.      Lorretta Harp MD FACP,FACC,FAHA, [name] 03/20/2018 11:25 AM

## 2018-04-23 ENCOUNTER — Ambulatory Visit (HOSPITAL_COMMUNITY): Payer: Medicare HMO | Attending: Ophthalmology

## 2018-04-23 ENCOUNTER — Other Ambulatory Visit: Payer: Self-pay

## 2018-04-23 DIAGNOSIS — R0602 Shortness of breath: Secondary | ICD-10-CM

## 2018-04-23 MED ORDER — PERFLUTREN LIPID MICROSPHERE
1.0000 mL | INTRAVENOUS | Status: AC | PRN
Start: 2018-04-23 — End: 2018-04-23
  Administered 2018-04-23: 3 mL via INTRAVENOUS

## 2018-04-25 ENCOUNTER — Encounter: Payer: Self-pay | Admitting: Cardiovascular Disease

## 2018-04-25 ENCOUNTER — Ambulatory Visit (INDEPENDENT_AMBULATORY_CARE_PROVIDER_SITE_OTHER): Payer: Medicare HMO | Admitting: Cardiovascular Disease

## 2018-04-25 DIAGNOSIS — I10 Essential (primary) hypertension: Secondary | ICD-10-CM

## 2018-04-25 DIAGNOSIS — I519 Heart disease, unspecified: Secondary | ICD-10-CM | POA: Diagnosis not present

## 2018-04-25 MED ORDER — PREDNISONE 50 MG PO TABS: ORAL_TABLET | ORAL | 0 refills | Status: DC

## 2018-04-25 NOTE — Assessment & Plan Note (Signed)
She of essential hypertension her blood pressure measured today 132/74.  She is on lisinopril and metoprolol.

## 2018-04-25 NOTE — Assessment & Plan Note (Signed)
Recent 2D echo performed 04/23/2018 revealed a significant decrease in LV function with an EF of 20 to 25%.  This is compared to fairly normal EF and her 2D echo performed 10/21/2014.  She does complain of dyspnea on exertion, fatigue and some atypical chest pain.  Based on this, I recommended that we proceed with outpatient right and left heart cath via the radial/brachial approach next week. The patient understands that risks included but are not limited to stroke (1 in 1000), death (1 in 21), kidney failure [usually temporary] (1 in 500), bleeding (1 in 200), allergic reaction [possibly serious] (1 in 200). The patient understands and agrees to proceed

## 2018-04-25 NOTE — H&P (View-Only) (Signed)
04/25/2018 [name]   Aug 02, 1950  [id number]  Primary Physician Secundino Ginger, PA-C Primary Cardiologist: Lorretta Harp MD FACP, [place], [place], [place]  HPI:  [name] is a 67 y.o.  severely overweight married Caucasian female mother of 3 children (1 deceased) 02-21-2023 last saw in the office 03/20/2018. Unfortunately, her husband of 40 years passed away in 10-22-15 she is still grieving. She was also taking care of her elderly mother has since passed away 06-15-23.  Her sister also moved in with her.  I last saw her in the office 03/17/2017.[name] Her primary care provider is Roque Cash. She has a history of hypertension, hyperlipidemia, and diabetes. She has never had a heart attack or stroke. She does have sleep apnea. She has had 2 normal cardiac catheterizations in October 22, 2007 and 14 respectively. She does get occasional atypical chest pain.Since I saw her year ago she's remained stable with occasional episodes of chest pain Since I saw her a year ago she is remained stable.  She has episodic atypical chest pain as well.  She is also recently complained of palpitations which I suspect her PVCs.  She is under a lot of stress and she has high anxiety. She had a 2D echo performed 04/23/2018 revealing a significant decline in her LVEF to 20 to 25% compared to her previous echo performed 10/21/2014 which was essentially normal.  She does complain of dyspnea on exertion and atypical chest pain.  Current Meds  Medication Sig  . ALPRAZolam (XANAX) 1 MG tablet Take 0.5-1 mg by mouth daily as needed for anxiety.   [name] aspirin 81 MG tablet Take 81 mg by mouth daily.   . busPIRone (BUSPAR) 10 MG tablet Take 10 mg by mouth 2 (two) times daily as needed (anxiety).   . cholecalciferol (VITAMIN D) 1000 UNITS tablet Take 1,000 Units by mouth 2 (two) times daily.  . diclofenac (VOLTAREN) 75 MG EC tablet Take 75 mg by mouth 2 (two) times daily.  . furosemide (LASIX) 20 MG tablet Take 1 tablet (20 mg total) by  mouth daily.  [name] HYDROcodone-acetaminophen (NORCO) 10-325 MG per tablet Take 0.5-1 tablets by mouth every 6 (six) hours as needed for moderate pain or severe pain.   [name] ibuprofen (ADVIL,MOTRIN) 600 MG tablet Take 1 tablet (600 mg total) by mouth every 8 (eight) hours as needed.  [name] lisinopril (PRINIVIL,ZESTRIL) 10 MG tablet Take 20 mg by mouth daily.   . metFORMIN (GLUCOPHAGE) 500 MG tablet Take 500 mg by mouth daily.   . metoprolol succinate (TOPROL XL) 25 MG 24 hr tablet Take 0.5 tablets (12.5 mg total) by mouth daily.  [name] NITROSTAT 0.4 MG SL tablet Place 1 tablet under the tongue every 5 (five) minutes as needed.  [name] omeprazole (PRILOSEC) 20 MG capsule TAKE ONE CAPSULE BY MOUTH TWICE A DAY (Patient taking differently: TAKE 20 MG BY MOUTH TWICE DAILY)  . tiZANidine (ZANAFLEX) 4 MG capsule Take 4-8 mg by mouth daily as needed for muscle spasms. For muscle pain     Allergies  Allergen Reactions  . Penicillins Anaphylaxis and Swelling    Has patient had a PCN reaction causing immediate rash, facial/tongue/throat swelling, SOB or lightheadedness with hypotension: Yes Has patient had a PCN reaction causing severe rash involving mucus membranes or skin necrosis: No Has patient had a PCN reaction that required hospitalization: Yes Has patient had a PCN reaction occurring within the last 10 years: No    . Shellfish-Derived  Products Anaphylaxis and Swelling  . Sulfa Antibiotics Anaphylaxis and Swelling  . Morphine And Related Nausea And Vomiting    Social History   Socioeconomic History  . Marital status: Widowed    Spouse name: Not on file  . Number of children: 3  . Years of education: Not on file  . Highest education level: Not on file  Occupational History  . Occupation: Disabled  Social Needs  . Financial resource strain: Not on file  . Food insecurity:    Worry: Not on file    Inability: Not on file  . Transportation needs:    Medical: Not on file    Non-medical: Not on file    Tobacco Use  . Smoking status: Never Smoker  . Smokeless tobacco: Never Used  Substance and Sexual Activity  . Alcohol use: No  . Drug use: No  . Sexual activity: Yes    Birth control/protection: Surgical  Lifestyle  . Physical activity:    Days per week: Not on file    Minutes per session: Not on file  . Stress: Not on file  Relationships  . Social connections:    Talks on phone: Not on file    Gets together: Not on file    Attends religious service: Not on file    Active member of club or organization: Not on file    Attends meetings of clubs or organizations: Not on file    Relationship status: Not on file  . Intimate partner violence:    Fear of current or ex partner: Not on file    Emotionally abused: Not on file    Physically abused: Not on file    Forced sexual activity: Not on file  Other Topics Concern  . Not on file  Social History Narrative  . Not on file     Review of Systems: General: negative for chills, fever, night sweats or weight changes.  Cardiovascular: negative for chest pain, dyspnea on exertion, edema, orthopnea, palpitations, paroxysmal nocturnal dyspnea or shortness of breath Dermatological: negative for rash Respiratory: negative for cough or wheezing Urologic: negative for hematuria Abdominal: negative for nausea, vomiting, diarrhea, bright red blood per rectum, melena, or hematemesis Neurologic: negative for visual changes, syncope, or dizziness All other systems reviewed and are otherwise negative except as noted above.    Blood pressure 132/74, pulse (!) 45, height 5\' 5"  (1.651 m), weight 208 lb (94.3 kg).  General appearance: alert and no distress Neck: no adenopathy, no carotid bruit, no JVD, supple, symmetrical, trachea midline and thyroid not enlarged, symmetric, no tenderness/mass/nodules Lungs: clear to auscultation bilaterally Heart: regular rate and rhythm, S1, S2 normal, no murmur, click, rub or gallop Extremities: extremities  normal, atraumatic, no cyanosis or edema Pulses: 2+ and symmetric Skin: Skin color, texture, turgor normal. No rashes or lesions Neurologic: Alert and oriented X 3, normal strength and tone. Normal symmetric reflexes. Normal coordination and gait  EKG not performed today  ASSESSMENT AND PLAN:   HTN (hypertension) She of essential hypertension her blood pressure measured today 132/74.  She is on lisinopril and metoprolol.  Left ventricular dysfunction Recent 2D echo performed 04/23/2018 revealed a significant decrease in LV function with an EF of 20 to 25%.  This is compared to fairly normal EF and her 2D echo performed 10/21/2014.  She does complain of dyspnea on exertion, fatigue and some atypical chest pain.  Based on this, I recommended that we proceed with outpatient right and left heart cath via  the radial/brachial approach next week. The patient understands that risks included but are not limited to stroke (1 in 1000), death (1 in 70), kidney failure [usually temporary] (1 in 500), bleeding (1 in 200), allergic reaction [possibly serious] (1 in 200). The patient understands and agrees to proceed      Lorretta Harp MD [facility], [facility] 04/25/2018 3:50 PM

## 2018-04-25 NOTE — Progress Notes (Signed)
04/25/2018 [name]   January 23, 1951  [id number]  Primary Physician Secundino Ginger, PA-C Primary Cardiologist: Lorretta Harp MD FACP, [place], [place], [place]  HPI:  [name] is a 67 y.o.  severely overweight married Caucasian female mother of 3 children (1 deceased) 02/15/2023 last saw in the office 03/20/2018. Unfortunately, her husband of 40 years passed away in 10-16-15 she is still grieving. She was also taking care of her elderly mother has since passed away Jun 09, 2023.  Her sister also moved in with her.  I last saw her in the office 03/17/2017.[name] Her primary care provider is Roque Cash. She has a history of hypertension, hyperlipidemia, and diabetes. She has never had a heart attack or stroke. She does have sleep apnea. She has had 2 normal cardiac catheterizations in Oct 16, 2007 and 14 respectively. She does get occasional atypical chest pain.Since I saw her year ago she's remained stable with occasional episodes of chest pain Since I saw her a year ago she is remained stable.  She has episodic atypical chest pain as well.  She is also recently complained of palpitations which I suspect her PVCs.  She is under a lot of stress and she has high anxiety. She had a 2D echo performed 04/23/2018 revealing a significant decline in her LVEF to 20 to 25% compared to her previous echo performed 10/21/2014 which was essentially normal.  She does complain of dyspnea on exertion and atypical chest pain.  Current Meds  Medication Sig  . ALPRAZolam (XANAX) 1 MG tablet Take 0.5-1 mg by mouth daily as needed for anxiety.   [name] aspirin 81 MG tablet Take 81 mg by mouth daily.   . busPIRone (BUSPAR) 10 MG tablet Take 10 mg by mouth 2 (two) times daily as needed (anxiety).   . cholecalciferol (VITAMIN D) 1000 UNITS tablet Take 1,000 Units by mouth 2 (two) times daily.  . diclofenac (VOLTAREN) 75 MG EC tablet Take 75 mg by mouth 2 (two) times daily.  . furosemide (LASIX) 20 MG tablet Take 1 tablet (20 mg total) by  mouth daily.  [name] HYDROcodone-acetaminophen (NORCO) 10-325 MG per tablet Take 0.5-1 tablets by mouth every 6 (six) hours as needed for moderate pain or severe pain.   [name] ibuprofen (ADVIL,MOTRIN) 600 MG tablet Take 1 tablet (600 mg total) by mouth every 8 (eight) hours as needed.  [name] lisinopril (PRINIVIL,ZESTRIL) 10 MG tablet Take 20 mg by mouth daily.   . metFORMIN (GLUCOPHAGE) 500 MG tablet Take 500 mg by mouth daily.   . metoprolol succinate (TOPROL XL) 25 MG 24 hr tablet Take 0.5 tablets (12.5 mg total) by mouth daily.  [name] NITROSTAT 0.4 MG SL tablet Place 1 tablet under the tongue every 5 (five) minutes as needed.  [name] omeprazole (PRILOSEC) 20 MG capsule TAKE ONE CAPSULE BY MOUTH TWICE A DAY (Patient taking differently: TAKE 20 MG BY MOUTH TWICE DAILY)  . tiZANidine (ZANAFLEX) 4 MG capsule Take 4-8 mg by mouth daily as needed for muscle spasms. For muscle pain     Allergies  Allergen Reactions  . Penicillins Anaphylaxis and Swelling    Has patient had a PCN reaction causing immediate rash, facial/tongue/throat swelling, SOB or lightheadedness with hypotension: Yes Has patient had a PCN reaction causing severe rash involving mucus membranes or skin necrosis: No Has patient had a PCN reaction that required hospitalization: Yes Has patient had a PCN reaction occurring within the last 10 years: No    . Shellfish-Derived  Products Anaphylaxis and Swelling  . Sulfa Antibiotics Anaphylaxis and Swelling  . Morphine And Related Nausea And Vomiting    Social History   Socioeconomic History  . Marital status: Widowed    Spouse name: Not on file  . Number of children: 3  . Years of education: Not on file  . Highest education level: Not on file  Occupational History  . Occupation: Disabled  Social Needs  . Financial resource strain: Not on file  . Food insecurity:    Worry: Not on file    Inability: Not on file  . Transportation needs:    Medical: Not on file    Non-medical: Not on file    Tobacco Use  . Smoking status: Never Smoker  . Smokeless tobacco: Never Used  Substance and Sexual Activity  . Alcohol use: No  . Drug use: No  . Sexual activity: Yes    Birth control/protection: Surgical  Lifestyle  . Physical activity:    Days per week: Not on file    Minutes per session: Not on file  . Stress: Not on file  Relationships  . Social connections:    Talks on phone: Not on file    Gets together: Not on file    Attends religious service: Not on file    Active member of club or organization: Not on file    Attends meetings of clubs or organizations: Not on file    Relationship status: Not on file  . Intimate partner violence:    Fear of current or ex partner: Not on file    Emotionally abused: Not on file    Physically abused: Not on file    Forced sexual activity: Not on file  Other Topics Concern  . Not on file  Social History Narrative  . Not on file     Review of Systems: General: negative for chills, fever, night sweats or weight changes.  Cardiovascular: negative for chest pain, dyspnea on exertion, edema, orthopnea, palpitations, paroxysmal nocturnal dyspnea or shortness of breath Dermatological: negative for rash Respiratory: negative for cough or wheezing Urologic: negative for hematuria Abdominal: negative for nausea, vomiting, diarrhea, bright red blood per rectum, melena, or hematemesis Neurologic: negative for visual changes, syncope, or dizziness All other systems reviewed and are otherwise negative except as noted above.    Blood pressure 132/74, pulse (!) 45, height 5\' 5"  (1.651 m), weight 208 lb (94.3 kg).  General appearance: alert and no distress Neck: no adenopathy, no carotid bruit, no JVD, supple, symmetrical, trachea midline and thyroid not enlarged, symmetric, no tenderness/mass/nodules Lungs: clear to auscultation bilaterally Heart: regular rate and rhythm, S1, S2 normal, no murmur, click, rub or gallop Extremities: extremities  normal, atraumatic, no cyanosis or edema Pulses: 2+ and symmetric Skin: Skin color, texture, turgor normal. No rashes or lesions Neurologic: Alert and oriented X 3, normal strength and tone. Normal symmetric reflexes. Normal coordination and gait  EKG not performed today  ASSESSMENT AND PLAN:   HTN (hypertension) She of essential hypertension her blood pressure measured today 132/74.  She is on lisinopril and metoprolol.  Left ventricular dysfunction Recent 2D echo performed 04/23/2018 revealed a significant decrease in LV function with an EF of 20 to 25%.  This is compared to fairly normal EF and her 2D echo performed 10/21/2014.  She does complain of dyspnea on exertion, fatigue and some atypical chest pain.  Based on this, I recommended that we proceed with outpatient right and left heart cath via  the radial/brachial approach next week. The patient understands that risks included but are not limited to stroke (1 in 1000), death (1 in 53), kidney failure [usually temporary] (1 in 500), bleeding (1 in 200), allergic reaction [possibly serious] (1 in 200). The patient understands and agrees to proceed      Lorretta Harp MD [facility], [facility] 04/25/2018 3:50 PM

## 2018-04-25 NOTE — Patient Instructions (Signed)
Medication Instructions:  Your physician recommends that you continue on your current medications as directed. Please refer to the Current Medication list given to you today. If you need a refill on your cardiac medications before your next appointment, please call your pharmacy.   Lab work: Your physician recommends that you return for lab work in: TODAY  If you have labs (blood work) drawn today and your tests are completely normal, you will receive your results only by: [name] MyChart Message (if you have MyChart) OR . A paper copy in the mail If you have any lab test that is abnormal or we need to change your treatment, we will call you to review the results.  Testing/Procedures: none  Follow-Up: At [facility], you and your health needs are our priority.  As part of our continuing mission to provide you with exceptional heart care, we have created designated Provider Care Teams.  These Care Teams include your primary Cardiologist (physician) and Advanced Practice Providers (APPs -  Physician Assistants and Nurse Practitioners) who all work together to provide you with the care you need, when you need it. You will need a follow up appointment in 4 weeks from 10.28.2019.  Please call our office 2 months in advance to schedule this appointment.  You may see  Dr. Gwenlyn Found or one of the following Advanced Practice Providers on your designated Care Team:   Kerin Ransom, PA-C Roby Lofts, [place] . Sande Rives, PA-C  Any Other Special Instructions Will Be Listed Below (If Applicable).

## 2018-04-26 ENCOUNTER — Telehealth: Payer: Self-pay | Admitting: *Deleted

## 2018-04-26 ENCOUNTER — Encounter: Payer: Self-pay | Admitting: Cardiovascular Disease

## 2018-04-26 LAB — CBC WITH DIFFERENTIAL/PLATELET
BASOS ABS: 0 10*3/uL (ref 0.0–0.2)
Basos: 0 %
EOS (ABSOLUTE): 0.3 10*3/uL (ref 0.0–0.4)
EOS: 4 %
HEMOGLOBIN: 13.1 g/dL (ref 11.1–15.9)
Hematocrit: 39.1 % (ref 34.0–46.6)
IMMATURE GRANS (ABS): 0 10*3/uL (ref 0.0–0.1)
Immature Granulocytes: 0 %
Lymphocytes Absolute: 2.4 10*3/uL (ref 0.7–3.1)
Lymphs: 35 %
MCH: 28.2 pg (ref 26.6–33.0)
MCHC: 33.5 g/dL (ref 31.5–35.7)
MCV: 84 fL (ref 79–97)
MONOCYTES: 7 %
Monocytes Absolute: 0.5 10*3/uL (ref 0.1–0.9)
NEUTROS ABS: 3.7 10*3/uL (ref 1.4–7.0)
Neutrophils: 54 %
Platelets: 160 10*3/uL (ref 150–450)
RBC: 4.65 x10E6/uL (ref 3.77–5.28)
RDW: 12.9 % (ref 12.3–15.4)
WBC: 7 10*3/uL (ref 3.4–10.8)

## 2018-04-26 LAB — BASIC METABOLIC PANEL
BUN/Creatinine Ratio: 23 (ref 12–28)
BUN: 23 mg/dL (ref 8–27)
CO2: 24 mmol/L (ref 20–29)
CREATININE: 0.99 mg/dL (ref 0.57–1.00)
Calcium: 9.8 mg/dL (ref 8.7–10.3)
Chloride: 108 mmol/L — ABNORMAL HIGH (ref 96–106)
GFR calc non Af Amer: 59 mL/min/{1.73_m2} — ABNORMAL LOW (ref >59)
GFR, EST AFRICAN AMERICAN: 68 mL/min/{1.73_m2} (ref >59)
Glucose: 100 mg/dL — ABNORMAL HIGH (ref 65–99)
Potassium: 4.2 mmol/L (ref 3.5–5.2)
Sodium: 144 mmol/L (ref 134–144)

## 2018-04-26 LAB — TSH: TSH: 1.18 u[IU]/mL (ref 0.450–4.500)

## 2018-04-26 NOTE — Telephone Encounter (Signed)
I discussed instructions with patient, she verbalized understanding, thanked me for call.  

## 2018-04-26 NOTE — Telephone Encounter (Signed)
No answer/voicemail

## 2018-04-26 NOTE — Telephone Encounter (Signed)
Pt states she had significant allergic reaction to oral medication with iodine in the past.  Dr Gwenlyn Found recommended 13 hour prednisone and benadryl prep, I have reviewed these instructions with patient today.

## 2018-04-26 NOTE — Telephone Encounter (Addendum)
Pt contacted pre-catheterization scheduled at [facility] for: Monday April 30, 2018 1 PM Verified arrival time and place: [place] Entrance A at: 11 AM  No solid food after midnight prior to cath, clear liquids until 5 AM day of procedure. Contrast allergy: per Dr Ruben Reason hour Prednisone and Benadryl prep  Hold: Metformin-day of procedure and 48 hours post procedure. Lisinopril-day before and day of procedure. Furosemide-day before and day of procedure. Ibuprofen-day before and day of procedure.  Except hold medications AM meds can be  taken pre-cath with sip of water including: ASA 81 mg  Confirm patient has responsible person to drive home post procedure and for 24 hours after you arrive home  LMTCB to discuss instructions with patient.

## 2018-04-27 ENCOUNTER — Other Ambulatory Visit: Payer: Self-pay

## 2018-04-30 ENCOUNTER — Other Ambulatory Visit: Payer: Self-pay

## 2018-04-30 ENCOUNTER — Observation Stay (HOSPITAL_COMMUNITY)
Admission: RE | Admit: 2018-04-30 | Discharge: 2018-05-01 | Disposition: A | Discharge status: Home / Self Care | Payer: Medicare HMO | Source: Ambulatory Visit | Attending: Cardiovascular Disease | Admitting: Cardiovascular Disease

## 2018-04-30 ENCOUNTER — Encounter (HOSPITAL_COMMUNITY)
Admission: RE | Disposition: A | Discharge status: Home / Self Care | Payer: Self-pay | Source: Ambulatory Visit | Attending: Cardiovascular Disease

## 2018-04-30 DIAGNOSIS — I25119 Atherosclerotic heart disease of native coronary artery with unspecified angina pectoris: Principal | ICD-10-CM | POA: Insufficient documentation

## 2018-04-30 DIAGNOSIS — Z882 Allergy status to sulfonamides status: Secondary | ICD-10-CM | POA: Diagnosis not present

## 2018-04-30 DIAGNOSIS — R0609 Other forms of dyspnea: Secondary | ICD-10-CM | POA: Insufficient documentation

## 2018-04-30 DIAGNOSIS — Z9889 Other specified postprocedural states: Secondary | ICD-10-CM

## 2018-04-30 DIAGNOSIS — I110 Hypertensive heart disease with heart failure: Secondary | ICD-10-CM | POA: Insufficient documentation

## 2018-04-30 DIAGNOSIS — E785 Hyperlipidemia, unspecified: Secondary | ICD-10-CM | POA: Insufficient documentation

## 2018-04-30 DIAGNOSIS — I498 Other specified cardiac arrhythmias: Secondary | ICD-10-CM

## 2018-04-30 DIAGNOSIS — I5030 Unspecified diastolic (congestive) heart failure: Secondary | ICD-10-CM | POA: Diagnosis present

## 2018-04-30 DIAGNOSIS — I2510 Atherosclerotic heart disease of native coronary artery without angina pectoris: Secondary | ICD-10-CM

## 2018-04-30 DIAGNOSIS — E669 Obesity, unspecified: Secondary | ICD-10-CM

## 2018-04-30 DIAGNOSIS — Z7982 Long term (current) use of aspirin: Secondary | ICD-10-CM | POA: Diagnosis not present

## 2018-04-30 DIAGNOSIS — Z79899 Other long term (current) drug therapy: Secondary | ICD-10-CM | POA: Diagnosis not present

## 2018-04-30 DIAGNOSIS — Z885 Allergy status to narcotic agent status: Secondary | ICD-10-CM | POA: Insufficient documentation

## 2018-04-30 DIAGNOSIS — I428 Other cardiomyopathies: Secondary | ICD-10-CM

## 2018-04-30 DIAGNOSIS — E119 Type 2 diabetes mellitus without complications: Secondary | ICD-10-CM | POA: Insufficient documentation

## 2018-04-30 DIAGNOSIS — I10 Essential (primary) hypertension: Secondary | ICD-10-CM | POA: Diagnosis present

## 2018-04-30 DIAGNOSIS — I493 Ventricular premature depolarization: Secondary | ICD-10-CM | POA: Diagnosis present

## 2018-04-30 DIAGNOSIS — E1169 Type 2 diabetes mellitus with other specified complication: Secondary | ICD-10-CM | POA: Diagnosis present

## 2018-04-30 DIAGNOSIS — Z880 Allergy status to penicillin: Secondary | ICD-10-CM | POA: Diagnosis not present

## 2018-04-30 DIAGNOSIS — I519 Heart disease, unspecified: Secondary | ICD-10-CM | POA: Diagnosis not present

## 2018-04-30 DIAGNOSIS — Z91013 Allergy to seafood: Secondary | ICD-10-CM | POA: Diagnosis not present

## 2018-04-30 DIAGNOSIS — Z7984 Long term (current) use of oral hypoglycemic drugs: Secondary | ICD-10-CM | POA: Insufficient documentation

## 2018-04-30 DIAGNOSIS — I499 Cardiac arrhythmia, unspecified: Secondary | ICD-10-CM

## 2018-04-30 HISTORY — PX: RIGHT/LEFT HEART CATH AND CORONARY ANGIOGRAPHY: CATH118266

## 2018-04-30 HISTORY — DX: Adverse effect of unspecified anesthetic, initial encounter: T41.45XA

## 2018-04-30 HISTORY — DX: Nausea with vomiting, unspecified: R11.2

## 2018-04-30 HISTORY — DX: Other specified postprocedural states: Z98.890

## 2018-04-30 HISTORY — DX: Other complications of anesthesia, initial encounter: T88.59XA

## 2018-04-30 LAB — GLUCOSE, CAPILLARY
Glucose-Capillary: 167 mg/dL — ABNORMAL HIGH (ref 70–99)
Glucose-Capillary: 159 mg/dL — ABNORMAL HIGH (ref 70–99)
Glucose-Capillary: 121 mg/dL — ABNORMAL HIGH (ref 70–99)

## 2018-04-30 LAB — POCT I-STAT 3, VENOUS BLOOD GAS (G3P V)
ACID-BASE DEFICIT: 2 mmol/L (ref 0.0–2.0)
Acid-base deficit: 2 mmol/L (ref 0.0–2.0)
Bicarbonate: 22.7 mmol/L (ref 20.0–28.0)
Bicarbonate: 23.1 mmol/L (ref 20.0–28.0)
O2 Saturation: 68 %
O2 Saturation: 69 %
PH VEN: 7.384 (ref 7.250–7.430)
PH VEN: 7.388 (ref 7.250–7.430)
TCO2: 24 mmol/L (ref 22–32)
TCO2: 24 mmol/L (ref 22–32)
pCO2, Ven: 38 mmHg — ABNORMAL LOW (ref 44.0–60.0)
pCO2, Ven: 38.4 mmHg — ABNORMAL LOW (ref 44.0–60.0)
pO2, Ven: 36 mmHg (ref 32.0–45.0)
pO2, Ven: 36 mmHg (ref 32.0–45.0)

## 2018-04-30 LAB — POCT I-STAT 3, ART BLOOD GAS (G3+)
Acid-base deficit: 2 mmol/L (ref 0.0–2.0)
Bicarbonate: 22.4 mmol/L (ref 20.0–28.0)
O2 SAT: 91 %
PCO2 ART: 35.4 mmHg (ref 32.0–48.0)
TCO2: 23 mmol/L (ref 22–32)
pH, Arterial: 7.409 (ref 7.350–7.450)
pO2, Arterial: 61 mmHg — ABNORMAL LOW (ref 83.0–108.0)

## 2018-04-30 SURGERY — RIGHT/LEFT HEART CATH AND CORONARY ANGIOGRAPHY
Anesthesia: LOCAL

## 2018-04-30 MED ORDER — SODIUM CHLORIDE 0.9 % IV SOLN: 250.0000 mL | INTRAVENOUS | Status: DC | PRN

## 2018-04-30 MED ORDER — ONDANSETRON HCL 4 MG/2ML IJ SOLN: 4.0000 mg | Freq: Four times a day (QID) | INTRAMUSCULAR | Status: DC | PRN

## 2018-04-30 MED ORDER — ASPIRIN 81 MG PO CHEW
81.0000 mg | CHEWABLE_TABLET | Freq: Every day | ORAL | Status: DC
  Administered 2018-05-01: 81 mg via ORAL
  Filled 2018-04-30: qty 1

## 2018-04-30 MED ORDER — SODIUM CHLORIDE 0.9% FLUSH: 3.0000 mL | INTRAVENOUS | Status: DC | PRN

## 2018-04-30 MED ORDER — ALPRAZOLAM 0.5 MG PO TABS
0.5000 mg | ORAL_TABLET | Freq: Every day | ORAL | Status: DC | PRN
  Administered 2018-04-30: 0.5 mg via ORAL
  Filled 2018-04-30: qty 2
  Filled 2018-04-30: qty 1

## 2018-04-30 MED ORDER — HEPARIN (PORCINE) IN NACL 1000-0.9 UT/500ML-% IV SOLN
INTRAVENOUS | Status: DC | PRN
  Administered 2018-04-30 (×2): 500 mL

## 2018-04-30 MED ORDER — HEPARIN (PORCINE) IN NACL 1000-0.9 UT/500ML-% IV SOLN
INTRAVENOUS | Status: AC
  Filled 2018-04-30: qty 1000

## 2018-04-30 MED ORDER — ASPIRIN 81 MG PO TABS: 81.0000 mg | ORAL_TABLET | Freq: Every day | ORAL | Status: DC

## 2018-04-30 MED ORDER — MIDAZOLAM HCL 2 MG/2ML IJ SOLN
INTRAMUSCULAR | Status: AC
  Filled 2018-04-30: qty 2

## 2018-04-30 MED ORDER — HEPARIN SODIUM (PORCINE) 1000 UNIT/ML IJ SOLN
INTRAMUSCULAR | Status: DC | PRN
  Administered 2018-04-30: 4500 [IU] via INTRAVENOUS

## 2018-04-30 MED ORDER — MIDAZOLAM HCL 2 MG/2ML IJ SOLN
INTRAMUSCULAR | Status: DC | PRN
  Administered 2018-04-30: 1 mg via INTRAVENOUS

## 2018-04-30 MED ORDER — ASPIRIN 81 MG PO CHEW: 81.0000 mg | CHEWABLE_TABLET | ORAL | Status: DC

## 2018-04-30 MED ORDER — SODIUM CHLORIDE 0.9 % WEIGHT BASED INFUSION: 1.0000 mL/kg/h | INTRAVENOUS | Status: DC

## 2018-04-30 MED ORDER — SODIUM CHLORIDE 0.9% FLUSH
3.0000 mL | Freq: Two times a day (BID) | INTRAVENOUS | Status: DC
  Administered 2018-05-01: 3 mL via INTRAVENOUS

## 2018-04-30 MED ORDER — LIDOCAINE HCL (PF) 1 % IJ SOLN
INTRAMUSCULAR | Status: AC
  Filled 2018-04-30: qty 30

## 2018-04-30 MED ORDER — SODIUM CHLORIDE 0.9 % IV SOLN: INTRAVENOUS | Status: AC

## 2018-04-30 MED ORDER — PANTOPRAZOLE SODIUM 40 MG PO TBEC
40.0000 mg | DELAYED_RELEASE_TABLET | Freq: Every day | ORAL | Status: DC
  Administered 2018-05-01: 40 mg via ORAL
  Filled 2018-04-30: qty 1

## 2018-04-30 MED ORDER — INSULIN ASPART 100 UNIT/ML ~~LOC~~ SOLN: 0.0000 [IU] | Freq: Three times a day (TID) | SUBCUTANEOUS | Status: DC

## 2018-04-30 MED ORDER — SODIUM CHLORIDE 0.9% FLUSH: 3.0000 mL | Freq: Two times a day (BID) | INTRAVENOUS | Status: DC

## 2018-04-30 MED ORDER — IOHEXOL 350 MG/ML SOLN
INTRAVENOUS | Status: DC | PRN
  Administered 2018-04-30: 45 mL

## 2018-04-30 MED ORDER — HYDROCODONE-ACETAMINOPHEN 10-325 MG PO TABS
0.5000 | ORAL_TABLET | Freq: Four times a day (QID) | ORAL | Status: DC | PRN
  Administered 2018-04-30: 1 via ORAL
  Filled 2018-04-30 (×2): qty 1

## 2018-04-30 MED ORDER — PREDNISONE 50 MG PO TABS: 50.0000 mg | ORAL_TABLET | Freq: Every day | ORAL | Status: DC

## 2018-04-30 MED ORDER — SODIUM CHLORIDE 0.9 % WEIGHT BASED INFUSION
3.0000 mL/kg/h | INTRAVENOUS | Status: DC
  Administered 2018-04-30: 3 mL/kg/h via INTRAVENOUS

## 2018-04-30 MED ORDER — FENTANYL CITRATE (PF) 100 MCG/2ML IJ SOLN
INTRAMUSCULAR | Status: DC | PRN
  Administered 2018-04-30: 25 ug via INTRAVENOUS

## 2018-04-30 MED ORDER — ACETAMINOPHEN 325 MG PO TABS: 650.0000 mg | ORAL_TABLET | ORAL | Status: DC | PRN

## 2018-04-30 MED ORDER — NITROGLYCERIN 1 MG/10 ML FOR IR/CATH LAB
INTRA_ARTERIAL | Status: AC
  Filled 2018-04-30: qty 10

## 2018-04-30 MED ORDER — METOPROLOL SUCCINATE 12.5 MG HALF TABLET
12.5000 mg | ORAL_TABLET | Freq: Every day | ORAL | Status: DC
  Administered 2018-05-01: 12.5 mg via ORAL
  Filled 2018-04-30: qty 1

## 2018-04-30 MED ORDER — FENTANYL CITRATE (PF) 100 MCG/2ML IJ SOLN
INTRAMUSCULAR | Status: AC
  Filled 2018-04-30: qty 2

## 2018-04-30 MED ORDER — HYDRALAZINE HCL 20 MG/ML IJ SOLN
INTRAMUSCULAR | Status: DC | PRN
  Administered 2018-04-30: 10 mg via INTRAVENOUS

## 2018-04-30 MED ORDER — BUSPIRONE HCL 10 MG PO TABS
10.0000 mg | ORAL_TABLET | Freq: Two times a day (BID) | ORAL | Status: DC | PRN
  Filled 2018-04-30: qty 1

## 2018-04-30 MED ORDER — INSULIN ASPART 100 UNIT/ML ~~LOC~~ SOLN: 0.0000 [IU] | Freq: Every day | SUBCUTANEOUS | Status: DC

## 2018-04-30 MED ORDER — LIDOCAINE HCL (PF) 1 % IJ SOLN
INTRAMUSCULAR | Status: DC | PRN
  Administered 2018-04-30 (×2): 2 mL

## 2018-04-30 MED ORDER — VERAPAMIL HCL 2.5 MG/ML IV SOLN
INTRA_ARTERIAL | Status: DC | PRN
  Administered 2018-04-30: 7.5 mL via INTRA_ARTERIAL

## 2018-04-30 MED ORDER — VERAPAMIL HCL 2.5 MG/ML IV SOLN
INTRAVENOUS | Status: AC
  Filled 2018-04-30: qty 2

## 2018-04-30 SURGICAL SUPPLY — 14 items
CATH BALLN WEDGE 5F 110CM (CATHETERS) ×1 IMPLANT
CATH INFINITI 5FR ANG PIGTAIL (CATHETERS) ×1 IMPLANT
CATH OPTITORQUE TIG 4.0 5F (CATHETERS) ×1 IMPLANT
DEVICE RAD COMP TR BAND LRG (VASCULAR PRODUCTS) ×1 IMPLANT
GLIDESHEATH SLEND A-KIT 6F 22G (SHEATH) ×1 IMPLANT
GUIDEWIRE INQWIRE 1.5J.035X260 (WIRE) IMPLANT
INQWIRE 1.5J .035X260CM (WIRE) ×2
KIT HEART LEFT (KITS) ×2 IMPLANT
PACK CARDIAC CATHETERIZATION (CUSTOM PROCEDURE TRAY) ×2 IMPLANT
SHEATH GLIDE SLENDER 4/5FR (SHEATH) ×1 IMPLANT
SYR MEDRAD MARK V 150ML (SYRINGE) ×2 IMPLANT
TRANSDUCER W/STOPCOCK (MISCELLANEOUS) ×2 IMPLANT
TUBING CIL FLEX 10 FLL-RA (TUBING) ×2 IMPLANT
WIRE HI TORQ VERSACORE-J 145CM (WIRE) ×1 IMPLANT

## 2018-04-30 NOTE — Interval H&P Note (Signed)
Cath Lab Visit (complete for each Cath Lab visit)  Clinical Evaluation Leading to the Procedure:   ACS: No.  Non-ACS:    Anginal Classification: CCS II  Anti-ischemic medical therapy: No Therapy  Non-Invasive Test Results: No non-invasive testing performed  Prior CABG: No previous CABG      History and Physical Interval Note:  04/30/2018 1:36 PM  [name]  has presented today for surgery, with the diagnosis of lv dysfunction  The various methods of treatment have been discussed with the patient and family. After consideration of risks, benefits and other options for treatment, the patient has consented to  Procedure(s): RIGHT/LEFT HEART CATH AND CORONARY ANGIOGRAPHY (N/A) as a surgical intervention .  The patient's history has been reviewed, patient examined, no change in status, stable for surgery.  I have reviewed the patient's chart and labs.  Questions were answered to the patient's satisfaction.     Quay Burow

## 2018-04-30 NOTE — Progress Notes (Signed)
Pt c/o continued intermittent surge of pain that is behind both eyes and goes into right ear and neck. States it is different from any migraine she has ever experienced, daughters state she seems worse now than on arrival today. No deficits noted, equal grips bilateral, smile equal,  Pupils have brisk response to light. VSS. On cal cardiology Mickel Baas I pa was called, asked for EKG and would speak with MD and call back with further instructions. Pt is in no signs of distress currently.

## 2018-04-30 NOTE — Progress Notes (Signed)
[name] in to see client  Earlier and orders to admit; client c/o 8/10 headache and medication given as ordered and xanax given po per Mickel Baas, NP

## 2018-04-30 NOTE — Progress Notes (Signed)
Transferred via w/c to 3-E-20 and report given to RN for 3-E-20

## 2018-04-30 NOTE — Research (Signed)
[place] Informed Consent   Subject Name: [name]  Subject met inclusion and exclusion criteria.  The informed consent form, study requirements and expectations were reviewed with the subject and questions and concerns were addressed prior to the signing of the consent form.  The subject verbalized understanding of the trail requirements.  The subject agreed to participate in the [facility] trial and signed the informed consent.  The informed consent was obtained prior to performance of any protocol-specific procedures for the subject.  A copy of the signed informed consent was given to the subject and a copy was placed in the subject's medical record.  Hedrick,Turner Baillie W 04/30/2018, 1245

## 2018-04-30 NOTE — Progress Notes (Signed)
Pt c/o head pressure and feeling funny. VSS except bigeminal PVC's, [name] cardiology was called and pt has HX of frequent pvc's prior to [facility]. Continue to monitor no further orders.

## 2018-04-30 NOTE — Discharge Instructions (Signed)

## 2018-05-01 ENCOUNTER — Other Ambulatory Visit: Payer: Self-pay | Admitting: Physician Assistant

## 2018-05-01 ENCOUNTER — Other Ambulatory Visit: Payer: Self-pay

## 2018-05-01 ENCOUNTER — Encounter (HOSPITAL_COMMUNITY): Payer: Self-pay

## 2018-05-01 DIAGNOSIS — I10 Essential (primary) hypertension: Secondary | ICD-10-CM | POA: Diagnosis not present

## 2018-05-01 DIAGNOSIS — I25119 Atherosclerotic heart disease of native coronary artery with unspecified angina pectoris: Secondary | ICD-10-CM | POA: Diagnosis not present

## 2018-05-01 DIAGNOSIS — E785 Hyperlipidemia, unspecified: Secondary | ICD-10-CM | POA: Diagnosis not present

## 2018-05-01 DIAGNOSIS — I498 Other specified cardiac arrhythmias: Secondary | ICD-10-CM

## 2018-05-01 DIAGNOSIS — Z9889 Other specified postprocedural states: Secondary | ICD-10-CM | POA: Diagnosis not present

## 2018-05-01 DIAGNOSIS — I519 Heart disease, unspecified: Secondary | ICD-10-CM | POA: Diagnosis not present

## 2018-05-01 DIAGNOSIS — I428 Other cardiomyopathies: Secondary | ICD-10-CM

## 2018-05-01 DIAGNOSIS — I499 Cardiac arrhythmia, unspecified: Principal | ICD-10-CM

## 2018-05-01 DIAGNOSIS — I5022 Chronic systolic (congestive) heart failure: Secondary | ICD-10-CM

## 2018-05-01 MED ORDER — SACUBITRIL-VALSARTAN 24-26 MG PO TABS
1.0000 | ORAL_TABLET | Freq: Two times a day (BID) | ORAL | Status: DC
  Filled 2018-05-01: qty 1

## 2018-05-01 MED ORDER — SACUBITRIL-VALSARTAN 24-26 MG PO TABS: 1.0000 | ORAL_TABLET | Freq: Two times a day (BID) | ORAL | 0 refills | Status: DC

## 2018-05-01 MED ORDER — ATORVASTATIN CALCIUM 40 MG PO TABS: 40.0000 mg | ORAL_TABLET | Freq: Every day | ORAL | 3 refills | Status: AC

## 2018-05-01 MED ORDER — INFLUENZA VAC SPLIT HIGH-DOSE 0.5 ML IM SUSY: 0.5000 mL | PREFILLED_SYRINGE | INTRAMUSCULAR | Status: DC

## 2018-05-01 MED ORDER — SACUBITRIL-VALSARTAN 24-26 MG PO TABS: 1.0000 | ORAL_TABLET | Freq: Two times a day (BID) | ORAL | 11 refills | Status: DC

## 2018-05-01 MED ORDER — ATORVASTATIN CALCIUM 40 MG PO TABS
40.0000 mg | ORAL_TABLET | Freq: Every day | ORAL | Status: DC
  Filled 2018-05-01: qty 1

## 2018-05-01 NOTE — Discharge Summary (Signed)
Discharge Summary    Patient ID: [name] MRN: [id number]; DOB: 11-20-1950  Admit date: 04/30/2018 Discharge date: 05/01/2018  Primary Care Provider: Secundino Ginger, PA-C  Primary Cardiologist: Quay Burow, MD  Primary Electrophysiologist:  None   Discharge Diagnoses    Principal Problem:   Status post cardiac catheterization Active Problems:   HTN (hypertension)   Diabetes mellitus type 2 in obese ([place])   HLD (hyperlipidemia)   PVC's (premature ventricular contractions)   Dyslipidemia   NICM (nonischemic cardiomyopathy) ([place])   Bigeminy   Allergies Allergies  Allergen Reactions  . Penicillins Anaphylaxis and Swelling    Has patient had a PCN reaction causing immediate rash, facial/tongue/throat swelling, SOB or lightheadedness with hypotension: Yes Has patient had a PCN reaction causing severe rash involving mucus membranes or skin necrosis: No Has patient had a PCN reaction that required hospitalization: Yes Has patient had a PCN reaction occurring within the last 10 years: No    . Shellfish-Derived Products Anaphylaxis and Swelling  . Sulfa Antibiotics Anaphylaxis and Swelling  . Morphine And Related Nausea And Vomiting    Diagnostic Studies/Procedures    Cath 04/30/2018  Mid RCA lesion is 20% stenosed.  Prox LAD lesion is 30% stenosed.  There is moderate left ventricular systolic dysfunction.  LV end diastolic pressure is normal.  The left ventricular ejection fraction is 35-45% by visual estimate. _____________   History of Present Illness     [name] is a 67 y.o.  severely overweight married Caucasian female mother of 3 children (1 deceased) 02/19/23 last saw in the office 03/20/2018. Unfortunately, her husband of 40 years passed away in 10/20/15 she is still grieving. Shewasalso taking care of her elderly motherhas since passed away June 13, 2023. Her sister also moved in with her. Dr. Gwenlyn Found last saw her in the office 03/17/2017.[name] Her  primary care provider is Roque Cash. She has a history of hypertension, hyperlipidemia, and diabetes. She has never had a heart attack or stroke. She does have sleep apnea. She has had 2 normal cardiac catheterizations in 2007-10-20 and 14 respectively. She does get occasional atypical chest pain.Since Dr. Gwenlyn Found saw her year ago she's remained stable with occasional episodes of chest pain Since I saw her a year ago she is remained stable. She has episodic atypical chest pain as well. She is also recently complained of palpitations which I suspect her PVCs. She is under a lot of stress and she has high anxiety. She had a 2D echo performed 04/23/2018 revealing a significant decline in her LVEF to 20 to 25% compared to her previous echo performed 10/21/2014 which was essentially normal.  She does complain of dyspnea on exertion and atypical chest pain.   Hospital Course     She presented for outpatient cardiac catheterization on 04/22/2018, this revealed EF 35 to 45%, 20% mid RCA lesion, 30% proximal LAD lesion.  She was supposed to be discharged post-cath, however due to worsening pain behind her eyes and in the head, she was a kept overnight for observation.  Note, patient was started on Toprol-XL 12.5 mg daily in the past month for her LV dysfunction.  She is also on lisinopril 10 mg daily.  Due to persistent LV dysfunction, we plan to start the patient on Entresto 24-26 mg twice daily.  She will be discharged with [facility] coupon.  I plan to have the patient return to the office in 1 week for basic metabolic panel.  She will see our clinical  pharmacist in 2 weeks for up titration of the Entresto and to see if she qualify for any medication assistance program.  Note, during this admission is also noted that she has very frequent bigeminy which may be a cause for her LV dysfunction.  We plan to discharge the patient on 24-hour Holter monitor to assess the PVC burden.  If her PVC burden is high, we will refer the  patient to electrophysiology service to consider antiarrhythmic therapy for suppression.  She has been started on 40 mg daily of Lipitor.  If this will need to be followed by her primary care provider.  She was seen in the morning of 05/01/2018, at which time she denied any further headache.  She is feeling well without any chest pain or shortness of breath.  She is deemed stable for discharge from cardiology perspective.  _____________  Discharge Vitals Blood pressure (!) 134/50, pulse 75, temperature 98 F (36.7 C), temperature source Oral, resp. rate 18, height 5\' 5"  (1.651 m), weight 92.5 kg, SpO2 98 %.  Filed Weights   04/30/18 1121 05/01/18 0322  Weight: 93 kg 92.5 kg    Labs & Radiologic Studies    CBC No results for input(s): WBC, NEUTROABS, HGB, HCT, MCV, PLT in the last 72 hours. Basic Metabolic Panel No results for input(s): NA, K, CL, CO2, GLUCOSE, BUN, CREATININE, CALCIUM, MG, PHOS in the last 72 hours. Liver Function Tests No results for input(s): AST, ALT, ALKPHOS, BILITOT, PROT, ALBUMIN in the last 72 hours. No results for input(s): LIPASE, AMYLASE in the last 72 hours. Cardiac Enzymes No results for input(s): CKTOTAL, CKMB, CKMBINDEX, TROPONINI in the last 72 hours. BNP Invalid input(s): POCBNP D-Dimer No results for input(s): DDIMER in the last 72 hours. Hemoglobin A1C No results for input(s): HGBA1C in the last 72 hours. Fasting Lipid Panel No results for input(s): CHOL, HDL, LDLCALC, TRIG, CHOLHDL, LDLDIRECT in the last 72 hours. Thyroid Function Tests No results for input(s): TSH, T4TOTAL, T3FREE, THYROIDAB in the last 72 hours.  Invalid input(s): FREET3 _____________  No results found. Disposition   Pt is being discharged home today in good condition.  Follow-up Plans & Appointments    Follow-up Information    Secundino Ginger, PA-C.   Specialty:  Cardiology Contact information: [facility]  [address] [place]  [address] [phone]        [facility] Follow up on 05/08/2018.   Specialty:  Cardiology Why:  1 week BMET labwork at [facility] clinic, no fasting needed, obtain during lab hour anytime between 8AM until 4:30PM.  Contact information: [place] [phone]       [place] Office Follow up on 05/04/2018.   Specialty:  Cardiology Why:  4:00PM. Arrive by 3:45PM. Obtain 24 holter monitor at [facility] office (note the above address is different from Dr. Kennon Holter office, make sure you go to above address) Contact information: [address], [address] [place]       Lorretta Harp, MD Follow up on 05/29/2018.   Specialties:  Cardiology, Radiology Why:  2:15PM. Contact information: [place] [address] [phone]        Almyra Deforest, [place] Follow up on 05/15/2018.   Specialties:  Cardiology, Radiology Why:  See clinical pharmacist in Dr. Kennon Holter office at [facility] to titrate [facility] and figure out if qualify for medication assistance program. Note,  name of pharmacist is different than above name Contact information: [address] [place] [address] [phone]            Discharge Medications   Allergies as of 05/01/2018      Reactions   Penicillins Anaphylaxis, Swelling   Has patient had a PCN reaction causing immediate rash, facial/tongue/throat swelling, SOB or lightheadedness with hypotension: Yes Has patient had a PCN reaction causing severe rash involving mucus membranes or skin necrosis: No Has patient had a PCN reaction that required hospitalization: Yes Has patient had a PCN reaction occurring within the last 10 years: No   Shellfish-derived Products Anaphylaxis, Swelling   Sulfa Antibiotics Anaphylaxis, Swelling   Morphine And Related Nausea And Vomiting      Medication List    STOP taking  these medications   lisinopril 10 MG tablet Commonly known as:  PRINIVIL,ZESTRIL     TAKE these medications   ALPRAZolam 1 MG tablet Commonly known as:  XANAX Take 0.5-1 mg by mouth daily as needed for anxiety.   aspirin 81 MG tablet Take 81 mg by mouth daily.   atorvastatin 40 MG tablet Commonly known as:  LIPITOR Take 1 tablet (40 mg total) by mouth daily at 6 PM.   busPIRone 10 MG tablet Commonly known as:  BUSPAR Take 10 mg by mouth 2 (two) times daily as needed (anxiety).   cholecalciferol 1000 units tablet Commonly known as:  VITAMIN D Take 1,000 Units by mouth daily.   diclofenac 75 MG EC tablet Commonly known as:  VOLTAREN Take 75 mg by mouth 2 (two) times daily.   diphenhydrAMINE 50 MG capsule Commonly known as:  BENADRYL Take 50 mg by mouth once.   furosemide 20 MG tablet Commonly known as:  LASIX Take 1 tablet (20 mg total) by mouth daily.   HYDROcodone-acetaminophen 10-325 MG tablet Commonly known as:  NORCO Take 0.5-1 tablets by mouth every 6 (six) hours as needed for moderate pain or severe pain.   metFORMIN 500 MG tablet Commonly known as:  GLUCOPHAGE Take 500 mg by mouth See admin instructions. PT only takes with a big meal   metoprolol succinate 25 MG 24 hr tablet Commonly known as:  TOPROL-XL Take 0.5 tablets (12.5 mg total) by mouth daily.   NITROSTAT 0.4 MG SL tablet Generic drug:  nitroGLYCERIN Place 1 tablet under the tongue every 5 (five) minutes as needed for chest pain.   omeprazole 20 MG capsule Commonly known as:  PRILOSEC TAKE ONE CAPSULE BY MOUTH TWICE A DAY What changed:  when to take this   predniSONE 50 MG tablet Commonly known as:  DELTASONE Take 50 mg by mouth daily with breakfast. 10/27 @@12  am 10/28 0600, then 10 am   sacubitril-valsartan 24-26 MG Commonly known as:  ENTRESTO Take 1 tablet by mouth 2 (two) times daily.   tiZANidine 4 MG capsule Commonly known as:  ZANAFLEX Take 4-8 mg by mouth daily as needed for  muscle spasms. For muscle pain        Acute coronary syndrome (MI, NSTEMI, STEMI, etc) this admission?: No.    Outstanding Labs/Studies   24 hour holter monitor 1 week BMET  Duration of Discharge Encounter   Greater than 30 minutes including physician time.  Hilbert Corrigan, PA 05/01/2018, 4:19 PM

## 2018-05-01 NOTE — Progress Notes (Signed)
Patient ready for discharge. All personal belongings with patient. No c/o.  Discharge medications, follow up appts and instructions reviewed with patient.   Emphasis placed on keeping follow up appts and medication compliance.

## 2018-05-01 NOTE — Progress Notes (Signed)
Progress Note  Patient Name: RHANDI DESPAIN Date of Encounter: 05/01/2018  Primary Cardiologist: Quay Burow, MD   Subjective   Denies any CP or SOB. Has been having some worsening fatigue.  Inpatient Medications    Scheduled Meds: . aspirin  81 mg Oral Daily  . [START ON 05/02/2018] Influenza vac split quadrivalent PF  0.5 mL Intramuscular Tomorrow-1000  . insulin aspart  0-5 Units Subcutaneous QHS  . insulin aspart  0-9 Units Subcutaneous TID WC  . metoprolol succinate  12.5 mg Oral Daily  . pantoprazole  40 mg Oral Daily  . sodium chloride flush  3 mL Intravenous Q12H   Continuous Infusions: . sodium chloride     PRN Meds: sodium chloride, acetaminophen, ALPRAZolam, busPIRone, HYDROcodone-acetaminophen, ondansetron (ZOFRAN) IV, sodium chloride flush   Vital Signs    Vitals:   05/01/18 0051 05/01/18 0322 05/01/18 0325 05/01/18 0333  BP:   133/71   Pulse: 74  (!) 42 76  Resp:   12   Temp:   (!) 97.5 F (36.4 C)   TempSrc:   Oral   SpO2:   97%   Weight:  92.5 kg    Height:        Intake/Output Summary (Last 24 hours) at 05/01/2018 1224 Last data filed at 05/01/2018 0900 Gross per 24 hour  Intake 390 ml  Output -  Net 390 ml   Filed Weights   04/30/18 1121 05/01/18 0322  Weight: 93 kg 92.5 kg    Telemetry    Bigeminy - Personally Reviewed  ECG    Bigeminy - Personally Reviewed  Physical Exam   GEN: No acute distress.   Neck: No JVD Cardiac: RRR, no murmurs, rubs, or gallops.  Respiratory: Clear to auscultation bilaterally. GI: Soft, nontender, non-distended  MS: No edema; No deformity. Neuro:  Nonfocal  Psych: Normal affect   Labs    Chemistry Recent Labs  Lab 04/25/18 1646  NA 144  K 4.2  CL 108*  CO2 24  GLUCOSE 100*  BUN 23  CREATININE 0.99  CALCIUM 9.8  GFRNONAA 59*  GFRAA 68     Hematology Recent Labs  Lab 04/25/18 1646  WBC 7.0  RBC 4.65  HGB 13.1  HCT 39.1  MCV 84  MCH 28.2  MCHC 33.5  RDW 12.9  PLT  160    Cardiac EnzymesNo results for input(s): TROPONINI in the last 168 hours. No results for input(s): TROPIPOC in the last 168 hours.   BNPNo results for input(s): BNP, PROBNP in the last 168 hours.   DDimer No results for input(s): DDIMER in the last 168 hours.   Radiology    No results found.  Cardiac Studies   Cath 04/30/2018  Mid RCA lesion is 20% stenosed.  Prox LAD lesion is 30% stenosed.  There is moderate left ventricular systolic dysfunction.  LV end diastolic pressure is normal.  The left ventricular ejection fraction is 35-45% by visual estimate.  Patient Profile     67 y.o. female with PMH of CAD, HTN, HLD and DM II who recently noted to have drop in EP on echo to 20-25%. She presented for outpatient cath.  Assessment & Plan    1. NICM  - Echo 04/23/2018 EF 20-25%, mild MR and mild TR. PA peak pressure 22 mmHg  - cath 04/30/2018 showed EF 35-45%, 20% mRCA, 30% pLAD.  - started on low dose toprol XL this past month, home lisinopril has been held. She has been on  lisinopril for many year, recommend start on Entresto 24-26mg  BID   2. Bigeminy: Looking at her telemetry, she stays in [place]. Looking back in the chart, she was in bigeminy on last office visit. She had Bigeminy and trigeminy on EKG in 2018 as well. I think she stays in Bigeminy and this frequent ventricular ectopy is causing her EF to drop. Will discuss with MD regarding EP consult  3. Headache: D/C delayed due to headache. Resolved  4. CAD: nonobstructive by cath.    5. HTN: elevated, on low dose toprol XL, home lisinopril on hold after cath.   6. HLD: not on statin at home. Consider FLP and initiate statin therapy.   7. DM II  For questions or updates, please contact [place] Please consult [email or web address] for contact info under        Signed, Almyra Deforest, [place]  05/01/2018, 12:24 PM

## 2018-05-01 NOTE — Progress Notes (Signed)
Entresto coupon card given to patient with explanation of usage; Zadie Rhine [phone]

## 2018-05-01 NOTE — Plan of Care (Signed)
  Problem: Education: Goal: Knowledge of General Education information will improve Description: Including pain rating scale, medication(s)/side effects and non-pharmacologic comfort measures Outcome: Progressing   Problem: Clinical Measurements: Goal: Ability to maintain clinical measurements within normal limits will improve Outcome: Progressing   Problem: Coping: Goal: Level of anxiety will decrease Outcome: Progressing   Problem: Pain Managment: Goal: General experience of comfort will improve Outcome: Progressing   Problem: Skin Integrity: Goal: Risk for impaired skin integrity will decrease Outcome: Progressing   

## 2018-05-02 ENCOUNTER — Telehealth: Payer: Self-pay | Admitting: Cardiovascular Disease

## 2018-05-02 NOTE — Telephone Encounter (Signed)
Patient states her entresto was almost $400. She was discharged from hospital yesterday. She was given a co-pay card, does not think she got a free 30 day voucher. Advised will leave patient assistance application & free 30 day card for patient @ front desk  Routed to [employer] as Juluis Rainier

## 2018-05-02 NOTE — Telephone Encounter (Signed)
New Message   Pt c/o medication issue:  1. Name of Medication: sacubitril-valsartan (ENTRESTO) 24-26 MG  2. How are you currently taking this medication (dosage and times per day)? Take 1 tablet by mouth 2 (two) times daily  3. Are you having a reaction (difficulty breathing--STAT)? no  4. What is your medication issue? Pt states that the medication is too expensive so she faxed some assistance forms over and wants to know the status. Please call

## 2018-05-04 ENCOUNTER — Ambulatory Visit (INDEPENDENT_AMBULATORY_CARE_PROVIDER_SITE_OTHER): Payer: Medicare HMO

## 2018-05-04 DIAGNOSIS — I499 Cardiac arrhythmia, unspecified: Secondary | ICD-10-CM | POA: Diagnosis not present

## 2018-05-04 DIAGNOSIS — I498 Other specified cardiac arrhythmias: Secondary | ICD-10-CM

## 2018-05-08 ENCOUNTER — Other Ambulatory Visit: Payer: Self-pay

## 2018-05-08 DIAGNOSIS — I5022 Chronic systolic (congestive) heart failure: Secondary | ICD-10-CM

## 2018-05-09 LAB — BASIC METABOLIC PANEL
BUN / CREAT RATIO: 23 (ref 12–28)
BUN: 23 mg/dL (ref 8–27)
CO2: 24 mmol/L (ref 20–29)
Calcium: 9 mg/dL (ref 8.7–10.3)
Chloride: 104 mmol/L (ref 96–106)
Creatinine, Ser: 0.98 mg/dL (ref 0.57–1.00)
GFR calc Af Amer: 69 mL/min/{1.73_m2} (ref >59)
GFR, EST NON AFRICAN AMERICAN: 60 mL/min/{1.73_m2} (ref >59)
GLUCOSE: 94 mg/dL (ref 65–99)
POTASSIUM: 4 mmol/L (ref 3.5–5.2)
Sodium: 143 mmol/L (ref 134–144)

## 2018-05-14 ENCOUNTER — Telehealth: Payer: Self-pay | Admitting: Cardiovascular Disease

## 2018-05-14 NOTE — Telephone Encounter (Signed)
New Message  Pt c/o medication issue:  1. Name of Medication: sacubitril-valsartan (ENTRESTO) 24-26 MG  2. How are you currently taking this medication (dosage and times per day)? Take 1 tablet by mouth 2 (two) times daily  3. Are you having a reaction (difficulty breathing--STAT)? no  4. What is your medication issue? Alene from cover my meds is calling to confirmed that prior authorization was received. Please call

## 2018-05-14 NOTE — Telephone Encounter (Signed)
Returned call to cover my meds.They will be sending a prior authorization form to be completed for Entresto.Ref # [id number].

## 2018-05-15 ENCOUNTER — Ambulatory Visit (INDEPENDENT_AMBULATORY_CARE_PROVIDER_SITE_OTHER): Payer: Medicare HMO | Admitting: Pharmacist

## 2018-05-15 VITALS — BP 118/62 | HR 48 | Ht 65.0 in | Wt 202.4 lb

## 2018-05-15 DIAGNOSIS — I519 Heart disease, unspecified: Secondary | ICD-10-CM | POA: Diagnosis not present

## 2018-05-15 NOTE — Telephone Encounter (Signed)
Patient started on Entrsto sat the hospital. No adjustment done today.  Patient stated had patient assistance paperwork completed last week. Please follow up. We don't have paperwork for PA nor patient assistance.

## 2018-05-15 NOTE — Progress Notes (Signed)
Patient ID: [name]                 DOB: [birth year]                      MRN: [id number]     HPI: [name] is a 67 y.o. female patient of DR Gwenlyn Found referred by Almyra Deforest PA to HTN clinic. PMH includes hypertension, nonischemic cardiomyopathy, DM-II, hyperlipidemia, and chest pain. Noted hospital admission on 04/22/18 for catheterization. Due to persistent LV dysfunction and EF of 20% per ECHO performed on 04/23/2018, patietn was discharged from hospital with Entresto 24.26mg  twice daily. She presents to pharmacist clinic for medication titration and BP monitoring.  BMET repeat done 2 weeks after Entresto initiation showed stable renal function and electrolytes but patient reports missing 3 days of ENTRESTO at the beginning of the therapy due to confusion. Se still very tires and reports some shortness of breath.  Patient received 30 days oif Entresto 24/26 free of charge and patient assistance paperwork was returned tpo clinic for completion.   Current HTN meds:  Furosemide 20mg  daily (skips once on a while) Entresto 24-26mg  twice daily Metoprolol 12.5mg  daily  BP goal: 130/80  Social History: denies tobacco or alcohol use  Diet: working on balanced low sodium diet  Exercise: activities of daily living  Home BP readings: none available (not monitoring weight or BP at home; scale and BP cuff available)  Wt Readings from Last 3 Encounters:  05/15/18 202 lb 6.4 oz (91.8 kg)  05/01/18 203 lb 14.4 oz (92.5 kg)  04/25/18 208 lb (94.3 kg)   BP Readings from Last 3 Encounters:  05/15/18 118/62  05/01/18 (!) 134/50  04/25/18 132/74   Pulse Readings from Last 3 Encounters:  05/15/18 (!) 48  05/01/18 75  04/25/18 (!) 45    Renal function: Estimated Creatinine Clearance: 62.3 mL/min (by C-G formula based on SCr of 0.98 mg/dL).  Past Medical History:  Diagnosis Date  . Adenomatous colon polyp 2006  . Arrhythmia    Heart  . Arthritis   . Asthma   . Chronic headaches   .  Complication of anesthesia   . Diabetes mellitus type II, controlled ([place])    Diet  . Gastric polyp    hyperplastic  . GERD (gastroesophageal reflux disease)   . Headache   . Hyperlipidemia   . Hypertension   . IBS (irritable bowel syndrome)   . Kidney stones   . Lymphocytic colitis   . Myocardial infarct ([place])   . Neuropathy, peripheral   . PONV (postoperative nausea and vomiting)   . Sleep apnea    no CPAP  . Ulcer   . Urinary tract infection     Current Outpatient Medications on File Prior to Visit  Medication Sig Dispense Refill  . ALPRAZolam (XANAX) 1 MG tablet Take 0.5-1 mg by mouth daily as needed for anxiety.     [name] aspirin 81 MG tablet Take 81 mg by mouth daily.     [name] atorvastatin (LIPITOR) 40 MG tablet Take 1 tablet (40 mg total) by mouth daily at 6 PM. 90 tablet 3  . Biotin 1 MG CAPS Take 1 capsule by mouth daily.    . busPIRone (BUSPAR) 10 MG tablet Take 10 mg by mouth 2 (two) times daily as needed (anxiety).     . cholecalciferol (VITAMIN D) 1000 UNITS tablet Take 1,000 Units by mouth daily.     . diclofenac (VOLTAREN)  75 MG EC tablet Take 75 mg by mouth 2 (two) times daily.    . furosemide (LASIX) 20 MG tablet Take 1 tablet (20 mg total) by mouth daily. 90 tablet 2  . HYDROcodone-acetaminophen (NORCO) 10-325 MG per tablet Take 0.5-1 tablets by mouth every 6 (six) hours as needed for moderate pain or severe pain.     . metFORMIN (GLUCOPHAGE) 500 MG tablet Take 500 mg by mouth See admin instructions. PT only takes with a big meal    . metoprolol succinate (TOPROL XL) 25 MG 24 hr tablet Take 0.5 tablets (12.5 mg total) by mouth daily. 45 tablet 3  . omeprazole (PRILOSEC) 20 MG capsule TAKE ONE CAPSULE BY MOUTH TWICE A DAY (Patient taking differently: Take 20 mg by mouth 2 (two) times daily before a meal. ) 60 capsule 3  . sacubitril-valsartan (ENTRESTO) 24-26 MG Take 1 tablet by mouth 2 (two) times daily. 60 tablet 11  . tiZANidine (ZANAFLEX) 4 MG capsule Take 4-8 mg by  mouth daily as needed for muscle spasms. For muscle pain    . NITROSTAT 0.4 MG SL tablet Place 1 tablet under the tongue every 5 (five) minutes as needed for chest pain.   0   No current facility-administered medications on file prior to visit.     Allergies  Allergen Reactions  . Penicillins Anaphylaxis and Swelling    Has patient had a PCN reaction causing immediate rash, facial/tongue/throat swelling, SOB or lightheadedness with hypotension: Yes Has patient had a PCN reaction causing severe rash involving mucus membranes or skin necrosis: No Has patient had a PCN reaction that required hospitalization: Yes Has patient had a PCN reaction occurring within the last 10 years: No    . Shellfish-Derived Products Anaphylaxis and Swelling  . Sulfa Antibiotics Anaphylaxis and Swelling  . Morphine And Related Nausea And Vomiting    Blood pressure 118/62, pulse (!) 48, height 5\' 5"  (1.651 m), weight 202 lb 6.4 oz (91.8 kg), SpO2 97 %.  Left ventricular dysfunction Blood pressure and renal function remain appropriate for Entresto 24/26mg  twice daily but patient still experiencing symptoms of tiredness and shortness of breath. She is not monitoring her BP or weight at home but has scale and BP monitor at home. Due to unstable symptoms today and stable BP , I will continue current therapy without changes and follow up in 2 weeks as previously scheduled. She is monitor BP and weight daily at home and bring records to next office visit. Plan to initiate spironolactone 21.5mg  daily during next office visit if vital remains stable and no additional need for diuresis noted.  [name] PharmD, BCPS, [place] [id number] 05/18/2018 7:40 AM

## 2018-05-15 NOTE — Patient Instructions (Addendum)
Return for a  follow up appointment as needed after appointment with Dr Gwenlyn Found  Check your blood pressure at home daily (if able) and keep record of the readings.  Take your BP meds as follows: *CONTINUE ENTRESTO 24/26MG  TWICE DAILY* *CONTINUE ALL OTHER MEDICATION AS PREVIOUSLY PRESCRIBED*  Bring all of your meds, your BP cuff and your record of home blood pressures to your next appointment.  Exercise as you're able, try to walk approximately 30 minutes per day.  Keep salt intake to a minimum, especially watch canned and prepared boxed foods.  Eat more fresh fruits and vegetables and fewer canned items.  Avoid eating in fast food restaurants.    HOW TO TAKE YOUR BLOOD PRESSURE: . Rest 5 minutes before taking your blood pressure. .  Don't smoke or drink caffeinated beverages for at least 30 minutes before. . Take your blood pressure before (not after) you eat. . Sit comfortably with your back supported and both feet on the floor (don't cross your legs). . Elevate your arm to heart level on a table or a desk. . Use the proper sized cuff. It should fit smoothly and snugly around your bare upper arm. There should be enough room to slip a fingertip under the cuff. The bottom edge of the cuff should be 1 inch above the crease of the elbow. . Ideally, take 3 measurements at one sitting and record the average.

## 2018-05-16 NOTE — Telephone Encounter (Signed)
Patient assistance form for [name] completed and signed by Dr.Berry. Form faxed to [employer] program fax # [phone]

## 2018-05-18 ENCOUNTER — Encounter: Payer: Self-pay | Admitting: Pharmacist

## 2018-05-18 NOTE — Assessment & Plan Note (Signed)
Blood pressure and renal function remain appropriate for Entresto 24/26mg  twice daily but patient still experiencing symptoms of tiredness and shortness of breath. She is not monitoring her BP or weight at home but has scale and BP monitor at home. Due to unstable symptoms today and stable BP , I will continue current therapy without changes and follow up in 2 weeks as previously scheduled. She is monitor BP and weight daily at home and bring records to next office visit. Plan to initiate spironolactone 21.5mg  daily during next office visit if vital remains stable and no additional need for diuresis noted.

## 2018-05-21 ENCOUNTER — Telehealth: Payer: Self-pay

## 2018-05-21 DIAGNOSIS — I493 Ventricular premature depolarization: Secondary | ICD-10-CM

## 2018-05-21 NOTE — Telephone Encounter (Signed)
-----   Message from Pixie Casino, MD sent at 05/14/2018 10:54 AM EST ----- Regarding: RE: Abnormal holter monitor results Monitor shows high burden of afib - Dr. Gwenlyn Found will "Formally" read the study when he returns. I would recommend referral to EP ASAP to evaluate options for AAD therapy or ablation of PVC's.  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  Dr. Lemmie Evens  ----- Message ----- From: Markus Daft A Sent: 05/10/2018   2:03 PM EST To: Pixie Casino, MD, Lorretta Harp, MD Subject: Abnormal holter monitor results                Abnormal holter monitor results assigned to Dr. Debara Pickett to review in your absence.   PVC burden 48.04%, 10 ventricular runs, longest run 4 beats.    Please go to " [employer] Studies,  Assigned to me,  Reading Work List ",  to review patients monitor and sign the study.   Thank you, Darrick Penna

## 2018-05-21 NOTE — Telephone Encounter (Signed)
Pt made aware of results and need for referral to EP. EP orders placed and staff message sent to their scheduler, pt aware they will be in contact to get her scheduled soon. Pt stated she can do any day but 06/04/18 as she already has appt she have moved twice before. No additional questions at this time.

## 2018-05-23 ENCOUNTER — Telehealth: Payer: Self-pay | Admitting: Cardiovascular Disease

## 2018-05-23 NOTE — Telephone Encounter (Signed)
REPRESENTATIVE CALLING TO SEE IF PRIOR AUTH HAS BEEN COMPLETED.  REPRESENSTAIVE GAVE KEY # [id number]- THIS KEY IS NOT VALID   KEY # [id number] -- REVIEWED  AND HAS BEEN APPROVED  05/21/2018 TO 07/04/2019  PA# [id number].  INFORMATION GIVEN TO JESSICA AT [place] WILL START THERE PROCESS AND CONTACT PATIENT'S  INSURANCE [employer]

## 2018-05-23 NOTE — Telephone Encounter (Signed)
New Message:      Janett Billow from [facility] is calling for pre-authorization for this pt

## 2018-05-24 NOTE — Telephone Encounter (Signed)
Patient assistance for entresto approved.

## 2018-05-25 NOTE — Telephone Encounter (Signed)
PA for entresto approved

## 2018-05-29 ENCOUNTER — Encounter: Payer: Self-pay | Admitting: Cardiovascular Disease

## 2018-05-29 ENCOUNTER — Ambulatory Visit: Payer: Medicare HMO | Admitting: Cardiovascular Disease

## 2018-05-29 VITALS — BP 98/66 | HR 77 | Ht 65.0 in | Wt 207.8 lb

## 2018-05-29 DIAGNOSIS — I10 Essential (primary) hypertension: Secondary | ICD-10-CM

## 2018-05-29 DIAGNOSIS — I493 Ventricular premature depolarization: Secondary | ICD-10-CM

## 2018-05-29 DIAGNOSIS — I428 Other cardiomyopathies: Secondary | ICD-10-CM

## 2018-05-29 DIAGNOSIS — E7800 Pure hypercholesterolemia, unspecified: Secondary | ICD-10-CM

## 2018-05-29 DIAGNOSIS — I519 Heart disease, unspecified: Secondary | ICD-10-CM | POA: Diagnosis not present

## 2018-05-29 MED ORDER — SACUBITRIL-VALSARTAN 24-26 MG PO TABS: 1.0000 | ORAL_TABLET | Freq: Two times a day (BID) | ORAL | 11 refills | Status: DC

## 2018-05-29 MED ORDER — FUROSEMIDE 20 MG PO TABS: 20.0000 mg | ORAL_TABLET | Freq: Every day | ORAL | 2 refills | Status: DC

## 2018-05-29 NOTE — Assessment & Plan Note (Signed)
History of severe LV dysfunction with an EF in the 30% range and recent cardiac catheterization performed 04/30/2018 by myself via the right radial approach revealing an EF in the 35 to 40% range with no evidence of CAD.  She has been placed on Entresto and is on low-dose beta-blockade.

## 2018-05-29 NOTE — Assessment & Plan Note (Signed)
History of hyperlipidemia on statin therapy. 

## 2018-05-29 NOTE — Patient Instructions (Signed)
Medication Instructions:  NONE If you need a refill on your cardiac medications before your next appointment, please call your pharmacy.   Lab work: NONE If you have labs (blood work) drawn today and your tests are completely normal, you will receive your results only by: [name] MyChart Message (if you have MyChart) OR . A paper copy in the mail If you have any lab test that is abnormal or we need to change your treatment, we will call you to review the results.  Testing/Procedures: NONE  Follow-Up: At [facility], you and your health needs are our priority.  As part of our continuing mission to provide you with exceptional heart care, we have created designated Provider Care Teams.  These Care Teams include your primary Cardiologist (physician) and Advanced Practice Providers (APPs -  Physician Assistants and Nurse Practitioners) who all work together to provide you with the care you need, when you need it. You will need a follow up appointment in 3 MONTHS WITH A APP AND 6 MONTHS WITH DR. Gwenlyn Found. PLEASE CALL 2 MONTHS IN ADVANCE TO SCHEDULE YOUR APPOINTMENTS.  REFERRAL TO [facility] IN 2 WEEKS

## 2018-05-29 NOTE — Assessment & Plan Note (Signed)
History of essential hypertension blood pressure measured today at 98/66.  She is on Entresto and metoprolol.  Her blood pressure is relatively low compared to her usual readings but she does feel somewhat weak and dizzy however.

## 2018-05-29 NOTE — Progress Notes (Signed)
05/29/2018 [name]   10-26-1950  [id number]  Primary Physician Secundino Ginger, PA-C Primary Cardiologist: Lorretta Harp MD FACP, [place], [place], [place]  HPI:  [name] is a 66 y.o.  severely overweight married Caucasian female mother of 3 children (1 deceased) 02-08-23 last saw in the office  04/25/2018. Unfortunately, her husband of 40 years passed away in 10/09/15 she is still grieving. Shewasalso taking care of her elderly motherhas since passed away 06/03/23. Her sister also moved in with her. I last saw her in the office 03/17/2017.[name] Her primary care provider is Roque Cash. She has a history of hypertension, hyperlipidemia, and diabetes. She has never had a heart attack or stroke. She does have sleep apnea. She has had 2 normal cardiac catheterizations in October 09, 2007 and 14 respectively. She does get occasional atypical chest pain.Since I saw her year ago she's remained stable with occasional episodes of chest pain Since I saw her a year ago she is remained stable. She has episodic atypical chest pain as well. She is also recently complained of palpitations which I suspect her PVCs. She is under a lot of stress and she has high anxiety. She had a 2D echo performed 04/23/2018 revealing a significant decline in her LVEF to 20 to 25% compared to her previous echo performed 10/21/2014 which was essentially normal.  She does complain of dyspnea on exertion and atypical chest pain.  I performed cardiac catheterization on her 04/30/2018 revealing essentially normal coronary arteries with moderately severe LV dysfunction and EF of 35 to 40%.  She is currently on low-dose Entresto and low-dose beta-blocker.  Current Meds  Medication Sig  . ALPRAZolam (XANAX) 1 MG tablet Take 0.5-1 mg by mouth daily as needed for anxiety.   [name] aspirin 81 MG tablet Take 81 mg by mouth daily.   [name] atorvastatin (LIPITOR) 40 MG tablet Take 1 tablet (40 mg total) by mouth daily at 6 PM.  . Biotin 1 MG CAPS  Take 1 capsule by mouth daily.  . busPIRone (BUSPAR) 10 MG tablet Take 10 mg by mouth 2 (two) times daily as needed (anxiety).   . cholecalciferol (VITAMIN D) 1000 UNITS tablet Take 1,000 Units by mouth daily.   . diclofenac (VOLTAREN) 75 MG EC tablet Take 75 mg by mouth 2 (two) times daily.  . furosemide (LASIX) 20 MG tablet Take 1 tablet (20 mg total) by mouth daily.  [name] HYDROcodone-acetaminophen (NORCO) 10-325 MG per tablet Take 0.5-1 tablets by mouth every 6 (six) hours as needed for moderate pain or severe pain.   . metFORMIN (GLUCOPHAGE) 500 MG tablet Take 500 mg by mouth See admin instructions. PT only takes with a big meal  . metoprolol succinate (TOPROL XL) 25 MG 24 hr tablet Take 0.5 tablets (12.5 mg total) by mouth daily.  [name] NITROSTAT 0.4 MG SL tablet Place 1 tablet under the tongue every 5 (five) minutes as needed for chest pain.   [name] omeprazole (PRILOSEC) 20 MG capsule TAKE ONE CAPSULE BY MOUTH TWICE A DAY (Patient taking differently: Take 20 mg by mouth 2 (two) times daily before a meal. )  . sacubitril-valsartan (ENTRESTO) 24-26 MG Take 1 tablet by mouth 2 (two) times daily.  [name] tiZANidine (ZANAFLEX) 4 MG capsule Take 4-8 mg by mouth daily as needed for muscle spasms. For muscle pain  . [DISCONTINUED] furosemide (LASIX) 20 MG tablet Take 1 tablet (20 mg total) by mouth daily.  . [DISCONTINUED] sacubitril-valsartan (ENTRESTO) 24-26 MG  Take 1 tablet by mouth 2 (two) times daily.     Allergies  Allergen Reactions  . Penicillins Anaphylaxis and Swelling    Has patient had a PCN reaction causing immediate rash, facial/tongue/throat swelling, SOB or lightheadedness with hypotension: Yes Has patient had a PCN reaction causing severe rash involving mucus membranes or skin necrosis: No Has patient had a PCN reaction that required hospitalization: Yes Has patient had a PCN reaction occurring within the last 10 years: No    . Shellfish-Derived Products Anaphylaxis and Swelling  . Sulfa  Antibiotics Anaphylaxis and Swelling  . Morphine And Related Nausea And Vomiting    Social History   Socioeconomic History  . Marital status: Widowed    Spouse name: Not on file  . Number of children: 3  . Years of education: Not on file  . Highest education level: Not on file  Occupational History  . Occupation: Disabled  Social Needs  . Financial resource strain: Not on file  . Food insecurity:    Worry: Not on file    Inability: Not on file  . Transportation needs:    Medical: Not on file    Non-medical: Not on file  Tobacco Use  . Smoking status: Never Smoker  . Smokeless tobacco: Never Used  Substance and Sexual Activity  . Alcohol use: No  . Drug use: No  . Sexual activity: Yes    Birth control/protection: Surgical  Lifestyle  . Physical activity:    Days per week: Not on file    Minutes per session: Not on file  . Stress: Not on file  Relationships  . Social connections:    Talks on phone: Not on file    Gets together: Not on file    Attends religious service: Not on file    Active member of club or organization: Not on file    Attends meetings of clubs or organizations: Not on file    Relationship status: Not on file  . Intimate partner violence:    Fear of current or ex partner: Not on file    Emotionally abused: Not on file    Physically abused: Not on file    Forced sexual activity: Not on file  Other Topics Concern  . Not on file  Social History Narrative  . Not on file     Review of Systems: General: negative for chills, fever, night sweats or weight changes.  Cardiovascular: negative for chest pain, dyspnea on exertion, edema, orthopnea, palpitations, paroxysmal nocturnal dyspnea or shortness of breath Dermatological: negative for rash Respiratory: negative for cough or wheezing Urologic: negative for hematuria Abdominal: negative for nausea, vomiting, diarrhea, bright red blood per rectum, melena, or hematemesis Neurologic: negative for  visual changes, syncope, or dizziness All other systems reviewed and are otherwise negative except as noted above.    Blood pressure 98/66, pulse 77, height 5\' 5"  (1.651 m), weight 207 lb 12.8 oz (94.3 kg), SpO2 100 %.  General appearance: alert and no distress Neck: no adenopathy, no carotid bruit, no JVD, supple, symmetrical, trachea midline and thyroid not enlarged, symmetric, no tenderness/mass/nodules Lungs: clear to auscultation bilaterally Heart: regular rate and rhythm, S1, S2 normal, no murmur, click, rub or gallop Extremities: extremities normal, atraumatic, no cyanosis or edema Pulses: 2+ and symmetric Skin: Skin color, texture, turgor normal. No rashes or lesions Neurologic: Alert and oriented X 3, normal strength and tone. Normal symmetric reflexes. Normal coordination and gait  EKG sinus rhythm at 77 with bigeminal  PVCs.  I personally reviewed this EKG.  ASSESSMENT AND PLAN:   HTN (hypertension) History of essential hypertension blood pressure measured today at 98/66.  She is on Entresto and metoprolol.  Her blood pressure is relatively low compared to her usual readings but she does feel somewhat weak and dizzy however.  HLD (hyperlipidemia) History of hyperlipidemia on statin therapy  Left ventricular dysfunction History of severe LV dysfunction with an EF in the 30% range and recent cardiac catheterization performed 04/30/2018 by myself via the right radial approach revealing an EF in the 35 to 40% range with no evidence of CAD.  She has been placed on Entresto and is on low-dose beta-blockade.  PVC's (premature ventricular contractions) Her EKG today shows bigeminal PVCs.  Certainly is possible the part of her cardiomyopathy may be PVC related.      Lorretta Harp MD FACP,FACC,FAHA, [facility] 05/29/2018 2:52 PM

## 2018-05-29 NOTE — Assessment & Plan Note (Signed)
Her EKG today shows bigeminal PVCs.  Certainly is possible the part of her cardiomyopathy may be PVC related.

## 2018-06-01 NOTE — Progress Notes (Signed)
Keep her followup with [name]

## 2018-06-04 ENCOUNTER — Encounter: Payer: Self-pay | Admitting: Internal Medicine

## 2018-06-05 ENCOUNTER — Telehealth (HOSPITAL_COMMUNITY): Payer: Self-pay | Admitting: Cardiology

## 2018-06-05 NOTE — Telephone Encounter (Signed)
Left message for patient to call back, need to give New CHF appt info.

## 2018-06-06 NOTE — Telephone Encounter (Signed)
Patient returned my call.  She is aware of appt 08/02/2018 with Dr. Aundra Dubin.  New pt packet has been mailed to pt.

## 2018-06-11 ENCOUNTER — Encounter: Payer: Self-pay | Admitting: Internal Medicine

## 2018-06-11 ENCOUNTER — Ambulatory Visit: Payer: Medicare HMO | Admitting: Internal Medicine

## 2018-06-11 VITALS — BP 122/74 | HR 78 | Ht 65.0 in | Wt 205.4 lb

## 2018-06-11 DIAGNOSIS — I428 Other cardiomyopathies: Secondary | ICD-10-CM

## 2018-06-11 DIAGNOSIS — I10 Essential (primary) hypertension: Secondary | ICD-10-CM | POA: Diagnosis not present

## 2018-06-11 DIAGNOSIS — I493 Ventricular premature depolarization: Secondary | ICD-10-CM | POA: Diagnosis not present

## 2018-06-11 DIAGNOSIS — I519 Heart disease, unspecified: Secondary | ICD-10-CM | POA: Diagnosis not present

## 2018-06-11 NOTE — Progress Notes (Signed)
Electrophysiology Office Note   Date:  06/11/2018   ID:  [name], DOB [birth year], MRN [id number]  PCP:  Secundino Ginger, PA-C  Cardiologist:  Dr Gwenlyn Found Primary Electrophysiologist: Thompson Grayer, MD    CC: PVCs   History of Present Illness: [name] is a 67 y.o. female who presents today for electrophysiology evaluation.   She is referred by Dr Debara Pickett for EP consultation regarding PVCs.  She has a h/o HTN, DM, obesity, and OSA.  Prior cath has been normal (2009, 2014).  She has rare atypical chest pains.  Her EF is 20-25%.  She has SOB with moderate activity.  She was recently evaluated by Dr Gwenlyn Found and had repeat cath which revealed normal cors with EF 35-40%.  She has been treated with medical therapy, including beta blockers.  Today, she denies symptoms of palpitations, chest pain, orthopnea, PND, lower extremity edema, claudication, dizziness, presyncope, syncope, bleeding, or neurologic sequela. The patient is tolerating medications without difficulties and is otherwise without complaint today.    Past Medical History:  Diagnosis Date  . Adenomatous colon polyp 2006  . Arrhythmia    Heart  . Arthritis   . Asthma   . Chronic headaches   . Complication of anesthesia   . Diabetes mellitus type II, controlled ([place])    Diet  . Gastric polyp    hyperplastic  . GERD (gastroesophageal reflux disease)   . Headache   . Hyperlipidemia   . Hypertension   . IBS (irritable bowel syndrome)   . Kidney stones   . Lymphocytic colitis   . Myocardial infarct ([place])   . Neuropathy, peripheral   . PONV (postoperative nausea and vomiting)   . Sleep apnea    no CPAP  . Ulcer   . Urinary tract infection    Past Surgical History:  Procedure Laterality Date  . ABDOMINAL HYSTERECTOMY    . ABDOMINAL HYSTERECTOMY    . BACK SURGERY    . BLADDER REPAIR     x2  . CARDIAC CATHETERIZATION  04/2008  . CHOLECYSTECTOMY  2000  . LEFT HEART CATHETERIZATION WITH CORONARY ANGIOGRAM N/A  08/08/2012   Procedure: LEFT HEART CATHETERIZATION WITH CORONARY ANGIOGRAM;  Surgeon: Lorretta Harp, MD;  Location[facility] CATH LAB;  Service: Cardiovascular;  Laterality: N/A;  . RIGHT/LEFT HEART CATH AND CORONARY ANGIOGRAPHY N/A 04/30/2018   Procedure: RIGHT/LEFT HEART CATH AND CORONARY ANGIOGRAPHY;  Surgeon: Lorretta Harp, MD;  Location: [facility] CV LAB;  Service: Cardiovascular;  Laterality: N/A;     Current Outpatient Medications  Medication Sig Dispense Refill  . ALPRAZolam (XANAX) 1 MG tablet Take 0.5-1 mg by mouth daily as needed for anxiety.     [name] aspirin 81 MG tablet Take 81 mg by mouth daily.     [name] atorvastatin (LIPITOR) 40 MG tablet Take 1 tablet (40 mg total) by mouth daily at 6 PM. 90 tablet 3  . Biotin 1 MG CAPS Take 1 capsule by mouth daily.    . busPIRone (BUSPAR) 10 MG tablet Take 10 mg by mouth 2 (two) times daily as needed (anxiety).     . cholecalciferol (VITAMIN D) 1000 UNITS tablet Take 1,000 Units by mouth daily.     . diclofenac (VOLTAREN) 75 MG EC tablet Take 75 mg by mouth 2 (two) times daily.    . furosemide (LASIX) 20 MG tablet Take 1 tablet (20 mg total) by mouth daily. 90 tablet 2  . gabapentin (NEURONTIN) 100 MG capsule  Take 200 mg by mouth at bedtime.    [name] HYDROcodone-acetaminophen (NORCO) 10-325 MG per tablet Take 0.5-1 tablets by mouth every 6 (six) hours as needed for moderate pain or severe pain.     . meclizine (ANTIVERT) 25 MG tablet Take 25 mg by mouth 3 (three) times daily as needed for dizziness.    . metFORMIN (GLUCOPHAGE) 500 MG tablet Take 500 mg by mouth See admin instructions. PT only takes with a big meal    . metoprolol succinate (TOPROL XL) 25 MG 24 hr tablet Take 0.5 tablets (12.5 mg total) by mouth daily. 45 tablet 3  . NITROSTAT 0.4 MG SL tablet Place 1 tablet under the tongue every 5 (five) minutes as needed for chest pain.   0  . omeprazole (PRILOSEC) 20 MG capsule TAKE ONE CAPSULE BY MOUTH TWICE A DAY 60 capsule 3  .  sacubitril-valsartan (ENTRESTO) 24-26 MG Take 1 tablet by mouth 2 (two) times daily. 60 tablet 11  . tiZANidine (ZANAFLEX) 4 MG capsule Take 4-8 mg by mouth daily as needed for muscle spasms. For muscle pain     No current facility-administered medications for this visit.     Allergies:   Penicillins; Shellfish-derived products; Sulfa antibiotics; and Morphine and related   Social History:  The patient  reports that she has never smoked. She has never used smokeless tobacco. She reports that she does not drink alcohol or use drugs.   Family History:  The patient's family history includes Colon cancer in her paternal uncle; Hypertension in her mother.    ROS:  Please see the history of present illness.   All other systems are personally reviewed and negative.    PHYSICAL EXAM: VS:  BP 122/74   Pulse 78   Ht 5\' 5"  (1.651 m)   Wt 205 lb 6.4 oz (93.2 kg)   SpO2 98%   BMI 34.18 kg/m  , BMI Body mass index is 34.18 kg/m. GEN: overweight , in no acute distress  HEENT: normal  Neck: no JVD, carotid bruits, or masses Cardiac: RRR with frequent ectopy; no murmurs, rubs, or gallops,no edema  Respiratory:  clear to auscultation bilaterally, normal work of breathing GI: soft, nontender, nondistended, + BS MS: no deformity or atrophy  Skin: warm and dry  Neuro:  Strength and sensation are intact Psych: euthymic mood, full affect  EKG:  EKG is ordered today. The ekg ordered today is personally reviewed and shows sinus rhythm with bigeminal PVCs, relatively closely coupled to the preceeding t wave.  PVC morphology is RBB, LAHB    Recent Labs: 04/25/2018: Hemoglobin 13.1; Platelets 160; TSH 1.180 05/08/2018: BUN 23; Creatinine, Ser 0.98; Potassium 4.0; Sodium 143  personally reviewed   Lipid Panel     Component Value Date/Time   CHOL  04/20/2008 0400    139        ATP III CLASSIFICATION:  <200     mg/dL   Desirable  200-239  mg/dL   Borderline High  >=240    mg/dL   High   TRIG  118 04/20/2008 0400   HDL 37 (L) 04/20/2008 0400   CHOLHDL 3.8 04/20/2008 0400   VLDL 24 04/20/2008 0400   LDLCALC  04/20/2008 0400    78        Total Cholesterol/HDL:CHD Risk Coronary Heart Disease Risk Table                     Men   Women  1/2  Average Risk   3.4   3.3   personally reviewed   Wt Readings from Last 3 Encounters:  06/11/18 205 lb 6.4 oz (93.2 kg)  05/29/18 207 lb 12.8 oz (94.3 kg)  05/15/18 202 lb 6.4 oz (91.8 kg)     Other studies personally reviewed: Additional studies/ records that were reviewed today include: Dr Kennon Holter notes, prior echo, echo 04/23/18 Review of the above records today demonstrates: EF 20%, severe LA enlargement, mild MR  holter 05/04/18 reveals PVC burden of 48%  ASSESSMENT AND PLAN:  1.  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  Her PVC appears to arise from near the posterior papillary muscle of the LV. Therapeutic strategies for PVCs, NSVT including medicine and ablation were discussed in detail with the patient today. Risk, benefits, and alternatives to EP study and radiofrequency ablation were also discussed in detail today. These risks include but are not limited to stroke, bleeding, vascular damage, tamponade, perforation, damage to the heart and other structures, AV block requiring pacemaker, worsening renal function, and death. The patient understands these risk and wishes to proceed.  We will therefore proceed with catheter ablation at the next available time. Carto, ICE, and anesthesia are requested for this procedure.  2. Nonischemic CM I agree with Dr Gwenlyn Found that this is likely secondary to PVCs Hopefully her EF will recover with ablation.  3. HTN Stable No change required today    Current medicines are reviewed at length with the patient today.   The patient does not have concerns regarding her medicines.  The following changes were made today:  none    Signed, Thompson Grayer, MD  06/11/2018 3:58 PM       [address] [place] [address] [phone] (office) [phone] (fax)

## 2018-06-11 NOTE — Patient Instructions (Signed)
Medication Instructions:  Your physician recommends that you continue on your current medications as directed. Please refer to the Current Medication list given to you today.  Labwork: Your physician recommends that you return for lab work:  The week of January 6th: CBC and BMP  Testing/Procedures: Your physician has recommended that you have an ablation. Catheter ablation is a medical procedure used to treat some cardiac arrhythmias (irregular heartbeats). During catheter ablation, a long, thin, flexible tube is put into a blood vessel in your groin (upper thigh), or neck. This tube is called an ablation catheter. It is then guided to your heart through the blood vessel. Radio frequency waves destroy small areas of heart tissue where abnormal heartbeats may cause an arrhythmia to start. Please see the instruction sheet given to you today.   Follow-Up: Your physician recommends that you schedule a follow-up appointment in:   4 weeks after 1/16 for a post ablation follow up with Dr Rayann Heman  Any Other Special Instructions Will Be Listed Below (If Applicable).     If you need a refill on your cardiac medications before your next appointment, please call your pharmacy.

## 2018-06-28 ENCOUNTER — Telehealth: Payer: Self-pay | Admitting: Physician Assistant

## 2018-06-28 NOTE — Telephone Encounter (Signed)
New message     Pt calling in reference to letter that she received about her monitor results . Please call to discuss

## 2018-06-29 NOTE — Telephone Encounter (Signed)
Attempted to contact pt X 2. Was told I have the wrong number.

## 2018-06-29 NOTE — Telephone Encounter (Signed)
Left message to call back on 12/26

## 2018-07-09 ENCOUNTER — Other Ambulatory Visit: Payer: Medicare Other | Admitting: *Deleted

## 2018-07-09 DIAGNOSIS — I493 Ventricular premature depolarization: Secondary | ICD-10-CM

## 2018-07-09 DIAGNOSIS — I10 Essential (primary) hypertension: Secondary | ICD-10-CM

## 2018-07-09 DIAGNOSIS — I519 Heart disease, unspecified: Secondary | ICD-10-CM

## 2018-07-09 DIAGNOSIS — I428 Other cardiomyopathies: Secondary | ICD-10-CM

## 2018-07-10 LAB — BASIC METABOLIC PANEL
BUN/Creatinine Ratio: 25 (ref 12–28)
BUN: 20 mg/dL (ref 8–27)
CO2: 24 mmol/L (ref 20–29)
CREATININE: 0.81 mg/dL (ref 0.57–1.00)
Calcium: 9.3 mg/dL (ref 8.7–10.3)
Chloride: 103 mmol/L (ref 96–106)
GFR calc Af Amer: 87 mL/min/{1.73_m2} (ref >59)
GFR calc non Af Amer: 75 mL/min/{1.73_m2} (ref >59)
Glucose: 92 mg/dL (ref 65–99)
Potassium: 3.6 mmol/L (ref 3.5–5.2)
Sodium: 143 mmol/L (ref 134–144)

## 2018-07-10 LAB — CBC
HEMOGLOBIN: 13 g/dL (ref 11.1–15.9)
Hematocrit: 38 % (ref 34.0–46.6)
MCH: 28.4 pg (ref 26.6–33.0)
MCHC: 34.2 g/dL (ref 31.5–35.7)
MCV: 83 fL (ref 79–97)
Platelets: 154 10*3/uL (ref 150–450)
RBC: 4.58 x10E6/uL (ref 3.77–5.28)
RDW: 13.9 % (ref 11.7–15.4)
WBC: 5.7 10*3/uL (ref 3.4–10.8)

## 2018-07-16 ENCOUNTER — Telehealth: Payer: Self-pay | Admitting: Internal Medicine

## 2018-07-16 NOTE — Telephone Encounter (Signed)
Left message notifying of arrival time to [name] for procedure (8:30 am on July 19, 2018)  Reviewed instructions.   Left nurse name and # for call back if any further questions.

## 2018-07-16 NOTE — Telephone Encounter (Signed)
New Message:     Pt says she is scheduled for an Ablation on 07-19-18. She needs the instructions and also the time she needs to be there.

## 2018-07-18 ENCOUNTER — Telehealth: Payer: Self-pay

## 2018-07-18 NOTE — Telephone Encounter (Signed)
Call placed to Pt.  Pt agreeable to come in first case 07/19/2018 for ablation.  Notified cath lab.

## 2018-07-19 ENCOUNTER — Ambulatory Visit (HOSPITAL_COMMUNITY): Payer: Medicare Other | Admitting: Certified Registered Nurse Anesthetist

## 2018-07-19 ENCOUNTER — Encounter (HOSPITAL_COMMUNITY): Payer: Self-pay | Admitting: Certified Registered Nurse Anesthetist

## 2018-07-19 ENCOUNTER — Encounter (HOSPITAL_COMMUNITY)
Admission: RE | Disposition: A | Discharge status: Home / Self Care | Payer: Self-pay | Source: Home / Self Care | Attending: Internal Medicine

## 2018-07-19 ENCOUNTER — Other Ambulatory Visit: Payer: Self-pay

## 2018-07-19 ENCOUNTER — Ambulatory Visit (HOSPITAL_COMMUNITY)
Admission: RE | Admit: 2018-07-19 | Discharge: 2018-07-20 | Disposition: A | Discharge status: Home / Self Care | Payer: Medicare Other | Attending: Internal Medicine | Admitting: Internal Medicine

## 2018-07-19 DIAGNOSIS — Z7984 Long term (current) use of oral hypoglycemic drugs: Secondary | ICD-10-CM | POA: Insufficient documentation

## 2018-07-19 DIAGNOSIS — K589 Irritable bowel syndrome without diarrhea: Secondary | ICD-10-CM | POA: Insufficient documentation

## 2018-07-19 DIAGNOSIS — E785 Hyperlipidemia, unspecified: Secondary | ICD-10-CM | POA: Diagnosis not present

## 2018-07-19 DIAGNOSIS — Z882 Allergy status to sulfonamides status: Secondary | ICD-10-CM | POA: Diagnosis not present

## 2018-07-19 DIAGNOSIS — Z7982 Long term (current) use of aspirin: Secondary | ICD-10-CM | POA: Insufficient documentation

## 2018-07-19 DIAGNOSIS — I493 Ventricular premature depolarization: Secondary | ICD-10-CM | POA: Diagnosis not present

## 2018-07-19 DIAGNOSIS — Z79899 Other long term (current) drug therapy: Secondary | ICD-10-CM | POA: Insufficient documentation

## 2018-07-19 DIAGNOSIS — E669 Obesity, unspecified: Secondary | ICD-10-CM | POA: Diagnosis not present

## 2018-07-19 DIAGNOSIS — G4733 Obstructive sleep apnea (adult) (pediatric): Secondary | ICD-10-CM | POA: Insufficient documentation

## 2018-07-19 DIAGNOSIS — M1990 Unspecified osteoarthritis, unspecified site: Secondary | ICD-10-CM | POA: Diagnosis not present

## 2018-07-19 DIAGNOSIS — Z885 Allergy status to narcotic agent status: Secondary | ICD-10-CM | POA: Diagnosis not present

## 2018-07-19 DIAGNOSIS — Z8249 Family history of ischemic heart disease and other diseases of the circulatory system: Secondary | ICD-10-CM | POA: Diagnosis not present

## 2018-07-19 DIAGNOSIS — I10 Essential (primary) hypertension: Secondary | ICD-10-CM | POA: Insufficient documentation

## 2018-07-19 DIAGNOSIS — K219 Gastro-esophageal reflux disease without esophagitis: Secondary | ICD-10-CM | POA: Insufficient documentation

## 2018-07-19 DIAGNOSIS — Z6834 Body mass index (BMI) 34.0-34.9, adult: Secondary | ICD-10-CM | POA: Diagnosis not present

## 2018-07-19 DIAGNOSIS — I252 Old myocardial infarction: Secondary | ICD-10-CM | POA: Insufficient documentation

## 2018-07-19 DIAGNOSIS — E1142 Type 2 diabetes mellitus with diabetic polyneuropathy: Secondary | ICD-10-CM | POA: Diagnosis not present

## 2018-07-19 DIAGNOSIS — Z880 Allergy status to penicillin: Secondary | ICD-10-CM | POA: Insufficient documentation

## 2018-07-19 DIAGNOSIS — Z90710 Acquired absence of both cervix and uterus: Secondary | ICD-10-CM | POA: Diagnosis not present

## 2018-07-19 DIAGNOSIS — Z955 Presence of coronary angioplasty implant and graft: Secondary | ICD-10-CM | POA: Diagnosis not present

## 2018-07-19 HISTORY — PX: V TACH ABLATION: EP1227

## 2018-07-19 LAB — GLUCOSE, CAPILLARY
Glucose-Capillary: 107 mg/dL — ABNORMAL HIGH (ref 70–99)
Glucose-Capillary: 118 mg/dL — ABNORMAL HIGH (ref 70–99)
Glucose-Capillary: 156 mg/dL — ABNORMAL HIGH (ref 70–99)
Glucose-Capillary: 170 mg/dL — ABNORMAL HIGH (ref 70–99)

## 2018-07-19 LAB — POCT ACTIVATED CLOTTING TIME: Activated Clotting Time: 175 seconds

## 2018-07-19 SURGERY — V TACH ABLATION
Anesthesia: Monitor Anesthesia Care

## 2018-07-19 MED ORDER — ONDANSETRON HCL 4 MG/2ML IJ SOLN
INTRAMUSCULAR | Status: DC | PRN
  Administered 2018-07-19: 4 mg via INTRAVENOUS

## 2018-07-19 MED ORDER — HEPARIN (PORCINE) IN NACL 1000-0.9 UT/500ML-% IV SOLN
INTRAVENOUS | Status: DC | PRN
  Administered 2018-07-19: 500 mL

## 2018-07-19 MED ORDER — BUPIVACAINE HCL (PF) 0.25 % IJ SOLN
INTRAMUSCULAR | Status: DC | PRN
  Administered 2018-07-19: 30 mL

## 2018-07-19 MED ORDER — HEPARIN SODIUM (PORCINE) 1000 UNIT/ML IJ SOLN
INTRAMUSCULAR | Status: DC | PRN
  Administered 2018-07-19: 12000 [IU] via INTRAVENOUS
  Administered 2018-07-19: 1000 [IU] via INTRAVENOUS

## 2018-07-19 MED ORDER — INSULIN ASPART 100 UNIT/ML ~~LOC~~ SOLN: 0.0000 [IU] | Freq: Three times a day (TID) | SUBCUTANEOUS | Status: DC

## 2018-07-19 MED ORDER — SACUBITRIL-VALSARTAN 24-26 MG PO TABS
1.0000 | ORAL_TABLET | Freq: Two times a day (BID) | ORAL | Status: DC
  Administered 2018-07-19 – 2018-07-20 (×2): 1 via ORAL
  Filled 2018-07-19: qty 1

## 2018-07-19 MED ORDER — PROTAMINE SULFATE 10 MG/ML IV SOLN
INTRAVENOUS | Status: DC | PRN
  Administered 2018-07-19: 30 mg via INTRAVENOUS

## 2018-07-19 MED ORDER — OFLOXACIN 0.3 % OP SOLN
1.0000 [drp] | Freq: Four times a day (QID) | OPHTHALMIC | Status: DC
  Administered 2018-07-19 – 2018-07-20 (×3): 1 [drp] via OPHTHALMIC
  Filled 2018-07-19: qty 5

## 2018-07-19 MED ORDER — HEPARIN SODIUM (PORCINE) 1000 UNIT/ML IJ SOLN
INTRAMUSCULAR | Status: DC | PRN
  Administered 2018-07-19: 5000 [IU] via INTRAVENOUS

## 2018-07-19 MED ORDER — SODIUM CHLORIDE 0.9 % IV SOLN
INTRAVENOUS | Status: DC
  Administered 2018-07-19: 07:00:00 via INTRAVENOUS

## 2018-07-19 MED ORDER — ALPRAZOLAM 0.5 MG PO TABS: 0.5000 mg | ORAL_TABLET | Freq: Every day | ORAL | Status: DC | PRN

## 2018-07-19 MED ORDER — SODIUM CHLORIDE 0.9% FLUSH
3.0000 mL | Freq: Two times a day (BID) | INTRAVENOUS | Status: DC
  Administered 2018-07-19 – 2018-07-20 (×3): 3 mL via INTRAVENOUS

## 2018-07-19 MED ORDER — ACETAMINOPHEN 325 MG PO TABS: 650.0000 mg | ORAL_TABLET | ORAL | Status: DC | PRN

## 2018-07-19 MED ORDER — ONDANSETRON HCL 4 MG/2ML IJ SOLN: 4.0000 mg | Freq: Four times a day (QID) | INTRAMUSCULAR | Status: DC | PRN

## 2018-07-19 MED ORDER — SODIUM CHLORIDE 0.9 % IV SOLN: 250.0000 mL | INTRAVENOUS | Status: DC | PRN

## 2018-07-19 MED ORDER — ISOPROTERENOL HCL 0.2 MG/ML IJ SOLN
INTRAVENOUS | Status: DC | PRN
  Administered 2018-07-19: 6 ug/min via INTRAVENOUS

## 2018-07-19 MED ORDER — PROPOFOL 10 MG/ML IV BOLUS
INTRAVENOUS | Status: DC | PRN
  Administered 2018-07-19: 30 mg via INTRAVENOUS
  Administered 2018-07-19: 20 mg via INTRAVENOUS

## 2018-07-19 MED ORDER — HEPARIN SODIUM (PORCINE) 1000 UNIT/ML IJ SOLN
INTRAMUSCULAR | Status: AC
  Filled 2018-07-19: qty 1

## 2018-07-19 MED ORDER — INSULIN ASPART 100 UNIT/ML ~~LOC~~ SOLN: 0.0000 [IU] | Freq: Every day | SUBCUTANEOUS | Status: DC

## 2018-07-19 MED ORDER — SODIUM CHLORIDE 0.9% FLUSH: 3.0000 mL | INTRAVENOUS | Status: DC | PRN

## 2018-07-19 MED ORDER — PROPOFOL 500 MG/50ML IV EMUL
INTRAVENOUS | Status: DC | PRN
  Administered 2018-07-19: 75 ug/kg/min via INTRAVENOUS

## 2018-07-19 MED ORDER — BUSPIRONE HCL 5 MG PO TABS
10.0000 mg | ORAL_TABLET | Freq: Two times a day (BID) | ORAL | Status: DC
  Administered 2018-07-19 – 2018-07-20 (×2): 10 mg via ORAL
  Filled 2018-07-19 (×2): qty 2

## 2018-07-19 MED ORDER — MIDAZOLAM HCL 2 MG/2ML IJ SOLN
INTRAMUSCULAR | Status: DC | PRN
  Administered 2018-07-19: 2 mg via INTRAVENOUS

## 2018-07-19 MED ORDER — HYDROCODONE-ACETAMINOPHEN 10-325 MG PO TABS: 0.5000 | ORAL_TABLET | Freq: Four times a day (QID) | ORAL | Status: DC | PRN

## 2018-07-19 MED ORDER — PHENYLEPHRINE 40 MCG/ML (10ML) SYRINGE FOR IV PUSH (FOR BLOOD PRESSURE SUPPORT)
PREFILLED_SYRINGE | INTRAVENOUS | Status: DC | PRN
  Administered 2018-07-19 (×4): 80 ug via INTRAVENOUS

## 2018-07-19 MED ORDER — ISOPROTERENOL HCL 0.2 MG/ML IJ SOLN
INTRAMUSCULAR | Status: AC
  Filled 2018-07-19: qty 5

## 2018-07-19 MED ORDER — BUPIVACAINE HCL (PF) 0.25 % IJ SOLN
INTRAMUSCULAR | Status: AC
  Filled 2018-07-19: qty 30

## 2018-07-19 MED ORDER — GABAPENTIN 100 MG PO CAPS: 200.0000 mg | ORAL_CAPSULE | Freq: Every day | ORAL | Status: DC

## 2018-07-19 MED ORDER — ASPIRIN EC 81 MG PO TBEC
81.0000 mg | DELAYED_RELEASE_TABLET | Freq: Every day | ORAL | Status: DC
  Administered 2018-07-19 – 2018-07-20 (×2): 81 mg via ORAL
  Filled 2018-07-19 (×2): qty 1

## 2018-07-19 MED ORDER — SODIUM CHLORIDE 0.9 % IV SOLN
INTRAVENOUS | Status: DC | PRN
  Administered 2018-07-19: 25 ug/min via INTRAVENOUS

## 2018-07-19 MED ORDER — DEXAMETHASONE SODIUM PHOSPHATE 10 MG/ML IJ SOLN
INTRAMUSCULAR | Status: DC | PRN
  Administered 2018-07-19: 5 mg via INTRAVENOUS

## 2018-07-19 MED ORDER — FENTANYL CITRATE (PF) 250 MCG/5ML IJ SOLN
INTRAMUSCULAR | Status: DC | PRN
  Administered 2018-07-19 (×2): 25 ug via INTRAVENOUS

## 2018-07-19 MED ORDER — METOPROLOL SUCCINATE ER 25 MG PO TB24
12.5000 mg | ORAL_TABLET | Freq: Every day | ORAL | Status: DC
  Administered 2018-07-19 – 2018-07-20 (×2): 12.5 mg via ORAL
  Filled 2018-07-19: qty 1

## 2018-07-19 MED ORDER — FUROSEMIDE 20 MG PO TABS
20.0000 mg | ORAL_TABLET | Freq: Every day | ORAL | Status: DC
  Administered 2018-07-19 – 2018-07-20 (×2): 20 mg via ORAL
  Filled 2018-07-19: qty 1

## 2018-07-19 SURGICAL SUPPLY — 16 items
BLANKET WARM UNDERBOD FULL ACC (MISCELLANEOUS) ×2 IMPLANT
CATH NAVISTAR SMARTTOUCH DF (ABLATOR) ×2 IMPLANT
CATH SOUNDSTAR ECO 8FR (CATHETERS) ×2 IMPLANT
CATH WEBSTER BI DIR CS D-F CRV (CATHETERS) ×2 IMPLANT
COVER SWIFTLINK CONNECTOR (BAG) ×2 IMPLANT
PACK EP LATEX FREE (CUSTOM PROCEDURE TRAY) ×3
PACK EP LF (CUSTOM PROCEDURE TRAY) ×1 IMPLANT
PAD PRO RADIOLUCENT 2001M-C (PAD) ×3 IMPLANT
PATCH CARTO3 (PAD) ×2 IMPLANT
SHEATH AGILIS NXT 8.5F 71CM (SHEATH) ×2 IMPLANT
SHEATH AVANTI 11F 11CM (SHEATH) ×2 IMPLANT
SHEATH BAYLIS TRANSSEPTAL 98CM (NEEDLE) ×2 IMPLANT
SHEATH PINNACLE 7F 10CM (SHEATH) ×4 IMPLANT
SHEATH PINNACLE 8F 10CM (SHEATH) ×2 IMPLANT
SHEATH PINNACLE 9F 10CM (SHEATH) IMPLANT
TUBING SMART ABLATE COOLFLOW (TUBING) ×2 IMPLANT

## 2018-07-19 NOTE — Anesthesia Preprocedure Evaluation (Signed)
Anesthesia Evaluation  Patient identified by MRN, date of birth, ID band Patient awake    Reviewed: Allergy & Precautions, NPO status , Patient's Chart, lab work & pertinent test results  Airway Mallampati: II  TM Distance: >3 FB Neck ROM: Full    Dental  (+) Edentulous Upper, Dental Advisory Given   Pulmonary    breath sounds clear to auscultation       Cardiovascular hypertension,  Rhythm:Regular Rate:Normal     Neuro/Psych    GI/Hepatic   Endo/Other  diabetes  Renal/GU      Musculoskeletal   Abdominal   Peds  Hematology   Anesthesia Other Findings   Reproductive/Obstetrics                             Anesthesia Physical Anesthesia Plan  ASA: III  Anesthesia Plan: MAC   Post-op Pain Management:    Induction: Intravenous  PONV Risk Score and Plan: Ondansetron and Dexamethasone  Airway Management Planned: Simple Face Mask and Natural Airway  Additional Equipment:   Intra-op Plan:   Post-operative Plan:   Informed Consent: I have reviewed the patients History and Physical, chart, labs and discussed the procedure including the risks, benefits and alternatives for the proposed anesthesia with the patient or authorized representative who has indicated his/her understanding and acceptance.     Dental advisory given  Plan Discussed with: CRNA and Anesthesiologist  Anesthesia Plan Comments:         Anesthesia Quick Evaluation

## 2018-07-19 NOTE — Progress Notes (Signed)
Site area: 3 rt fv sheaths Site Prior to Removal:  Level 0 Pressure Applied For: 20 minutes Manual:   yes Patient Status During Pull:  stable Post Pull Site:  Level  0 Post Pull Instructions Given:  yes Post Pull Pulses Present: rt dp palpable Dressing Applied:  Gauze and tegaderm Bedrest begins @ 1050 Comments:  IV saline l0cked

## 2018-07-19 NOTE — Anesthesia Procedure Notes (Signed)
Procedure Name: MAC Date/Time: 07/19/2018 7:23 AM Performed by: Alain Marion, CRNA Oxygen Delivery Method: Simple face mask Placement Confirmation: positive ETCO2

## 2018-07-19 NOTE — H&P (Signed)
PCP:  Secundino Ginger, PA-C           Cardiologist:  Dr Gwenlyn Found Primary Electrophysiologist: Thompson Grayer, MD       CC: PVCs   History of Present Illness: [name] is a 68 y.o. female who presents today for electrophysiology study and ablation for PVCs.   She has a h/o HTN, DM, obesity, and OSA.  Prior cath has been normal (2009, 2014).  She has rare atypical chest pains.  Her EF is 20-25%.  She has SOB with moderate activity.  She was recently evaluated by Dr Gwenlyn Found and had repeat cath which revealed normal cors with EF 35-40%.  She has been treated with medical therapy, including beta blockers.  Today, she denies symptoms of palpitations, chest pain, orthopnea, PND, lower extremity edema, claudication, dizziness, presyncope, syncope, bleeding, or neurologic sequela. The patient is tolerating medications without difficulties and is otherwise without complaint today.        Past Medical History:  Diagnosis Date  . Adenomatous colon polyp 2006  . Arrhythmia    Heart  . Arthritis   . Asthma   . Chronic headaches   . Complication of anesthesia   . Diabetes mellitus type II, controlled ([place])    Diet  . Gastric polyp    hyperplastic  . GERD (gastroesophageal reflux disease)   . Headache   . Hyperlipidemia   . Hypertension   . IBS (irritable bowel syndrome)   . Kidney stones   . Lymphocytic colitis   . Myocardial infarct ([place])   . Neuropathy, peripheral   . PONV (postoperative nausea and vomiting)   . Sleep apnea    no CPAP  . Ulcer   . Urinary tract infection         Past Surgical History:  Procedure Laterality Date  . ABDOMINAL HYSTERECTOMY    . ABDOMINAL HYSTERECTOMY    . BACK SURGERY    . BLADDER REPAIR     x2  . CARDIAC CATHETERIZATION  04/2008  . CHOLECYSTECTOMY  2000  . LEFT HEART CATHETERIZATION WITH CORONARY ANGIOGRAM N/A 08/08/2012   Procedure: LEFT HEART CATHETERIZATION WITH CORONARY ANGIOGRAM;  Surgeon: Lorretta Harp, MD;  Location: [facility] CATH LAB;  Service: Cardiovascular;  Laterality: N/A;  . RIGHT/LEFT HEART CATH AND CORONARY ANGIOGRAPHY N/A 04/30/2018   Procedure: RIGHT/LEFT HEART CATH AND CORONARY ANGIOGRAPHY;  Surgeon: Lorretta Harp, MD;  Location: [place] CV LAB;  Service: Cardiovascular;  Laterality: N/A;           Current Outpatient Medications  Medication Sig Dispense Refill  . ALPRAZolam (XANAX) 1 MG tablet Take 0.5-1 mg by mouth daily as needed for anxiety.     [name] aspirin 81 MG tablet Take 81 mg by mouth daily.     [name] atorvastatin (LIPITOR) 40 MG tablet Take 1 tablet (40 mg total) by mouth daily at 6 PM. 90 tablet 3  . Biotin 1 MG CAPS Take 1 capsule by mouth daily.    . busPIRone (BUSPAR) 10 MG tablet Take 10 mg by mouth 2 (two) times daily as needed (anxiety).     . cholecalciferol (VITAMIN D) 1000 UNITS tablet Take 1,000 Units by mouth daily.     . diclofenac (VOLTAREN) 75 MG EC tablet Take 75 mg by mouth 2 (two) times daily.    . furosemide (LASIX) 20 MG tablet Take 1 tablet (20 mg total) by mouth daily. 90 tablet 2  . gabapentin (NEURONTIN) 100 MG capsule Take 200  mg by mouth at bedtime.    [name] HYDROcodone-acetaminophen (NORCO) 10-325 MG per tablet Take 0.5-1 tablets by mouth every 6 (six) hours as needed for moderate pain or severe pain.     . meclizine (ANTIVERT) 25 MG tablet Take 25 mg by mouth 3 (three) times daily as needed for dizziness.    . metFORMIN (GLUCOPHAGE) 500 MG tablet Take 500 mg by mouth See admin instructions. PT only takes with a big meal    . metoprolol succinate (TOPROL XL) 25 MG 24 hr tablet Take 0.5 tablets (12.5 mg total) by mouth daily. 45 tablet 3  . NITROSTAT 0.4 MG SL tablet Place 1 tablet under the tongue every 5 (five) minutes as needed for chest pain.   0  . omeprazole (PRILOSEC) 20 MG capsule TAKE ONE CAPSULE BY MOUTH TWICE A DAY 60 capsule 3  . sacubitril-valsartan (ENTRESTO) 24-26 MG Take 1 tablet by mouth 2 (two) times  daily. 60 tablet 11  . tiZANidine (ZANAFLEX) 4 MG capsule Take 4-8 mg by mouth daily as needed for muscle spasms. For muscle pain     No current facility-administered medications for this visit.     Allergies:   Penicillins; Shellfish-derived products; Sulfa antibiotics; and Morphine and related   Social History:  The patient  reports that she has never smoked. She has never used smokeless tobacco. She reports that she does not drink alcohol or use drugs.   Family History:  The patient's family history includes Colon cancer in her paternal uncle; Hypertension in her mother.    ROS:  Please see the history of present illness.   All other systems are personally reviewed and negative.    PHYSICAL EXAM: Vitals:   07/19/18 0556  BP: (!) 150/67  Pulse: (!) 39  Resp: 18  Temp: (!) 97.5 F (36.4 C)  SpO2: 100%   GEN: overweight , in no acute distress  HEENT: normal  Neck: no JVD, carotid bruits, or masses Cardiac: RRR with frequent ectopy; no murmurs, rubs, or gallops,no edema  Respiratory:  clear to auscultation bilaterally, normal work of breathing GI: soft, nontender, nondistended, + BS MS: no deformity or atrophy  Skin: warm and dry  Neuro:  Strength and sensation are intact Psych: euthymic mood, full affect   Recent Labs: 04/25/2018: Hemoglobin 13.1; Platelets 160; TSH 1.180 05/08/2018: BUN 23; Creatinine, Ser 0.98; Potassium 4.0; Sodium 143  personally reviewed   Lipid Panel  Labs(Brief)           Component Value Date/Time   CHOL  04/20/2008 0400    139        ATP III CLASSIFICATION:  <200     mg/dL   Desirable  200-239  mg/dL   Borderline High  >=240    mg/dL   High   TRIG 118 04/20/2008 0400   HDL 37 (L) 04/20/2008 0400   CHOLHDL 3.8 04/20/2008 0400   VLDL 24 04/20/2008 0400   LDLCALC  04/20/2008 0400    78        Total Cholesterol/HDL:CHD Risk Coronary Heart Disease Risk Table                     Men   Women  1/2 Average Risk    3.4   3.3     personally reviewed      Wt Readings from Last 3 Encounters:  06/11/18 205 lb 6.4 oz (93.2 kg)  05/29/18 207 lb 12.8 oz (94.3 kg)  05/15/18 202  lb 6.4 oz (91.8 kg)     Other studies personally reviewed: Additional studies/ records that were reviewed today include: Dr Kennon Holter notes, prior echo, echo 04/23/18 Review of the above records today demonstrates: EF 20%, severe LA enlargement, mild MR  holter 05/04/18 reveals PVC burden of 48%  ASSESSMENT AND PLAN:  1.  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  Her PVC appears to arise from near the posterior papillary muscle of the LV. Therapeutic strategies for PVCs and ventricular tachycardia including medicine and ablation were discussed in detail with the patient today. Risk, benefits, and alternatives to EP study and radiofrequency ablation were also discussed in detail today. These risks include but are not limited to stroke, bleeding, vascular damage, tamponade, perforation, damage to the heart and other structures including valvular apparatus, AV block requiring pacemaker, worsening renal function, and death. The patient understands these risk and wishes to proceed at this time.  Thompson Grayer MD, [facility] 07/19/2018 7:13 AM

## 2018-07-19 NOTE — Discharge Summary (Addendum)
ELECTROPHYSIOLOGY PROCEDURE DISCHARGE SUMMARY    Patient ID: [name],  MRN: [id number], DOB/AGE: December 26, 1950 68 y.o.  Admit date: 07/19/2018 Discharge date: 07/23/2018  Primary Care Physician: Secundino Ginger, PA-C  Primary Cardiologist: Dr. Gwenlyn Found Electrophysiologist: [name]  Primary Discharge Diagnosis:  1. PVCs  Secondary Discharge Diagnosis:  1. NICM 2. HTN 3. DM 4. Obesity 5. OSA  Allergies  Allergen Reactions  . Penicillins Anaphylaxis and Swelling    Has patient had a PCN reaction causing immediate rash, facial/tongue/throat swelling, SOB or lightheadedness with hypotension: Yes Has patient had a PCN reaction causing severe rash involving mucus membranes or skin necrosis: No Has patient had a PCN reaction that required hospitalization: Yes Has patient had a PCN reaction occurring within the last 10 years: No    . Shellfish-Derived Products Anaphylaxis and Swelling  . Sulfa Antibiotics Anaphylaxis and Swelling  . Morphine And Related Nausea And Vomiting     Procedures This Admission: 1.  Electrophysiology study and radiofrequency catheter ablation on 07/18/2018 by Dr Rayann Heman.   This study demonstrated    Brief HPI: [name] is a 68 y.o. female with a past medical history as outlined above.  She was referred to EP service in the out patient setting 2/2 high PVC burden.  Risks, benefits, and alternatives to ablation were reviewed with the patient who wished to proceed.   Hospital Course:  The patient was admitted and underwent EPS/RFCA with details as outlined above. She was monitored on telemetry overnight which demonstrated SR, rare PVC.   R groin/procedure site was without complication.  The patient feels well this morning, denies any CP, SOB, site discomfort.  She was examined by Dr Rayann Heman who considered them stable for discharge to home.  Follow up will be arranged in 4 weeks.  Wound care and restrictions were reviewed with the patient prior  to discharge.   Physical Exam: Vitals:   07/19/18 2030 07/20/18 0525 07/20/18 0530 07/20/18 0833  BP: (!) 150/76  (!) 132/98 (!) 137/105  Pulse: 63  (!) 56 65  Resp: 17  16   Temp: 98.1 F (36.7 C)  (!) 97.5 F (36.4 C)   TempSrc: Oral  Oral   SpO2: 95%  99%   Weight:  94.3 kg    Height:        GEN- The patient is well appearing, alert and oriented x 3 today.   HEENT: normocephalic, atraumatic; sclera clear, conjunctiva pink; hearing intact; oropharynx clear; neck supple, no JVP Lymph- no cervical lymphadenopathy Lungs- CTA b/l, normal work of breathing.  No wheezes, rales, rhonchi Heart-RRR, no murmurs, rubs or gallops, PMI not laterally displaced GI- soft, non-tender, non-distended Extremities- no clubbing, cyanosis, or edema;R DP/PT pulses 2+ bilaterally,  R groin without hematoma/bruit MS- no significant deformity or atrophy Skin- warm and dry, no rash or lesion Psych- euthymic mood, full affect Neuro- strength and sensation are intact   Labs:   Lab Results  Component Value Date   WBC 5.7 07/09/2018   HGB 13.0 07/09/2018   HCT 38.0 07/09/2018   MCV 83 07/09/2018   PLT 154 07/09/2018   No results for input(s): NA, K, CL, CO2, BUN, CREATININE, CALCIUM, PROT, BILITOT, ALKPHOS, ALT, AST, GLUCOSE in the last 168 hours.  Invalid input(s): LABALBU  Discharge Medications:  Allergies as of 07/20/2018      Reactions   Penicillins Anaphylaxis, Swelling   Has patient had a PCN reaction causing immediate rash, facial/tongue/throat swelling, SOB  or lightheadedness with hypotension: Yes Has patient had a PCN reaction causing severe rash involving mucus membranes or skin necrosis: No Has patient had a PCN reaction that required hospitalization: Yes Has patient had a PCN reaction occurring within the last 10 years: No   Shellfish-derived Products Anaphylaxis, Swelling   Sulfa Antibiotics Anaphylaxis, Swelling   Morphine And Related Nausea And Vomiting      Medication List     TAKE these medications   ALPRAZolam 1 MG tablet Commonly known as:  XANAX Take 0.5 mg by mouth daily as needed for anxiety.   aspirin 81 MG tablet Take 81 mg by mouth daily.   atorvastatin 40 MG tablet Commonly known as:  LIPITOR Take 1 tablet (40 mg total) by mouth daily at 6 PM.   busPIRone 10 MG tablet Commonly known as:  BUSPAR Take 10 mg by mouth 2 (two) times daily.   cholecalciferol 1000 units tablet Commonly known as:  VITAMIN D Take 1,000 Units by mouth daily.   diclofenac 75 MG EC tablet Commonly known as:  VOLTAREN Take 75 mg by mouth 2 (two) times daily.   furosemide 20 MG tablet Commonly known as:  LASIX Take 1 tablet (20 mg total) by mouth daily.   gabapentin 100 MG capsule Commonly known as:  NEURONTIN Take 200 mg by mouth at bedtime.   HYDROcodone-acetaminophen 10-325 MG tablet Commonly known as:  NORCO Take 0.5-1 tablets by mouth every 6 (six) hours as needed for moderate pain or severe pain.   meclizine 25 MG tablet Commonly known as:  ANTIVERT Take 25 mg by mouth 3 (three) times daily as needed for dizziness.   metFORMIN 500 MG tablet Commonly known as:  GLUCOPHAGE Take 500 mg by mouth See admin instructions. PT only takes with a big meal   metoprolol succinate 25 MG 24 hr tablet Commonly known as:  TOPROL XL Take 0.5 tablets (12.5 mg total) by mouth daily.   NITROSTAT 0.4 MG SL tablet Generic drug:  nitroGLYCERIN Place 1 tablet under the tongue every 5 (five) minutes as needed for chest pain.   ofloxacin 0.3 % ophthalmic solution Commonly known as:  OCUFLOX Place 1 drop into the right eye 4 (four) times daily.   omeprazole 20 MG capsule Commonly known as:  PRILOSEC TAKE ONE CAPSULE BY MOUTH TWICE A DAY   sacubitril-valsartan 24-26 MG Commonly known as:  ENTRESTO Take 1 tablet by mouth 2 (two) times daily.   tiZANidine 4 MG capsule Commonly known as:  ZANAFLEX Take 4-8 mg by mouth at bedtime. For muscle pain        Disposition: Home  Discharge Instructions    Diet - low sodium heart healthy   Complete by:  As directed    Increase activity slowly   Complete by:  As directed      Follow-up Information    Thompson Grayer, MD Follow up.   Specialty:  Cardiology Why:  08/15/2018 @ 12:15PM Contact information: [place] [address] [phone]           Duration of Discharge Encounter: Greater than 30 minutes including physician time.  SignedTommye Standard, PA-C 07/20/2018 8:43 AM   I have seen, examined the patient, and reviewed the above assessment and plan.  Changes to above are made where necessary.  On exam, RRR.  PVCs successfully ablated.  Follow-up with me in 4 weeks.  Repeat echo in 3 months if PVCs remain resolved.  Co Sign: Jeneen Rinks  Shritha Bresee, MD 07/20/2018 7:48 PM

## 2018-07-19 NOTE — Discharge Instructions (Signed)
Post procedure care instructions No driving for 4 days. No lifting over 5 lbs for 1 week. No sexual activity for 1 week. You may return to work on 07/26/2018. Keep procedure site clean & dry. If you notice increased pain, swelling, bleeding or pus, call/return!  You may shower, but no soaking baths/hot tubs/pools for 1 week.

## 2018-07-19 NOTE — Anesthesia Postprocedure Evaluation (Signed)
Anesthesia Post Note  Patient: [name]  Procedure(s) Performed: V TACH ABLATION (N/A )     Patient location during evaluation: PACU Anesthesia Type: MAC Level of consciousness: awake and alert Pain management: pain level controlled Vital Signs Assessment: post-procedure vital signs reviewed and stable Respiratory status: spontaneous breathing, nonlabored ventilation, respiratory function stable and patient connected to nasal cannula oxygen Cardiovascular status: stable and blood pressure returned to baseline Postop Assessment: no apparent nausea or vomiting Anesthetic complications: no    Last Vitals:  Vitals:   07/19/18 1630 07/19/18 2030  BP: (!) 141/72 (!) 150/76  Pulse: 72 63  Resp:  17  Temp: 36.6 C 36.7 C  SpO2: 98% 95%    Last Pain:  Vitals:   07/19/18 2030  TempSrc: Oral  PainSc:                  [name] COKER

## 2018-07-19 NOTE — Transfer of Care (Signed)
Immediate Anesthesia Transfer of Care Note  Patient: [name]  Procedure(s) Performed: Stephanie Coup ABLATION (N/A )  Patient Location: PACU/ cath lab recovery   Anesthesia Type:MAC  Level of Consciousness: awake, alert  and oriented  Airway & Oxygen Therapy: Patient Spontanous Breathing and Patient connected to nasal cannula oxygen  Post-op Assessment: Report given to RN and Post -op Vital signs reviewed and stable  Post vital signs: Reviewed and stable  Last Vitals:  Vitals Value Taken Time  BP 149/64 07/19/2018 10:16 AM  Temp 36.1 C 07/19/2018 10:11 AM  Pulse 65 07/19/2018 10:19 AM  Resp 16 07/19/2018 10:19 AM  SpO2 100 % 07/19/2018 10:19 AM  Vitals shown include unvalidated device data.  Last Pain:  Vitals:   07/19/18 1011  TempSrc: Temporal  PainSc: 0-No pain         Complications: No apparent anesthesia complications

## 2018-07-20 DIAGNOSIS — I10 Essential (primary) hypertension: Secondary | ICD-10-CM | POA: Diagnosis not present

## 2018-07-20 DIAGNOSIS — I493 Ventricular premature depolarization: Secondary | ICD-10-CM

## 2018-07-20 DIAGNOSIS — E669 Obesity, unspecified: Secondary | ICD-10-CM | POA: Diagnosis not present

## 2018-07-20 DIAGNOSIS — E1142 Type 2 diabetes mellitus with diabetic polyneuropathy: Secondary | ICD-10-CM | POA: Diagnosis not present

## 2018-07-20 LAB — POCT ACTIVATED CLOTTING TIME
ACTIVATED CLOTTING TIME: 219 s
Activated Clotting Time: 263 seconds

## 2018-07-20 LAB — GLUCOSE, CAPILLARY: Glucose-Capillary: 106 mg/dL — ABNORMAL HIGH (ref 70–99)

## 2018-07-20 NOTE — Plan of Care (Signed)
  Problem: Clinical Measurements: Goal: Will remain free from infection Outcome: Progressing Note:  No s/s of infection noted.   Problem: Clinical Measurements: Goal: Respiratory complications will improve Outcome: Completed/Met Note:  No s/s of respiratory complications noted.

## 2018-07-23 ENCOUNTER — Telehealth: Payer: Self-pay | Admitting: Physician Assistant

## 2018-07-23 NOTE — Telephone Encounter (Signed)
Patient stepdaughter called because the patient had called her saying that her groin was bleeding.  She had a PVC ablation last Thursday, had done well until today.  Today, her right groin started bleeding.  The caller is not sure how much or how fast it is bleeding.  The patient was concerned about it and she is on her way over to the patient.  I recommended that she hold direct pressure to the groin for at least 5 minutes.  If that is successful, they do not have to come to the emergency room.  If that is successful, the patient is to be on bedrest overnight.  If that is not successful, or the groin starts bleeding again, they will have to come to the emergency room.  The daughter-in-law understands.  Rosaria Ferries, PA-C 07/23/2018 8:02 PM Beeper [phone]

## 2018-08-02 ENCOUNTER — Encounter (HOSPITAL_COMMUNITY): Payer: Medicare HMO | Admitting: Cardiology

## 2018-08-15 ENCOUNTER — Ambulatory Visit: Payer: Medicare Other | Admitting: Internal Medicine

## 2018-08-15 ENCOUNTER — Encounter: Payer: Self-pay | Admitting: Internal Medicine

## 2018-08-15 VITALS — BP 126/86 | HR 77 | Ht 65.0 in | Wt 205.4 lb

## 2018-08-15 DIAGNOSIS — I428 Other cardiomyopathies: Secondary | ICD-10-CM | POA: Diagnosis not present

## 2018-08-15 DIAGNOSIS — I493 Ventricular premature depolarization: Secondary | ICD-10-CM

## 2018-08-15 DIAGNOSIS — I10 Essential (primary) hypertension: Secondary | ICD-10-CM

## 2018-08-15 NOTE — Patient Instructions (Addendum)
Medication Instructions:  Your physician recommends that you continue on your current medications as directed. Please refer to the Current Medication list given to you today.  Labwork: None ordered.  Testing/Procedures: Your physician has requested that you have an echocardiogram. Echocardiography is a painless test that uses sound waves to create images of your heart. It provides your doctor with information about the size and shape of your heart and how well your heart's chambers and valves are working. This procedure takes approximately one hour. There are no restrictions for this procedure.  Please schedule for ECHO a FEW DAYS PRIOR to follow up appointment in 3 months.  Follow-Up: Your physician wants you to follow-up in: 3 months with [name].      Any Other Special Instructions Will Be Listed Below (If Applicable).  If you need a refill on your cardiac medications before your next appointment, please call your pharmacy.

## 2018-08-15 NOTE — Progress Notes (Signed)
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 female who presents today for routine electrophysiology followup.  Since her recent PVC ablation, the patient reports doing very well.  Today, she denies symptoms of palpitations, chest pain, shortness of breath,  lower extremity edema, dizziness, presyncope, or syncope.  The patient is otherwise without complaint today.   Past Medical History:  Diagnosis Date  . Adenomatous colon polyp 2006  . Arrhythmia    Heart  . Arthritis   . Asthma   . Chronic headaches   . Complication of anesthesia   . Diabetes mellitus type II, controlled ([place])    Diet  . Gastric polyp    hyperplastic  . GERD (gastroesophageal reflux disease)   . Headache   . Hyperlipidemia   . Hypertension   . IBS (irritable bowel syndrome)   . Kidney stones   . Lymphocytic colitis   . Myocardial infarct ([place])   . Neuropathy, peripheral   . PONV (postoperative nausea and vomiting)   . Sleep apnea    no CPAP  . Ulcer   . Urinary tract infection    Past Surgical History:  Procedure Laterality Date  . ABDOMINAL HYSTERECTOMY    . ABDOMINAL HYSTERECTOMY    . BACK SURGERY    . BLADDER REPAIR     x2  . CARDIAC CATHETERIZATION  04/2008  . CHOLECYSTECTOMY  2000  . LEFT HEART CATHETERIZATION WITH CORONARY ANGIOGRAM N/A 08/08/2012   Procedure: LEFT HEART CATHETERIZATION WITH CORONARY ANGIOGRAM;  Surgeon: Lorretta Harp, MD;  Location: [facility] CATH LAB;  Service: Cardiovascular;  Laterality: N/A;  . RIGHT/LEFT HEART CATH AND CORONARY ANGIOGRAPHY N/A 04/30/2018   Procedure: RIGHT/LEFT HEART CATH AND CORONARY ANGIOGRAPHY;  Surgeon: Lorretta Harp, MD;  Location: [place] CV LAB;  Service: Cardiovascular;  Laterality: N/A;  Stephanie Coup ABLATION N/A 07/19/2018   Procedure: Stephanie Coup ABLATION;  Surgeon: Thompson Grayer, MD;  Location: [place] CV LAB;  Service: Cardiovascular;  Laterality: N/A;    ROS- all systems  are reviewed and negatives except as per HPI above  Current Outpatient Medications  Medication Sig Dispense Refill  . ALPRAZolam (XANAX) 1 MG tablet Take 0.5 mg by mouth daily as needed for anxiety.     [name] aspirin 81 MG tablet Take 81 mg by mouth daily.     [name] atorvastatin (LIPITOR) 40 MG tablet Take 1 tablet (40 mg total) by mouth daily at 6 PM. 90 tablet 3  . busPIRone (BUSPAR) 10 MG tablet Take 10 mg by mouth 2 (two) times daily.     . cholecalciferol (VITAMIN D) 1000 UNITS tablet Take 1,000 Units by mouth daily.     . diclofenac (VOLTAREN) 75 MG EC tablet Take 75 mg by mouth 2 (two) times daily.    . furosemide (LASIX) 20 MG tablet Take 1 tablet (20 mg total) by mouth daily. 90 tablet 2  . gabapentin (NEURONTIN) 100 MG capsule Take 200 mg by mouth at bedtime.    [name] HYDROcodone-acetaminophen (NORCO) 10-325 MG per tablet Take 0.5-1 tablets by mouth every 6 (six) hours as needed for moderate pain or severe pain.     . meclizine (ANTIVERT) 25 MG tablet Take 25 mg by mouth 3 (three) times daily as needed for dizziness.    . metFORMIN (GLUCOPHAGE) 500 MG tablet Take 500 mg by mouth See admin instructions. PT only takes with a big meal    .  metoprolol succinate (TOPROL XL) 25 MG 24 hr tablet Take 0.5 tablets (12.5 mg total) by mouth daily. 45 tablet 3  . NITROSTAT 0.4 MG SL tablet Place 1 tablet under the tongue every 5 (five) minutes as needed for chest pain.   0  . ofloxacin (OCUFLOX) 0.3 % ophthalmic solution Place 1 drop into the right eye 4 (four) times daily.    [name] omeprazole (PRILOSEC) 20 MG capsule TAKE ONE CAPSULE BY MOUTH TWICE A DAY 60 capsule 3  . sacubitril-valsartan (ENTRESTO) 24-26 MG Take 1 tablet by mouth 2 (two) times daily. 60 tablet 11  . tiZANidine (ZANAFLEX) 4 MG capsule Take 4-8 mg by mouth at bedtime. For muscle pain     No current facility-administered medications for this visit.     Physical Exam: Vitals:   08/15/18 1228  BP: 126/86  Pulse: 77  SpO2: 98%  Weight: 205  lb 6.4 oz (93.2 kg)  Height: 5\' 5"  (1.651 m)    GEN- The patient is well appearing, alert and oriented x 3 today.   Head- normocephalic, atraumatic Eyes-  Sclera clear, conjunctiva pink Ears- hearing intact Oropharynx- clear Lungs- Clear to ausculation bilaterally, normal work of breathing Heart- Regular rate and rhythm, no murmurs, rubs or gallops, PMI not laterally displaced GI- soft, NT, ND, + BS Extremities- no clubbing, cyanosis, or edema  Wt Readings from Last 3 Encounters:  08/15/18 205 lb 6.4 oz (93.2 kg)  07/20/18 208 lb (94.3 kg)  06/11/18 205 lb 6.4 oz (93.2 kg)    EKG tracing ordered today is personally reviewed and shows sinus, no PVCs!  Assessment and Plan:  1. PVCs Successfully ablated along the posteromedial papillary muscle No further workup  2. Nonischemic CM I am hopeful that EF will recover with sinus  3. HTN Stable No change required today   Return in 3 months with an echo  Thompson Grayer MD, [facility] 08/15/2018 12:54 PM

## 2018-10-15 LAB — PROTIME-INR

## 2018-10-26 ENCOUNTER — Institutional Professional Consult (permissible substitution): Payer: Medicare Other | Admitting: Plastic Surgery

## 2018-11-05 ENCOUNTER — Telehealth: Payer: Self-pay

## 2018-11-05 NOTE — Telephone Encounter (Signed)
Virtual Visit Pre-Appointment Phone Call  "[name], I am calling you today to discuss your upcoming appointment. We are currently trying to limit exposure to the virus that causes COVID-19 by seeing patients at home rather than in the office."  1. "What is the BEST phone number to call the day of the visit?" - include this in appointment notes  2. "Do you have or have access to (through a family member/friend) a smartphone with video capability that we can use for your visit?" a. If yes - list this number in appt notes as "cell" (if different from BEST phone #) and list the appointment type as a VIDEO visit in appointment notes b. If no - list the appointment type as a PHONE visit in appointment notes  3. Confirm consent - "In the setting of the current Covid19 crisis, you are scheduled for a PHONE visit with your provider on 11/07/2018 at 2pm.  Just as we do with many in-office visits, in order for you to participate in this visit, we must obtain consent.  If you'd like, I can send this to your mychart (if signed up) or email for you to review.  Otherwise, I can obtain your verbal consent now.  All virtual visits are billed to your insurance company just like a normal visit would be.  By agreeing to a virtual visit, we'd like you to understand that the technology does not allow for your provider to perform an examination, and thus may limit your provider's ability to fully assess your condition. If your provider identifies any concerns that need to be evaluated in person, we will make arrangements to do so.  Finally, though the technology is pretty good, we cannot assure that it will always work on either your or our end, and in the setting of a video visit, we may have to convert it to a phone-only visit.  In either situation, we cannot ensure that we have a secure connection.  Are you willing to proceed?" STAFF: Did the patient verbally acknowledge consent to telehealth visit? Document YES/NO  here: YES  4. Advise patient to be prepared - "Two hours prior to your appointment, go ahead and check your blood pressure, pulse, oxygen saturation, and your weight (if you have the equipment to check those) and write them all down. When your visit starts, your provider will ask you for this information. If you have an Apple Watch or Kardia device, please plan to have heart rate information ready on the day of your appointment. Please have a pen and paper handy nearby the day of the visit as well."  5. Give patient instructions for MyChart download to smartphone OR Doximity/Doxy.me as below if video visit (depending on what platform provider is using)  6. Inform patient they will receive a phone call 15 minutes prior to their appointment time (may be from unknown caller ID) so they should be prepared to answer    TELEPHONE CALL NOTE  [name] has been deemed a candidate for a follow-up tele-health visit to limit community exposure during the Covid-19 pandemic. I spoke with the patient via phone to ensure availability of phone/video source, confirm preferred email & phone number, and discuss instructions and expectations.  I reminded [name] to be prepared with any vital sign and/or heart rhythm information that could potentially be obtained via home monitoring, at the time of her visit. I reminded [name] to expect a phone call prior to her  visit.  Harold Hedge, CMA 11/05/2018 5:15 PM   INSTRUCTIONS FOR DOWNLOADING THE MYCHART APP TO SMARTPHONE  - The patient must first make sure to have activated MyChart and know their login information - If Apple, go to [employer] and type in MyChart in the search bar and download the app. If Android, ask patient to go to [employer] and type in [place] in the search bar and download the app. The app is free but as with any other app downloads, their phone may require them to verify saved payment information or Apple/Android  password.  - The patient will need to then log into the app with their MyChart username and password, and select [facility] as their healthcare provider to link the account. When it is time for your visit, go to the MyChart app, find appointments, and click Begin Video Visit. Be sure to Select Allow for your device to access the Microphone and Camera for your visit. You will then be connected, and your provider will be with you shortly.  **If they have any issues connecting, or need assistance please contact MyChart service desk [phone])**  **If using a computer, in order to ensure the best quality for their visit they will need to use either of the following Internet Browsers: [employer], or Google Chrome**  IF USING DOXIMITY or DOXY.ME - The patient will receive a link just prior to their visit by text.     FULL LENGTH CONSENT FOR TELE-HEALTH VISIT   I hereby voluntarily request, consent and authorize [facility] and its employed or contracted physicians, physician assistants, nurse practitioners or other licensed health care professionals (the Practitioner), to provide me with telemedicine health care services (the "Services") as deemed necessary by the treating Practitioner. I acknowledge and consent to receive the Services by the Practitioner via telemedicine. I understand that the telemedicine visit will involve communicating with the Practitioner through live audiovisual communication technology and the disclosure of certain medical information by electronic transmission. I acknowledge that I have been given the opportunity to request an in-person assessment or other available alternative prior to the telemedicine visit and am voluntarily participating in the telemedicine visit.  I understand that I have the right to withhold or withdraw my consent to the use of telemedicine in the course of my care at any time, without affecting my right to future care or treatment,  and that the Practitioner or I may terminate the telemedicine visit at any time. I understand that I have the right to inspect all information obtained and/or recorded in the course of the telemedicine visit and may receive copies of available information for a reasonable fee.  I understand that some of the potential risks of receiving the Services via telemedicine include:  [name] Delay or interruption in medical evaluation due to technological equipment failure or disruption; . Information transmitted may not be sufficient (e.g. poor resolution of images) to allow for appropriate medical decision making by the Practitioner; and/or  . In rare instances, security protocols could fail, causing a breach of personal health information.  Furthermore, I acknowledge that it is my responsibility to provide information about my medical history, conditions and care that is complete and accurate to the best of my ability. I acknowledge that Practitioner's advice, recommendations, and/or decision may be based on factors not within their control, such as incomplete or inaccurate data provided by me or distortions of diagnostic images or specimens that may result from electronic transmissions. I  understand that the practice of medicine is not an exact science and that Practitioner makes no warranties or guarantees regarding treatment outcomes. I acknowledge that I will receive a copy of this consent concurrently upon execution via email to the email address I last provided but may also request a printed copy by calling the office of [facility].    I understand that my insurance will be billed for this visit.   I have read or had this consent read to me. . I understand the contents of this consent, which adequately explains the benefits and risks of the Services being provided via telemedicine.  . I have been provided ample opportunity to ask questions regarding this consent and the Services and have had my questions  answered to my satisfaction. . I give my informed consent for the services to be provided through the use of telemedicine in my medical care  By participating in this telemedicine visit I agree to the above.

## 2018-11-05 NOTE — Telephone Encounter (Signed)
Spoke with patient to get verbal consent for her upcoming evisit. She stated she is very short of breath and wanted to know if she would be completing her ECHO on Wednesday. I told her I wasn't sure if the office was doing testing and I would reach out to the schedulers and Geilsa and once I get a response ill get back to her. She stated her PCP at [facility] has her scheduled for a ECHO there on the 15th and stated of she could not get the test here she would have it done there. dvised pt that I would contact her once I heard back. She voiced understanding.

## 2018-11-07 ENCOUNTER — Telehealth: Payer: Self-pay

## 2018-11-07 ENCOUNTER — Ambulatory Visit (HOSPITAL_COMMUNITY): Payer: Medicare Other | Attending: Cardiology

## 2018-11-07 ENCOUNTER — Telehealth: Payer: Medicare Other | Admitting: Cardiology

## 2018-11-07 ENCOUNTER — Encounter (HOSPITAL_COMMUNITY): Payer: Self-pay

## 2018-11-07 NOTE — Telephone Encounter (Signed)
Left message for patient to contact office. Called patient at 2:17pm see other phone note.

## 2018-11-07 NOTE — Telephone Encounter (Signed)
Called patient to start virtual visit but she did not answer. Left message for her to contact office.

## 2018-11-07 NOTE — Telephone Encounter (Signed)
Tried to contact pt again but no answer.

## 2018-11-13 ENCOUNTER — Telehealth: Payer: Self-pay

## 2018-11-13 NOTE — Telephone Encounter (Signed)
Spoke with pt regarding appt on 11/14/18. Pt was advise to check vitals prior to appt. Pt concerns were address

## 2018-11-14 ENCOUNTER — Other Ambulatory Visit: Payer: Self-pay

## 2018-11-14 ENCOUNTER — Encounter: Payer: Self-pay | Admitting: Internal Medicine

## 2018-11-14 ENCOUNTER — Telehealth (INDEPENDENT_AMBULATORY_CARE_PROVIDER_SITE_OTHER): Payer: Medicare Other | Admitting: Internal Medicine

## 2018-11-14 DIAGNOSIS — I428 Other cardiomyopathies: Secondary | ICD-10-CM | POA: Diagnosis not present

## 2018-11-14 DIAGNOSIS — I10 Essential (primary) hypertension: Secondary | ICD-10-CM | POA: Diagnosis not present

## 2018-11-14 DIAGNOSIS — I493 Ventricular premature depolarization: Secondary | ICD-10-CM | POA: Diagnosis not present

## 2018-11-14 NOTE — Progress Notes (Signed)
Electrophysiology TeleHealth Note   Due to national recommendations of social distancing due to [place] 19, an audio telehealth visit is felt to be most appropriate for this patient at this time.  Verbal consent obtained from patient by me today. Her smartphone broke.  She does not have internet or capacity to do a virtual visit.   Date:  11/14/2018   ID:  [name], DOB Mar 02, 1951, MRN [id number]  Location: patient's home  Provider location: [address] [place]  Evaluation Performed: Follow-up visit  PCP:  Secundino Ginger, PA-C  Cardiologist:  Quay Burow, MD  Electrophysiologist:  Dr Rayann Heman  Chief Complaint:  PVCs  History of Present Illness:    [name] is a 67 y.o. female who presents via audio conferencing for a telehealth visit today.  Since last being seen in our clinic, the patient reports doing very well.  Today, she denies symptoms of palpitations, chest pain,  lower extremity edema, dizziness, presyncope, or syncope.  Her SOB persists. The patient is otherwise without complaint today.  The patient denies symptoms of fevers, chills, cough, or new SOB worrisome for COVID 19.  Past Medical History:  Diagnosis Date  . Adenomatous colon polyp 2006  . Arrhythmia    Heart  . Arthritis   . Asthma   . Chronic headaches   . Complication of anesthesia   . Diabetes mellitus type II, controlled ([place])    Diet  . Gastric polyp    hyperplastic  . GERD (gastroesophageal reflux disease)   . Headache   . Hyperlipidemia   . Hypertension   . IBS (irritable bowel syndrome)   . Kidney stones   . Lymphocytic colitis   . Myocardial infarct ([place])   . Neuropathy, peripheral   . PONV (postoperative nausea and vomiting)   . Sleep apnea    no CPAP  . Ulcer   . Urinary tract infection     Past Surgical History:  Procedure Laterality Date  . ABDOMINAL HYSTERECTOMY    . ABDOMINAL HYSTERECTOMY    . BACK SURGERY    . BLADDER REPAIR     x2  .  CARDIAC CATHETERIZATION  04/2008  . CHOLECYSTECTOMY  2000  . LEFT HEART CATHETERIZATION WITH CORONARY ANGIOGRAM N/A 08/08/2012   Procedure: LEFT HEART CATHETERIZATION WITH CORONARY ANGIOGRAM;  Surgeon: Lorretta Harp, MD;  Location: [name] CATH LAB;  Service: Cardiovascular;  Laterality: N/A;  . RIGHT/LEFT HEART CATH AND CORONARY ANGIOGRAPHY N/A 04/30/2018   Procedure: RIGHT/LEFT HEART CATH AND CORONARY ANGIOGRAPHY;  Surgeon: Lorretta Harp, MD;  Location: [facility] CV LAB;  Service: Cardiovascular;  Laterality: N/A;  Stephanie Coup ABLATION N/A 07/19/2018   Procedure: Stephanie Coup ABLATION;  Surgeon: Thompson Grayer, MD;  Location: [facility] CV LAB;  Service: Cardiovascular;  Laterality: N/A;    Current Outpatient Medications  Medication Sig Dispense Refill  . ALPRAZolam (XANAX) 1 MG tablet Take 0.5 mg by mouth daily as needed for anxiety.     [name] aspirin 81 MG tablet Take 81 mg by mouth daily.     [name] atorvastatin (LIPITOR) 40 MG tablet Take 1 tablet (40 mg total) by mouth daily at 6 PM. 90 tablet 3  . busPIRone (BUSPAR) 10 MG tablet Take 10 mg by mouth 2 (two) times daily.     . cholecalciferol (VITAMIN D) 1000 UNITS tablet Take 1,000 Units by mouth daily.     [name] gabapentin (NEURONTIN) 100 MG capsule Take 200 mg  by mouth at bedtime.    [name] HYDROcodone-acetaminophen (NORCO) 10-325 MG per tablet Take 0.5-1 tablets by mouth every 6 (six) hours as needed for moderate pain or severe pain.     . meclizine (ANTIVERT) 25 MG tablet Take 25 mg by mouth 3 (three) times daily as needed for dizziness.    . metoprolol succinate (TOPROL XL) 25 MG 24 hr tablet Take 0.5 tablets (12.5 mg total) by mouth daily. 45 tablet 3  . NITROSTAT 0.4 MG SL tablet Place 1 tablet under the tongue every 5 (five) minutes as needed for chest pain.   0  . ofloxacin (OCUFLOX) 0.3 % ophthalmic solution Place 1 drop into the right eye 4 (four) times daily.    [name] omeprazole (PRILOSEC) 20 MG capsule TAKE ONE CAPSULE BY MOUTH TWICE A DAY 60 capsule 3   . sacubitril-valsartan (ENTRESTO) 24-26 MG Take 1 tablet by mouth 2 (two) times daily. 60 tablet 11  . tiZANidine (ZANAFLEX) 4 MG capsule Take 4-8 mg by mouth at bedtime. For muscle pain     No current facility-administered medications for this visit.     Allergies:   Penicillins; Shellfish-derived products; Sulfa antibiotics; and Morphine and related   Social History:  The patient  reports that she has never smoked. She has never used smokeless tobacco. She reports that she does not drink alcohol or use drugs.   Family History:  The patient's  family history includes Colon cancer in her paternal uncle; Hypertension in her mother.   ROS:  Please see the history of present illness.   All other systems are personally reviewed and negative.    Exam:    Vital Signs:  BP 124/82   Pulse 61   Ht 5\' 5"  (1.651 m)   Wt 211 lb 6.4 oz (95.9 kg)   BMI 35.18 kg/m   Well sounding   Labs/Other Tests and Data Reviewed:    Recent Labs: 04/25/2018: TSH 1.180 07/09/2018: BUN 20; Creatinine, Ser 0.81; Hemoglobin 13.0; Platelets 154; Potassium 3.6; Sodium 143   Wt Readings from Last 3 Encounters:  11/14/18 211 lb 6.4 oz (95.9 kg)  08/15/18 205 lb 6.4 oz (93.2 kg)  07/20/18 208 lb (94.3 kg)     Other studies personally reviewed: Additional studies/ records that were reviewed today include: my prior notes  Review of the above records today demonstrates: as above   ASSESSMENT & PLAN:    1.  PVCs Doing well s/p ablation Echo is pending through [facility]  2. Nonischemic CM He will have an echo through [facility] office My hope is that her EF has recovered with PVC ablation  3. HTN Stable No change required today  4. COVID 19 screen The patient denies symptoms of COVID 19 at this time.  The importance of social distancing was discussed today.  Follow-up:  4 months  Current medicines are reviewed at length with the patient today.   The patient does not have concerns regarding her  medicines.  The following changes were made today:  none  Labs/ tests ordered today include:  No orders of the defined types were placed in this encounter.  Patient Risk:  after full review of this patients clinical status, I feel that they are at moderate risk at this time.  Today, I have spent 15 minutes with the patient with telehealth technology discussing PVCs.    Army Fossa, MD  11/14/2018 8:32 AM     [facility]  [address] [place] [address] [phone] (office) [phone] (fax)

## 2018-11-16 ENCOUNTER — Other Ambulatory Visit: Payer: Self-pay | Admitting: Cardiovascular Disease

## 2018-11-21 ENCOUNTER — Telehealth: Payer: Self-pay | Admitting: Cardiovascular Disease

## 2018-11-21 NOTE — Telephone Encounter (Signed)
Mychart pending, no smartphone, consent (11/05/18), pre reg complete 11/21/18 AF

## 2018-11-23 ENCOUNTER — Encounter: Payer: Self-pay | Admitting: Cardiovascular Disease

## 2018-11-23 ENCOUNTER — Telehealth: Payer: Self-pay

## 2018-11-23 ENCOUNTER — Telehealth (INDEPENDENT_AMBULATORY_CARE_PROVIDER_SITE_OTHER): Payer: Medicare Other | Admitting: Cardiovascular Disease

## 2018-11-23 DIAGNOSIS — I10 Essential (primary) hypertension: Secondary | ICD-10-CM | POA: Diagnosis not present

## 2018-11-23 DIAGNOSIS — E785 Hyperlipidemia, unspecified: Secondary | ICD-10-CM | POA: Diagnosis not present

## 2018-11-23 DIAGNOSIS — IMO0001 Reserved for inherently not codable concepts without codable children: Secondary | ICD-10-CM

## 2018-11-23 DIAGNOSIS — I499 Cardiac arrhythmia, unspecified: Secondary | ICD-10-CM | POA: Diagnosis not present

## 2018-11-23 DIAGNOSIS — I493 Ventricular premature depolarization: Secondary | ICD-10-CM

## 2018-11-23 DIAGNOSIS — I428 Other cardiomyopathies: Secondary | ICD-10-CM | POA: Diagnosis not present

## 2018-11-23 DIAGNOSIS — G4730 Sleep apnea, unspecified: Secondary | ICD-10-CM

## 2018-11-23 DIAGNOSIS — Z0389 Encounter for observation for other suspected diseases and conditions ruled out: Secondary | ICD-10-CM

## 2018-11-23 DIAGNOSIS — Z9889 Other specified postprocedural states: Secondary | ICD-10-CM

## 2018-11-23 DIAGNOSIS — I498 Other specified cardiac arrhythmias: Secondary | ICD-10-CM

## 2018-11-23 NOTE — Patient Instructions (Signed)

## 2018-11-23 NOTE — Telephone Encounter (Signed)
Patient and/or DPR-approved person aware of AVS instructions and verbalized understanding. Letter including After Visit Summary and any other necessary documents to be mailed to the patient's address on file.  

## 2018-11-23 NOTE — Progress Notes (Signed)
Virtual Visit via Video Note   This visit type was conducted due to national recommendations for restrictions regarding the COVID-19 Pandemic (e.g. social distancing) in an effort to limit this patient's exposure and mitigate transmission in our community.  Due to her co-morbid illnesses, this patient is at least at moderate risk for complications without adequate follow up.  This format is felt to be most appropriate for this patient at this time.  All issues noted in this document were discussed and addressed.  A limited physical exam was performed with this format.  Please refer to the patient's chart for her consent to telehealth for [facility].   Date:  11/23/2018   ID:  [name], DOB 05/16/1951, MRN [id number]  Patient Location: Home Provider Location: Home  PCP:  Secundino Ginger, PA-C  Cardiologist:  Quay Burow, MD  Electrophysiologist:  Thompson Grayer, MD   Evaluation Performed:  Follow-Up Visit  Chief Complaint: Nonischemic cardiomyopathy  History of Present Illness:    [name] is a 68 y.o.  severely overweight married Caucasian female mother of 3 children (1 deceased) 02-09-2023 last saw in the office  05/29/2018. Unfortunately, her husband of 40 years passed away in 10/10/15 she is still grieving. Shewasalso taking care of her elderly motherhas since passed away11/18. Her sister also moved in with her. I last saw her in the office 03/17/2017.[name] Her primary care provider is Roque Cash. She has a history of hypertension, hyperlipidemia, and diabetes. She has never had a heart attack or stroke. She does have sleep apnea. She has had 2 normal cardiac catheterizations in 2007/10/10 and 14 respectively. She does get occasional atypical chest pain.Since I saw her year ago she's remained stable with occasional episodes of chest pain Since I saw her a year ago she is remained stable. She has episodic atypical chest pain as well. She is also recently complained of  palpitations which I suspect her PVCs. She is under a lot of stress and she has high anxiety. She had a 2D echo performed 04/23/2018 revealing a significant decline in her LVEF to 20 to 25% compared to her previous echo performed 10/21/2014 which was essentially normal. She does complain of dyspnea on exertion and atypical chest pain.  I performed cardiac catheterization on her 04/30/2018 revealing essentially normal coronary arteries with moderately severe LV dysfunction and EF of 35 to 40%.  She is currently on low-dose Entresto and low-dose beta-blocker.  Since I saw her 6 months ago she did undergo PVC ablation by [name] on 07/19/18 and continues to improve clinically.  A 2D echo performed 11/16/2018 revealed marked improvement in her EF from 20 to 25% up to 55 to 60%.  She says she is getting her energy back.  The patient does not have symptoms concerning for COVID-19 infection (fever, chills, cough, or new shortness of breath).    Past Medical History:  Diagnosis Date  . Adenomatous colon polyp 10-09-04  . Arrhythmia    Heart  . Arthritis   . Asthma   . Chronic headaches   . Complication of anesthesia   . Diabetes mellitus type II, controlled ([place])    Diet  . Gastric polyp    hyperplastic  . GERD (gastroesophageal reflux disease)   . Headache   . Hyperlipidemia   . Hypertension   . IBS (irritable bowel syndrome)   . Kidney stones   . Lymphocytic colitis   . Myocardial infarct ([place])   . Neuropathy, peripheral   .  PONV (postoperative nausea and vomiting)   . Sleep apnea    no CPAP  . Ulcer   . Urinary tract infection    Past Surgical History:  Procedure Laterality Date  . ABDOMINAL HYSTERECTOMY    . ABDOMINAL HYSTERECTOMY    . BACK SURGERY    . BLADDER REPAIR     x2  . CARDIAC CATHETERIZATION  04/2008  . CHOLECYSTECTOMY  2000  . LEFT HEART CATHETERIZATION WITH CORONARY ANGIOGRAM N/A 08/08/2012   Procedure: LEFT HEART CATHETERIZATION WITH CORONARY ANGIOGRAM;  Surgeon:  Lorretta Harp, MD;  Location: [facility] CATH LAB;  Service: Cardiovascular;  Laterality: N/A;  . RIGHT/LEFT HEART CATH AND CORONARY ANGIOGRAPHY N/A 04/30/2018   Procedure: RIGHT/LEFT HEART CATH AND CORONARY ANGIOGRAPHY;  Surgeon: Lorretta Harp, MD;  Location: [place] CV LAB;  Service: Cardiovascular;  Laterality: N/A;  Stephanie Coup ABLATION N/A 07/19/2018   Procedure: Stephanie Coup ABLATION;  Surgeon: Thompson Grayer, MD;  Location: [place] CV LAB;  Service: Cardiovascular;  Laterality: N/A;     Current Meds  Medication Sig  . ALPRAZolam (XANAX) 1 MG tablet Take 0.5 mg by mouth daily as needed for anxiety.   [name] aspirin 81 MG tablet Take 81 mg by mouth daily.   [name] atorvastatin (LIPITOR) 40 MG tablet Take 1 tablet (40 mg total) by mouth daily at 6 PM.  . busPIRone (BUSPAR) 10 MG tablet Take 10 mg by mouth 2 (two) times daily.   . cholecalciferol (VITAMIN D) 1000 UNITS tablet Take 1,000 Units by mouth daily.   [name] gabapentin (NEURONTIN) 100 MG capsule Take 200 mg by mouth at bedtime.  [name] HYDROcodone-acetaminophen (NORCO) 10-325 MG per tablet Take 0.5-1 tablets by mouth every 6 (six) hours as needed for moderate pain or severe pain.   . meclizine (ANTIVERT) 25 MG tablet Take 25 mg by mouth 3 (three) times daily as needed for dizziness.  . metoprolol succinate (TOPROL-XL) 25 MG 24 hr tablet Take 0.5 tablets (12.5 mg total) by mouth daily.  [name] NITROSTAT 0.4 MG SL tablet Place 1 tablet under the tongue every 5 (five) minutes as needed for chest pain.   [name] omeprazole (PRILOSEC) 20 MG capsule TAKE ONE CAPSULE BY MOUTH TWICE A DAY  . sacubitril-valsartan (ENTRESTO) 24-26 MG Take 1 tablet by mouth 2 (two) times daily.  [name] tiZANidine (ZANAFLEX) 4 MG capsule Take 4-8 mg by mouth at bedtime. For muscle pain     Allergies:   Penicillins; Shellfish-derived products; Sulfa antibiotics; and Morphine and related   Social History   Tobacco Use  . Smoking status: Never Smoker  . Smokeless tobacco: Never Used  Substance  Use Topics  . Alcohol use: No  . Drug use: No     Family Hx: The patient's family history includes Colon cancer in her paternal uncle; Hypertension in her mother. There is no history of Esophageal cancer, Stomach cancer, or Rectal cancer.  ROS:   Please see the history of present illness.     All other systems reviewed and are negative.   Prior CV studies:   The following studies were reviewed today:  2D echocardiogram performed 11/16/2018  Labs/Other Tests and Data Reviewed:    EKG:  No ECG reviewed.  Recent Labs: 04/25/2018: TSH 1.180 07/09/2018: BUN 20; Creatinine, Ser 0.81; Hemoglobin 13.0; Platelets 154; Potassium 3.6; Sodium 143   Recent Lipid Panel Lab Results  Component Value Date/Time   CHOL  04/20/2008 04:00 AM    139  ATP III CLASSIFICATION:  <200     mg/dL   Desirable  200-239  mg/dL   Borderline High  >=240    mg/dL   High   TRIG 118 04/20/2008 04:00 AM   HDL 37 (L) 04/20/2008 04:00 AM   CHOLHDL 3.8 04/20/2008 04:00 AM   LDLCALC  04/20/2008 04:00 AM    78        Total Cholesterol/HDL:CHD Risk Coronary Heart Disease Risk Table                     Men   Women  1/2 Average Risk   3.4   3.3    Wt Readings from Last 3 Encounters:  11/23/18 206 lb (93.4 kg)  11/14/18 211 lb 6.4 oz (95.9 kg)  08/15/18 205 lb 6.4 oz (93.2 kg)     Objective:    Vital Signs:  BP (!) 142/89   Pulse 73   Temp 98.9 F (37.2 C)   Ht 5\' 5"  (1.651 m)   Wt 206 lb (93.4 kg)   BMI 34.28 kg/m    VITAL SIGNS:  reviewed GEN:  no acute distress RESPIRATORY:  normal respiratory effort, symmetric expansion NEURO:  alert and oriented x 3, no obvious focal deficit PSYCH:  normal affect  ASSESSMENT & PLAN:    1. Nonischemic cardiomyopathy- history of nonischemic cardiomyopathy by cardiac cath which I performed 05/01/2018 demonstrating normal coronary arteries with an EF of 20 to 25%.  She is on metoprolol and Entresto.  Her recent 2D echo performed 11/16/2018 revealed marked  improvement in her ejection fraction up to 55 to 60%. 2. Essential hypertension- history of essential hypertension with blood pressure measured today by the patient at home at 142/89 with a pulse of 73.  She is on Entresto and metoprolol 3. Hyperlipidemia-on atorvastatin followed by her PCP 4. Excessive PVCs- status post PVC ablation by [name] 07/19/2018. 5.  The signs and symptoms of COVID-19 were discussed with the patient and how to seek care for testing (follow up with PCP or arrange E-visit).  The importance of social distancing was discussed today.  Time:   Today, I have spent 9 minutes with the patient with telehealth technology discussing the above problems.     Medication Adjustments/Labs and Tests Ordered: Current medicines are reviewed at length with the patient today.  Concerns regarding medicines are outlined above.   Tests Ordered: No orders of the defined types were placed in this encounter.   Medication Changes: No orders of the defined types were placed in this encounter.   Disposition:  Follow up in 6 month(s)  Signed, Quay Burow, MD  11/23/2018 2:21 PM    [place]

## 2018-11-27 ENCOUNTER — Other Ambulatory Visit: Payer: Self-pay | Admitting: Cardiovascular Disease

## 2018-11-27 DIAGNOSIS — I10 Essential (primary) hypertension: Secondary | ICD-10-CM

## 2018-11-27 DIAGNOSIS — I428 Other cardiomyopathies: Secondary | ICD-10-CM

## 2018-11-27 DIAGNOSIS — I519 Heart disease, unspecified: Secondary | ICD-10-CM

## 2018-11-27 DIAGNOSIS — E7800 Pure hypercholesterolemia, unspecified: Secondary | ICD-10-CM

## 2019-01-07 ENCOUNTER — Telehealth: Payer: Self-pay | Admitting: *Deleted

## 2019-01-07 NOTE — Telephone Encounter (Signed)
Patient left a msg on the refill vm stating that she does not have enough money to have the entresto refilled as she is in the donut hole. She would like to know of she can pick up samples to last her through the month of July and she will see if she can come up with the money to have it refilled in August. Patient can be reached at [phone]. Thanks, MI

## 2019-01-08 NOTE — Telephone Encounter (Signed)
Left detailed message samples at the front desk with pt assistance form

## 2019-01-08 NOTE — Telephone Encounter (Signed)
Routed to NL triage 

## 2019-02-05 ENCOUNTER — Telehealth: Payer: Self-pay

## 2019-02-05 NOTE — Telephone Encounter (Signed)
No DPR on file for pt current cell phone number on file [phone] (M). lmtcb to discuss in more detail completion of a form for pt    Provider section of pt Entresto form completed. Pt needs to complete her portion of the form. She may come to the office to pick this up unless she would prefer to have the form mailed to her. She needs to return form to the office after completing her section of the form. [facility] Patient Support Program number for assistance: [phone]

## 2019-02-25 ENCOUNTER — Telehealth: Payer: Self-pay | Admitting: Cardiovascular Disease

## 2019-02-25 NOTE — Telephone Encounter (Signed)
Spoke with pt who states she has CP, SOB, and dizziness. Per pt, CP has been ongoing for months (discussed with Dr. Gwenlyn Found during last virtual visit) and she had an episode this morning. Pt feels CP may be stress-related and states she has hx of anxiety and took her alprazolam this AM around 9:30. Unable to check vitals at this time. Pt states she has left-sided CP that she describes as feeling 'like somebody hit [her]' and as a 'stabbing' pain. She states CP lasts a few minutes then goes away. She states she took one nitro for this but had no relief and is still having intermittent CP. It appears the last time pt nitro was refilled was 09/2014. Pt states she was unable to verify date of expiration but believes the nitro on hand is old. Pt states she has had SOB for months but has noticed that it has been worsening over time. Pt also c/o dizziness that accompanies CP. Pt apprehensive about presenting to ED for eval. Pt set for virtual appt 8/27 at 11:30am with Jory Sims, DNP. Informed pt of MI sx to monitor for and informed her that nurse would verify refill for nitro. Pt also requesting samples of Entresto 24/26. Nurse informed pt that samples available at front desk for pickup along with copay card. Pt unaware of appt 9/14 with [name] At 11:30am. Reviewed appt time, date, and location with pt. She verbalized understanding.    Medication Samples have been provided to the patient.  Drug name: ENTRESTO    Strength: 24/26        Qty: 2 BOTTLES  LOT:  [id number] Exp.Date: 01/2021    Cyler Kappes O Earleen Aoun 1:16 PM 02/25/2019

## 2019-02-25 NOTE — Telephone Encounter (Signed)
New Message   Pt c/o of Chest Pain: STAT if CP now or developed within 24 hours  1. Are you having CP right now? Yes  2. Are you experiencing any other symptoms (ex. SOB, nausea, vomiting, sweating)? SOB, chest pain, and dizziness  3. How long have you been experiencing CP? 2 month, but is recently worse  4. Is your CP continuous or coming and going? Coming and going  5. Have you taken Nitroglycerin? Yes ?

## 2019-02-25 NOTE — Telephone Encounter (Signed)
Yes

## 2019-02-26 MED ORDER — NITROSTAT 0.4 MG SL SUBL: 0.4000 mg | SUBLINGUAL_TABLET | SUBLINGUAL | 0 refills | Status: DC | PRN

## 2019-02-26 NOTE — Telephone Encounter (Signed)
Med refill sent to pt pharmacy

## 2019-02-28 ENCOUNTER — Telehealth (INDEPENDENT_AMBULATORY_CARE_PROVIDER_SITE_OTHER): Payer: Medicare Other | Admitting: Adult Health

## 2019-02-28 ENCOUNTER — Encounter: Payer: Self-pay | Admitting: Adult Health

## 2019-02-28 VITALS — BP 121/71 | HR 63 | Ht 65.0 in | Wt 210.0 lb

## 2019-02-28 DIAGNOSIS — IMO0001 Reserved for inherently not codable concepts without codable children: Secondary | ICD-10-CM

## 2019-02-28 DIAGNOSIS — Z0389 Encounter for observation for other suspected diseases and conditions ruled out: Secondary | ICD-10-CM | POA: Diagnosis not present

## 2019-02-28 DIAGNOSIS — E7800 Pure hypercholesterolemia, unspecified: Secondary | ICD-10-CM

## 2019-02-28 DIAGNOSIS — R0789 Other chest pain: Secondary | ICD-10-CM

## 2019-02-28 DIAGNOSIS — F419 Anxiety disorder, unspecified: Secondary | ICD-10-CM

## 2019-02-28 NOTE — Progress Notes (Signed)
Virtual Visit via Telephone Note   This visit type was conducted due to national recommendations for restrictions regarding the COVID-19 Pandemic (e.g. social distancing) in an effort to limit this patient's exposure and mitigate transmission in our community.  Due to her co-morbid illnesses, this patient is at least at moderate risk for complications without adequate follow up.  This format is felt to be most appropriate for this patient at this time.  The patient did not have access to video technology/had technical difficulties with video requiring transitioning to audio format only (telephone).  All issues noted in this document were discussed and addressed.  No physical exam could be performed with this format.  Please refer to the patient's chart for her  consent to telehealth for [facility].   Date:  02/28/2019   ID:  [name], DOB Jun 18, 1951, MRN [id number]  Patient Location: Home Provider Location: Home  PCP:  Secundino Ginger, PA-C  Cardiologist:  Quay Burow, MD  Electrophysiologist:  Thompson Grayer, MD   Evaluation Performed:  Follow-Up Visit  Chief Complaint:  Chest Pain  History of Present Illness:    [name] is a 68 y.o. female we are following for ongoing assessment and management of hypertension,, hyperlipidemia, and diabetes.  She also has severe obesity and OSA.  She has occasional atypical chest pain.  The patient was seen last by Dr. Alvester Chou on 11/23/2018 at which time she expressed that she is under a lot of stress and has high anxiety related to family situations.  Cardiac catheterization completed in 04/30/2018 revealed essentially normal coronary arteries with moderately to severe LV dysfunction with an EF of 35% to 40%.  She is currently on low-dose Entresto and low-dose beta-blocker.  She is also been seen by [name], electrophysiologist for PVC ablation which was completed on 07/09/2018, with improvement of symptoms.    Repeat echocardiogram  revealed marked improvement of her EF from 25% to 55 to 60%.  When last seen in the office she was feeling much better and getting her energy back.  On 02/25/2019 she called her office with complaints of recurrent chest pain.  It was not unlike her atypical chest pain.  She did take nitroglycerin for relief.  She comes today feeling some better but continues to speak a lot of current stress levels and anxieties.  She is afraid to go out of her house due to panic attacks.  She is been referred to psychiatry by PCP and is due to see them sometime next week.  She believes that her chest discomfort is related to the amount of anxiety that she is experiencing and stress that she is under.  She states that she is having financial issues affording her Entresto.  She is due to come by and pick up some samples.  The patient does not have symptoms concerning for COVID-19 infection (fever, chills, cough, or new shortness of breath).    Past Medical History:  Diagnosis Date  . Adenomatous colon polyp 2006  . Arrhythmia    Heart  . Arthritis   . Asthma   . Chronic headaches   . Complication of anesthesia   . Diabetes mellitus type II, controlled ([place])    Diet  . Gastric polyp    hyperplastic  . GERD (gastroesophageal reflux disease)   . Headache   . Hyperlipidemia   . Hypertension   . IBS (irritable bowel syndrome)   . Kidney stones   . Lymphocytic colitis   .  Myocardial infarct ([place])   . Neuropathy, peripheral   . PONV (postoperative nausea and vomiting)   . Sleep apnea    no CPAP  . Ulcer   . Urinary tract infection    Past Surgical History:  Procedure Laterality Date  . ABDOMINAL HYSTERECTOMY    . ABDOMINAL HYSTERECTOMY    . BACK SURGERY    . BLADDER REPAIR     x2  . CARDIAC CATHETERIZATION  04/2008  . CHOLECYSTECTOMY  2000  . LEFT HEART CATHETERIZATION WITH CORONARY ANGIOGRAM N/A 08/08/2012   Procedure: LEFT HEART CATHETERIZATION WITH CORONARY ANGIOGRAM;  Surgeon: Lorretta Harp,  MD;  Location: [facility] CATH LAB;  Service: Cardiovascular;  Laterality: N/A;  . RIGHT/LEFT HEART CATH AND CORONARY ANGIOGRAPHY N/A 04/30/2018   Procedure: RIGHT/LEFT HEART CATH AND CORONARY ANGIOGRAPHY;  Surgeon: Lorretta Harp, MD;  Location: [place] CV LAB;  Service: Cardiovascular;  Laterality: N/A;  Stephanie Coup ABLATION N/A 07/19/2018   Procedure: Stephanie Coup ABLATION;  Surgeon: Thompson Grayer, MD;  Location: [place] CV LAB;  Service: Cardiovascular;  Laterality: N/A;     Current Meds  Medication Sig  . ALPRAZolam (XANAX) 1 MG tablet Take 0.5 mg by mouth daily as needed for anxiety.   [name] aspirin 81 MG tablet Take 81 mg by mouth daily.   [name] atorvastatin (LIPITOR) 40 MG tablet Take 1 tablet (40 mg total) by mouth daily at 6 PM.  . busPIRone (BUSPAR) 10 MG tablet Take 10 mg by mouth 2 (two) times daily.   . cholecalciferol (VITAMIN D) 1000 UNITS tablet Take 1,000 Units by mouth daily.   [name] gabapentin (NEURONTIN) 100 MG capsule Take 200 mg by mouth at bedtime.  [name] HYDROcodone-acetaminophen (NORCO) 10-325 MG per tablet Take 0.5-1 tablets by mouth every 6 (six) hours as needed for moderate pain or severe pain.   . meclizine (ANTIVERT) 25 MG tablet Take 25 mg by mouth 3 (three) times daily as needed for dizziness.  . metoprolol succinate (TOPROL-XL) 25 MG 24 hr tablet Take 0.5 tablets (12.5 mg total) by mouth daily.  [name] NITROSTAT 0.4 MG SL tablet Place 1 tablet (0.4 mg total) under the tongue every 5 (five) minutes as needed for chest pain.  [name] omeprazole (PRILOSEC) 20 MG capsule TAKE ONE CAPSULE BY MOUTH TWICE A DAY  . sacubitril-valsartan (ENTRESTO) 24-26 MG Take 1 tablet by mouth 2 (two) times daily.  [name] tiZANidine (ZANAFLEX) 4 MG capsule Take 4-8 mg by mouth at bedtime. For muscle pain  . topiramate (TOPAMAX) 25 MG tablet Take 25 mg by mouth at bedtime.     Allergies:   Penicillins, Shellfish-derived products, Sulfa antibiotics, and Morphine and related   Social History   Tobacco Use  . Smoking  status: Never Smoker  . Smokeless tobacco: Never Used  Substance Use Topics  . Alcohol use: No  . Drug use: No     Family Hx: The patient's family history includes Colon cancer in her paternal uncle; Hypertension in her mother. There is no history of Esophageal cancer, Stomach cancer, or Rectal cancer.  ROS:   Please see the history of present illness.    All other systems reviewed and are negative.   Prior CV studies:   The following studies were reviewed today: As above.  Labs/Other Tests and Data Reviewed:    EKG:  No ECG reviewed.  Recent Labs: 04/25/2018: TSH 1.180 07/09/2018: BUN 20; Creatinine, Ser 0.81; Hemoglobin 13.0; Platelets 154; Potassium 3.6; Sodium 143   Recent  Lipid Panel Lab Results  Component Value Date/Time   CHOL  04/20/2008 04:00 AM    139        ATP III CLASSIFICATION:  <200     mg/dL   Desirable  200-239  mg/dL   Borderline High  >=240    mg/dL   High   TRIG 118 04/20/2008 04:00 AM   HDL 37 (L) 04/20/2008 04:00 AM   CHOLHDL 3.8 04/20/2008 04:00 AM   LDLCALC  04/20/2008 04:00 AM    78        Total Cholesterol/HDL:CHD Risk Coronary Heart Disease Risk Table                     Men   Women  1/2 Average Risk   3.4   3.3    Wt Readings from Last 3 Encounters:  02/28/19 210 lb (95.3 kg)  11/23/18 206 lb (93.4 kg)  11/14/18 211 lb 6.4 oz (95.9 kg)     Objective:    Vital Signs:  BP 121/71   Pulse 63   Ht 5\' 5"  (1.651 m)   Wt 210 lb (95.3 kg)   BMI 34.95 kg/m    VITAL SIGNS:  reviewed GEN:  no acute distress NEURO:  alert and oriented x 3, no obvious focal deficit PSYCH:  Anxious, tearful   ASSESSMENT & PLAN:    1.  Chronic chest pain: Cardiac catheterization October 2019 revealed essentially normal coronary arteries with moderate to severe LV dysfunction however repeat echocardiogram revealed improvement in LV function to 55 to 60%.  No changes in her medication regimen.  This may be related to anxiety, versus coronary spasm although  this was not found to be the case during recent catheterization.  Nitroglycerin sometimes is helpful.  If symptoms are very persistent may consider adding low-dose isosorbide.  However will wait until she follows up with [name].  2.  Hypercholesterolemia: Patient will continue statin therapy.  Labs are completed by primary care.  3.  Status post PVC ablation: She is to see [name] in a couple of weeks for ongoing evaluation and management.  She will continue on metoprolol 12.5 mg daily.  4.  Anxiety and panic attacks: She has been referred to psychiatry by PCP.  COVID-19 Education: The signs and symptoms of COVID-19 were discussed with the patient and how to seek care for testing (follow up with PCP or arrange E-visit).  The importance of social distancing was discussed today.  Time:   Today, I have spent 15 minutes with the patient with telehealth technology discussing the above problems.     Medication Adjustments/Labs and Tests Ordered: Current medicines are reviewed at length with the patient today.  Concerns regarding medicines are outlined above.   Tests Ordered: No orders of the defined types were placed in this encounter.   Medication Changes: No orders of the defined types were placed in this encounter.   Disposition:  Follow up 6 months  Signed, Phill Myron. [place] Pugh, ANP, AACC  02/28/2019 11:21 AM    [facility]

## 2019-02-28 NOTE — Patient Instructions (Signed)
Medication Instructions:  Continue current medication  If you need a refill on your cardiac medications before your next appointment, please call your pharmacy.  Labwork: None ordered   Testing/Procedures: None Ordered  Follow-Up: You will need a follow up appointment in 6 months.  Please call our office 2 months in advance to schedule this appointment.  You may see Quay Burow, MD or one of the following Advanced Practice Providers on your designated Care Team:   Kerin Ransom, PA-C Roby Lofts, [place] . Sande Rives, PA-C     At [facility], you and your health needs are our priority.  As part of our continuing mission to provide you with exceptional heart care, we have created designated Provider Care Teams.  These Care Teams include your primary Cardiologist (physician) and Advanced Practice Providers (APPs -  Physician Assistants and Nurse Practitioners) who all work together to provide you with the care you need, when you need it.  Thank you for choosing [facility] at [name]!!

## 2019-03-14 ENCOUNTER — Telehealth: Payer: Self-pay

## 2019-03-14 NOTE — Telephone Encounter (Signed)
Spoke with pt regarding appt on 03/18/19. Pt was advise to check her vitals prior to her appt. Pt questions were address.

## 2019-03-15 ENCOUNTER — Ambulatory Visit: Payer: Medicare Other | Admitting: Physician Assistant

## 2019-03-18 ENCOUNTER — Telehealth (INDEPENDENT_AMBULATORY_CARE_PROVIDER_SITE_OTHER): Payer: Medicare Other | Admitting: Internal Medicine

## 2019-03-18 ENCOUNTER — Other Ambulatory Visit: Payer: Self-pay

## 2019-03-18 VITALS — BP 159/84 | HR 63 | Ht 65.0 in | Wt 214.0 lb

## 2019-03-18 DIAGNOSIS — I428 Other cardiomyopathies: Secondary | ICD-10-CM

## 2019-03-18 DIAGNOSIS — I493 Ventricular premature depolarization: Secondary | ICD-10-CM

## 2019-03-18 DIAGNOSIS — R5383 Other fatigue: Secondary | ICD-10-CM

## 2019-03-18 DIAGNOSIS — G4733 Obstructive sleep apnea (adult) (pediatric): Secondary | ICD-10-CM

## 2019-03-18 NOTE — Progress Notes (Signed)
Electrophysiology TeleHealth Note  Due to national recommendations of social distancing due to [place] 19, an audio telehealth visit is felt to be most appropriate for this patient at this time.  Verbal consent was obtained by me for the telehealth visit today.  The patient does not have capability for a virtual visit.  A phone visit is therefore required today.   Date:  03/18/2019   ID:  [name], DOB [birth year], MRN [id number]  Location: patient's home  Provider location:  [place]  Evaluation Performed: Follow-up visit  PCP:  Secundino Ginger, PA-C   Electrophysiologist:  Dr Rayann Heman  Chief Complaint:  palpitations  History of Present Illness:    [name] is a 68 y.o. female who presents via telehealth conferencing today.  Since last being seen in our clinic, the patient reports doing very well. Her EF has normalized with PVC ablation.   Today, she denies symptoms of palpitations, exertional chest pain, shortness of breath,  lower extremity edema, dizziness, presyncope, or syncope. She has occasional atypical chest pain which she attributes to anxiety related to her grandson. The patient is otherwise without complaint today.   Past Medical History:  Diagnosis Date  . Adenomatous colon polyp 2006  . Arrhythmia    Heart  . Arthritis   . Asthma   . Chronic headaches   . Complication of anesthesia   . Diabetes mellitus type II, controlled ([place])    Diet  . Gastric polyp    hyperplastic  . GERD (gastroesophageal reflux disease)   . Headache   . Hyperlipidemia   . Hypertension   . IBS (irritable bowel syndrome)   . Kidney stones   . Lymphocytic colitis   . Myocardial infarct ([place])   . Neuropathy, peripheral   . PONV (postoperative nausea and vomiting)   . Sleep apnea    no CPAP  . Ulcer   . Urinary tract infection     Past Surgical History:  Procedure Laterality Date  . ABDOMINAL HYSTERECTOMY    . ABDOMINAL HYSTERECTOMY    . BACK SURGERY    .  BLADDER REPAIR     x2  . CARDIAC CATHETERIZATION  04/2008  . CHOLECYSTECTOMY  2000  . LEFT HEART CATHETERIZATION WITH CORONARY ANGIOGRAM N/A 08/08/2012   Procedure: LEFT HEART CATHETERIZATION WITH CORONARY ANGIOGRAM;  Surgeon: Lorretta Harp, MD;  Location: [facility] CATH LAB;  Service: Cardiovascular;  Laterality: N/A;  . RIGHT/LEFT HEART CATH AND CORONARY ANGIOGRAPHY N/A 04/30/2018   Procedure: RIGHT/LEFT HEART CATH AND CORONARY ANGIOGRAPHY;  Surgeon: Lorretta Harp, MD;  Location: [facility] CV LAB;  Service: Cardiovascular;  Laterality: N/A;  Stephanie Coup ABLATION N/A 07/19/2018   Procedure: Stephanie Coup ABLATION;  Surgeon: Thompson Grayer, MD;  Location: [facility] LAB;  Service: Cardiovascular;  Laterality: N/A;    Current Outpatient Medications  Medication Sig Dispense Refill  . ALPRAZolam (XANAX) 1 MG tablet Take 0.5 mg by mouth daily as needed for anxiety.     [name] aspirin 81 MG tablet Take 81 mg by mouth daily.     [name] atorvastatin (LIPITOR) 40 MG tablet Take 1 tablet (40 mg total) by mouth daily at 6 PM. 90 tablet 3  . busPIRone (BUSPAR) 10 MG tablet Take 10 mg by mouth 2 (two) times daily.     . cholecalciferol (VITAMIN D) 1000 UNITS tablet Take 1,000 Units by mouth daily.     [name] gabapentin (NEURONTIN) 100 MG capsule Take 200  mg by mouth at bedtime.    [name] HYDROcodone-acetaminophen (NORCO) 10-325 MG per tablet Take 0.5-1 tablets by mouth every 6 (six) hours as needed for moderate pain or severe pain.     . meclizine (ANTIVERT) 25 MG tablet Take 25 mg by mouth 3 (three) times daily as needed for dizziness.    . metoprolol succinate (TOPROL-XL) 25 MG 24 hr tablet Take 0.5 tablets (12.5 mg total) by mouth daily. 45 tablet 3  . NITROSTAT 0.4 MG SL tablet Place 1 tablet (0.4 mg total) under the tongue every 5 (five) minutes as needed for chest pain. 28 tablet 0  . omeprazole (PRILOSEC) 20 MG capsule TAKE ONE CAPSULE BY MOUTH TWICE A DAY 60 capsule 3  . sacubitril-valsartan (ENTRESTO) 24-26 MG Take 1  tablet by mouth 2 (two) times daily. 60 tablet 11  . tiZANidine (ZANAFLEX) 4 MG capsule Take 4-8 mg by mouth at bedtime. For muscle pain    . topiramate (TOPAMAX) 25 MG tablet Take 25 mg by mouth at bedtime.     No current facility-administered medications for this visit.     Allergies:   Penicillins, Shellfish-derived products, Sulfa antibiotics, and Morphine and related   Social History:  The patient  reports that she has never smoked. She has never used smokeless tobacco. She reports that she does not drink alcohol or use drugs.   Family History:  The patient's family history includes Colon cancer in her paternal uncle; Hypertension in her mother.   ROS:  Please see the history of present illness.   All other systems are personally reviewed and negative.    Exam:    Vital Signs:  BP (!) 159/84   Pulse 63   Ht 5\' 5"  (1.651 m)   Wt 214 lb (97.1 kg)   BMI 35.61 kg/m   Well sounding, alert and conversant   Labs/Other Tests and Data Reviewed:    Recent Labs: 04/25/2018: TSH 1.180 07/09/2018: BUN 20; Creatinine, Ser 0.81; Hemoglobin 13.0; Platelets 154; Potassium 3.6; Sodium 143   Wt Readings from Last 3 Encounters:  03/18/19 214 lb (97.1 kg)  02/28/19 210 lb (95.3 kg)  11/23/18 206 lb (93.4 kg)       ASSESSMENT & PLAN:    1.  PVCs Doing well post ablation She has some fatigue, but feels that this is due to lack of sleep  I have offered that she stop metoprolol but she wishes to continue this medicine.  2. Nonischemic CM Resolved with PVC ablation  3. HTN Elevated She has follow-up with Roque Cash today  4. CAD Followed by Dr Gwenlyn Found  5. Fatigue Poor sleep pattern is likely the cause She has some degree of sleep apnea.   Could consider stopping metoprolol She will discuss with Roque Cash   Follow-up:  Return as needed to see me   Patient Risk:  after full review of this patients clinical status, I feel that they are at moderate risk at this time.  Today,  I have spent 15 minutes with the patient with telehealth technology discussing arrhythmia management .    Army Fossa, MD  03/18/2019 12:38 PM     [address] [place] [address] [phone] (office) [phone] (fax)

## 2019-06-21 ENCOUNTER — Telehealth: Payer: Self-pay | Admitting: *Deleted

## 2019-06-21 DIAGNOSIS — I519 Heart disease, unspecified: Secondary | ICD-10-CM

## 2019-06-21 DIAGNOSIS — I10 Essential (primary) hypertension: Secondary | ICD-10-CM

## 2019-06-21 DIAGNOSIS — E7800 Pure hypercholesterolemia, unspecified: Secondary | ICD-10-CM

## 2019-06-21 DIAGNOSIS — I428 Other cardiomyopathies: Secondary | ICD-10-CM

## 2019-06-21 NOTE — Telephone Encounter (Signed)
Samples at the front desk for pt p/u  Pt notified

## 2019-06-21 NOTE — Telephone Encounter (Signed)
Received a voicemail from pt that she didn't have the money to get her Delene Loll, that her insurance want cover it until January 1.   Please call pt [phone]

## 2019-06-25 ENCOUNTER — Telehealth: Payer: Self-pay | Admitting: *Deleted

## 2019-06-25 NOTE — Telephone Encounter (Signed)
The patient had picked up a sample of Entresto 24-26 mg twice daily. She brought the sample back stating that she was on Entresto 49-51 mg twice daily. She has been advised that there was not any record of the increase to the 49-51 mg. She stated that [place], [place] had increased the [place].   She has been advised that there is no record of this and she should call that office to see if they have any samples for her. She verbalized her understanding.

## 2019-08-27 ENCOUNTER — Ambulatory Visit: Payer: Medicare HMO | Admitting: Cardiovascular Disease

## 2019-08-27 ENCOUNTER — Encounter: Payer: Self-pay | Admitting: Cardiovascular Disease

## 2019-08-27 ENCOUNTER — Other Ambulatory Visit: Payer: Self-pay

## 2019-08-27 VITALS — BP 112/80 | HR 78 | Temp 97.6°F | Ht 65.0 in | Wt 213.0 lb

## 2019-08-27 DIAGNOSIS — I519 Heart disease, unspecified: Secondary | ICD-10-CM | POA: Diagnosis not present

## 2019-08-27 DIAGNOSIS — E785 Hyperlipidemia, unspecified: Secondary | ICD-10-CM

## 2019-08-27 DIAGNOSIS — I493 Ventricular premature depolarization: Secondary | ICD-10-CM

## 2019-08-27 DIAGNOSIS — I10 Essential (primary) hypertension: Secondary | ICD-10-CM

## 2019-08-27 NOTE — Assessment & Plan Note (Signed)
Status post PVC ablation by [name] 07/19/2018 with complete resolution of her PVCs.  She feels clinically improved since that time.  Her follow-up 2D echo performed 02/19/2019 revealed complete normalization of her LV function up to 55 to 60%.

## 2019-08-27 NOTE — Assessment & Plan Note (Signed)
Status post cardiac catheterization by myself 04/22/2018 revealing normal coronary arteries with severe LV dysfunction, her EF was 35 to 40%.  She was begun on Entresto and low-dose beta-blocker.  She denies chest pain or shortness of breath.

## 2019-08-27 NOTE — Progress Notes (Signed)
08/27/2019 [name]   1951-01-17  [id number]  Primary Physician Secundino Ginger, PA-C Primary Cardiologist: Lorretta Harp MD FACP, [place], [place], [place]  HPI:  [name] is a 69 y.o.  severely overweight married Caucasian female mother of 3 children (1 deceased) 2023/02/17 last saw in the office  05/29/2018. Unfortunately, her husband of 40 years passed away in 2015-10-18 she is still grieving. Shewasalso taking care of her elderly motherhas since passed away11/18. Her sister also moved in with her. I last saw her in the office 03/17/2017.[name] Her primary care provider is Roque Cash. She has a history of hypertension, hyperlipidemia, and diabetes. She has never had a heart attack or stroke. She does have sleep apnea. She has had 2 normal cardiac catheterizations in 10-18-2007 and 14 respectively. She does get occasional atypical chest pain.Since I saw her year ago she's remained stable with occasional episodes of chest pain Since I saw her a year ago she is remained stable. She has episodic atypical chest pain as well. She is also recently complained of palpitations which I suspect her PVCs. She is under a lot of stress and she has high anxiety. She had a 2D echo performed 04/23/2018 revealing a significant decline in her LVEF to 20 to 25% compared to her previous echo performed 10/21/2014 which was essentially normal. She does complain of dyspnea on exertion and atypical chest pain.  I performed cardiac catheterization on her 04/30/2018 revealing essentially normal coronary arteries with moderately severe LV dysfunction and EF of 35 to 40%.  She is currently on low-dose Entresto and low-dose beta-blocker.  She had a PVC ablation performed by [name] 07/19/2018 resulting in complete resolution of her PVCs, marked clinical improvement as well as normalization of her ejection fraction by 2D echo from 35 to 40% up to 55 to 60%.   Current Meds  Medication Sig  . ALPRAZolam (XANAX) 1 MG  tablet Take 0.5 mg by mouth daily as needed for anxiety.   [name] aspirin 81 MG tablet Take 81 mg by mouth daily.   [name] atorvastatin (LIPITOR) 40 MG tablet Take 1 tablet (40 mg total) by mouth daily at 6 PM.  . busPIRone (BUSPAR) 10 MG tablet Take 10 mg by mouth 2 (two) times daily.   . cholecalciferol (VITAMIN D) 1000 UNITS tablet Take 2,000 Units by mouth daily.   [name] gabapentin (NEURONTIN) 100 MG capsule Take 200 mg by mouth at bedtime.  [name] HYDROcodone-acetaminophen (NORCO) 10-325 MG per tablet Take 0.5-1 tablets by mouth every 6 (six) hours as needed for moderate pain or severe pain.   . meclizine (ANTIVERT) 25 MG tablet Take 25 mg by mouth 3 (three) times daily as needed for dizziness.  . metoprolol succinate (TOPROL-XL) 25 MG 24 hr tablet Take 0.5 tablets (12.5 mg total) by mouth daily.  [name] NITROSTAT 0.4 MG SL tablet Place 1 tablet (0.4 mg total) under the tongue every 5 (five) minutes as needed for chest pain.  [name] omeprazole (PRILOSEC) 20 MG capsule TAKE ONE CAPSULE BY MOUTH TWICE A DAY  . sacubitril-valsartan (ENTRESTO) 24-26 MG Take 1 tablet by mouth 2 (two) times daily.  [name] tiZANidine (ZANAFLEX) 4 MG capsule Take 4-8 mg by mouth at bedtime. For muscle pain  . topiramate (TOPAMAX) 25 MG tablet Take 25 mg by mouth at bedtime.  . Turmeric 500 MG CAPS Take 500 mg by mouth daily.  . vitamin E (VITAMIN E) 180 MG (400 UNITS) capsule Take 400  Units by mouth daily.     Allergies  Allergen Reactions  . Penicillins Anaphylaxis and Swelling    Has patient had a PCN reaction causing immediate rash, facial/tongue/throat swelling, SOB or lightheadedness with hypotension: Yes Has patient had a PCN reaction causing severe rash involving mucus membranes or skin necrosis: No Has patient had a PCN reaction that required hospitalization: Yes Has patient had a PCN reaction occurring within the last 10 years: No    . Shellfish-Derived Products Anaphylaxis and Swelling  . Sulfa Antibiotics Anaphylaxis and Swelling   . Morphine And Related Nausea And Vomiting    Social History   Socioeconomic History  . Marital status: Widowed    Spouse name: Not on file  . Number of children: 3  . Years of education: Not on file  . Highest education level: Not on file  Occupational History  . Occupation: Disabled  Tobacco Use  . Smoking status: Never Smoker  . Smokeless tobacco: Never Used  Substance and Sexual Activity  . Alcohol use: No  . Drug use: No  . Sexual activity: Yes    Birth control/protection: Surgical  Other Topics Concern  . Not on file  Social History Narrative  . Not on file   Social Determinants of Health   Financial Resource Strain:   . Difficulty of Paying Living Expenses: Not on file  Food Insecurity:   . Worried About Charity fundraiser in the Last Year: Not on file  . Ran Out of Food in the Last Year: Not on file  Transportation Needs:   . Lack of Transportation (Medical): Not on file  . Lack of Transportation (Non-Medical): Not on file  Physical Activity:   . Days of Exercise per Week: Not on file  . Minutes of Exercise per Session: Not on file  Stress:   . Feeling of Stress : Not on file  Social Connections:   . Frequency of Communication with Friends and Family: Not on file  . Frequency of Social Gatherings with Friends and Family: Not on file  . Attends Religious Services: Not on file  . Active Member of Clubs or Organizations: Not on file  . Attends Archivist Meetings: Not on file  . Marital Status: Not on file  Intimate Partner Violence:   . Fear of Current or Ex-Partner: Not on file  . Emotionally Abused: Not on file  . Physically Abused: Not on file  . Sexually Abused: Not on file     Review of Systems: General: negative for chills, fever, night sweats or weight changes.  Cardiovascular: negative for chest pain, dyspnea on exertion, edema, orthopnea, palpitations, paroxysmal nocturnal dyspnea or shortness of breath Dermatological: negative for  rash Respiratory: negative for cough or wheezing Urologic: negative for hematuria Abdominal: negative for nausea, vomiting, diarrhea, bright red blood per rectum, melena, or hematemesis Neurologic: negative for visual changes, syncope, or dizziness All other systems reviewed and are otherwise negative except as noted above.    Blood pressure 112/80, pulse 78, temperature 97.6 F (36.4 C), height 5\' 5"  (1.651 m), weight 213 lb (96.6 kg).  General appearance: alert and no distress Neck: no adenopathy, no carotid bruit, no JVD, supple, symmetrical, trachea midline and thyroid not enlarged, symmetric, no tenderness/mass/nodules Lungs: clear to auscultation bilaterally Heart: regular rate and rhythm, S1, S2 normal, no murmur, click, rub or gallop Extremities: extremities normal, atraumatic, no cyanosis or edema Pulses: 2+ and symmetric Skin: Skin color, texture, turgor normal. No rashes or lesions  Neurologic: Alert and oriented X 3, normal strength and tone. Normal symmetric reflexes. Normal coordination and gait  EKG sinus rhythm at 78 without ST or T wave changes.  I personally reviewed this EKG.  ASSESSMENT AND PLAN:   Normal coronary arteries- Status post cardiac catheterization by myself 04/22/2018 revealing normal coronary arteries with severe LV dysfunction, her EF was 35 to 40%.  She was begun on Entresto and low-dose beta-blocker.  She denies chest pain or shortness of breath.  HTN (hypertension) History of essential hypertension with blood pressure measured today 112/80 is on metoprolol and Entresto.  PVC's (premature ventricular contractions) Status post PVC ablation by [name] 07/19/2018 with complete resolution of her PVCs.  She feels clinically improved since that time.  Her follow-up 2D echo performed 02/19/2019 revealed complete normalization of her LV function up to 55 to 60%.  Dyslipidemia History of hyperlipidemia on statin therapy followed by her  PCP.      Lorretta Harp MD FACP,FACC,FAHA, [name] 08/27/2019 4:19 PM

## 2019-08-27 NOTE — Assessment & Plan Note (Signed)
History of hyperlipidemia on statin therapy followed by her PCP. 

## 2019-08-27 NOTE — Assessment & Plan Note (Signed)
History of essential hypertension with blood pressure measured today 112/80 is on metoprolol and Entresto.

## 2019-08-27 NOTE — Patient Instructions (Signed)
Medication Instructions:  Your physician recommends that you continue on your current medications as directed. Please refer to the Current Medication list given to you today.  If you need a refill on your cardiac medications before your next appointment, please call your pharmacy.   Lab work: NONE  Testing/Procedures: NONE  Follow-Up: At [facility], you and your health needs are our priority.  As part of our continuing mission to provide you with exceptional heart care, we have created designated Provider Care Teams.  These Care Teams include your primary Cardiologist (physician) and Advanced Practice Providers (APPs -  Physician Assistants and Nurse Practitioners) who all work together to provide you with the care you need, when you need it. You may see Jonathan Berry, MD or one of the following Advanced Practice Providers on your designated Care Team:    Luke Kilroy, PA-C  Callie Goodrich, PA-C  Jesse Cleaver, FNP  Your physician wants you to follow-up in: 1 year with Dr. Berry You will receive a reminder letter in the mail two months in advance. If you don't receive a letter, please call our office to schedule the follow-up appointment.       

## 2020-06-13 ENCOUNTER — Other Ambulatory Visit: Payer: Self-pay | Admitting: Cardiovascular Disease

## 2020-06-17 ENCOUNTER — Telehealth: Payer: Self-pay | Admitting: Cardiovascular Disease

## 2020-06-17 NOTE — Telephone Encounter (Signed)
Spoke with the patient to offer her Entresto 30-day free card. Confirmed with pt that she has never used the 30-day free offer before. Instructed pt to tell her pharmacy not to run the offer through her insurance. Advised pt we will leave card for her at check-in desk. Pt verbalizes understanding and gratitude.

## 2020-06-17 NOTE — Telephone Encounter (Signed)
    Patient calling the office for samples of medication:   1.  What medication and dosage are you requesting samples for?sacubitril-valsartan (ENTRESTO) 24-26 MG  2.  Are you currently out of this medication? Only have a week left  Pt said her insurance wont cover her entresto until the beginning of the month next year

## 2020-07-30 NOTE — Progress Notes (Deleted)
Cardiology Office Note   Date:  07/30/2020   ID:  [name], DOB 09-08-1950, MRN [id number]  PCP:  Secundino Ginger, PA-C  Cardiologist:Jonathan Adora Fridge MD FACP, [facility], [place], [place] No chief complaint on file.    History of Present Illness: [name] is a 70 y.o. female who presents for ongoing assessment and management of hypertension, hyperlipidemia, with history of diabetes, and OSA.  She had 2 normal cardiac catheterizations in 2019 and 2014 respectively.  She did have occasional noncardiac chest pain.  Most recent 2D echo performed in October 2019 revealed a significant decline in her LVEF to 20 to 25% compared to previous echo performed in 2016 which was normal.  [name] performed cardiac catheterization on 04/2018 revealing essentially normal coronary arteries with moderately severe LV dysfunction with an EF of 35 to 40%.  She was placed on low-dose Entresto and low-dose beta-blocker.  Was also noted that she had a PVC ablation performed by [name] on 07/19/2018 resulting in complete resolution of her PVCs, with marked clinical improvement as well as normalization of her EF by 2D echo which revealed an LVEF of 35 to 40% up to 55 to 60%.  On last visit with [name] the patient was doing well tolerant of Entresto and low-dose beta-blocker and denied any chest pain at that time.  Blood pressure was well controlled at 112/80 on medication regimen.  She offers no complaints of palpitations.  Her hyperlipidemia was followed by her PCP.    Past Medical History:  Diagnosis Date  . Adenomatous colon polyp 2006  . Arrhythmia    Heart  . Arthritis   . Asthma   . Chronic headaches   . Complication of anesthesia   . Diabetes mellitus type II, controlled ([place])    Diet  . Gastric polyp    hyperplastic  . GERD (gastroesophageal reflux disease)   . Headache   . Hyperlipidemia   . Hypertension   . IBS (irritable bowel syndrome)   . Kidney stones   . Lymphocytic colitis   .  Myocardial infarct ([place])   . Neuropathy, peripheral   . PONV (postoperative nausea and vomiting)   . Sleep apnea    no CPAP  . Ulcer   . Urinary tract infection     Past Surgical History:  Procedure Laterality Date  . ABDOMINAL HYSTERECTOMY    . ABDOMINAL HYSTERECTOMY    . BACK SURGERY    . BLADDER REPAIR     x2  . CARDIAC CATHETERIZATION  04/2008  . CHOLECYSTECTOMY  2000  . LEFT HEART CATHETERIZATION WITH CORONARY ANGIOGRAM N/A 08/08/2012   Procedure: LEFT HEART CATHETERIZATION WITH CORONARY ANGIOGRAM;  Surgeon: Lorretta Harp, MD;  Location: [facility] CATH LAB;  Service: Cardiovascular;  Laterality: N/A;  . RIGHT/LEFT HEART CATH AND CORONARY ANGIOGRAPHY N/A 04/30/2018   Procedure: RIGHT/LEFT HEART CATH AND CORONARY ANGIOGRAPHY;  Surgeon: Lorretta Harp, MD;  Location: [place] CV LAB;  Service: Cardiovascular;  Laterality: N/A;  Stephanie Coup ABLATION N/A 07/19/2018   Procedure: Stephanie Coup ABLATION;  Surgeon: Thompson Grayer, MD;  Location: [place] CV LAB;  Service: Cardiovascular;  Laterality: N/A;     Current Outpatient Medications  Medication Sig Dispense Refill  . ALPRAZolam (XANAX) 1 MG tablet Take 0.5 mg by mouth daily as needed for anxiety.     [name] aspirin 81 MG tablet Take 81 mg by mouth daily.     [name] atorvastatin (LIPITOR) 40 MG tablet Take  1 tablet (40 mg total) by mouth daily at 6 PM. 90 tablet 3  . busPIRone (BUSPAR) 10 MG tablet Take 10 mg by mouth 2 (two) times daily.     . cholecalciferol (VITAMIN D) 1000 UNITS tablet Take 2,000 Units by mouth daily.     [name] gabapentin (NEURONTIN) 100 MG capsule Take 200 mg by mouth at bedtime.    [name] HYDROcodone-acetaminophen (NORCO) 10-325 MG per tablet Take 0.5-1 tablets by mouth every 6 (six) hours as needed for moderate pain or severe pain.     . meclizine (ANTIVERT) 25 MG tablet Take 25 mg by mouth 3 (three) times daily as needed for dizziness.    . metoprolol succinate (TOPROL-XL) 25 MG 24 hr tablet Take 0.5 tablets (12.5 mg total) by  mouth daily. 45 tablet 3  . nitroGLYCERIN (NITROSTAT) 0.4 MG SL tablet Place 1 tablet (0.4 mg total) under the tongue every 5 (five) minutes as needed for chest pain. 25 tablet 0  . omeprazole (PRILOSEC) 20 MG capsule TAKE ONE CAPSULE BY MOUTH TWICE A DAY 60 capsule 3  . sacubitril-valsartan (ENTRESTO) 24-26 MG Take 1 tablet by mouth 2 (two) times daily. 60 tablet 11  . tiZANidine (ZANAFLEX) 4 MG capsule Take 4-8 mg by mouth at bedtime. For muscle pain    . topiramate (TOPAMAX) 25 MG tablet Take 25 mg by mouth at bedtime.    . Turmeric 500 MG CAPS Take 500 mg by mouth daily.    . vitamin E (VITAMIN E) 180 MG (400 UNITS) capsule Take 400 Units by mouth daily.     No current facility-administered medications for this visit.    Allergies:   Penicillins, Shellfish-derived products, Sulfa antibiotics, and Morphine and related    Social History:  The patient  reports that she has never smoked. She has never used smokeless tobacco. She reports that she does not drink alcohol and does not use drugs.   Family History:  The patient's family history includes Colon cancer in her paternal uncle; Hypertension in her mother.    ROS: All other systems are reviewed and negative. Unless otherwise mentioned in H&P    PHYSICAL EXAM: VS:  There were no vitals taken for this visit. , BMI There is no height or weight on file to calculate BMI. GEN: Well nourished, well developed, in no acute distress HEENT: normal Neck: no JVD, carotid bruits, or masses Cardiac: ***RRR; no murmurs, rubs, or gallops,no edema  Respiratory:  Clear to auscultation bilaterally, normal work of breathing GI: soft, nontender, nondistended, + BS MS: no deformity or atrophy Skin: warm and dry, no rash Neuro:  Strength and sensation are intact Psych: euthymic mood, full affect   EKG:  EKG {ACTION; IS/IS [id number]} ordered today. The ekg ordered today demonstrates ***   Recent Labs: No results Daniel for requested labs  within last 8760 hours.    Lipid Panel    Component Value Date/Time   CHOL  04/20/2008 0400    139        ATP III CLASSIFICATION:  <200     mg/dL   Desirable  200-239  mg/dL   Borderline High  >=240    mg/dL   High   TRIG 118 04/20/2008 0400   HDL 37 (L) 04/20/2008 0400   CHOLHDL 3.8 04/20/2008 0400   VLDL 24 04/20/2008 0400   LDLCALC  04/20/2008 0400    78        Total Cholesterol/HDL:CHD Risk Coronary Heart Disease Risk Table  Men   Women  1/2 Average Risk   3.4   3.3      Wt Readings from Last 3 Encounters:  08/27/19 213 lb (96.6 kg)  03/18/19 214 lb (97.1 kg)  02/28/19 210 lb (95.3 kg)      Other studies Reviewed: Cardiac Cath 04/30/2018    Mid RCA lesion is 20% stenosed.  Prox LAD lesion is 30% stenosed.  There is moderate left ventricular systolic dysfunction.  LV end diastolic pressure is normal.  The left ventricular ejection fraction is 35-45% by visual estimate.     ASSESSMENT AND PLAN:  1.  ***   Current medicines are reviewed at length with the patient today.  I have spent *** dedicated to the care of this patient on the date of this encounter to include pre-visit review of records, assessment, management and diagnostic testing,with shared decision making.  Labs/ tests ordered today include: *** Phill Myron. West Pugh, ANP, AACC   07/30/2020 6:58 PM    [name] [address] Office [phone] Fax [phone]  Notice: This dictation was prepared with Dragon dictation along with smaller phrase technology. Any transcriptional errors that result from this process are unintentional and may not be corrected upon review.

## 2020-07-31 ENCOUNTER — Encounter (HOSPITAL_COMMUNITY): Payer: Self-pay | Admitting: Internal Medicine

## 2020-07-31 ENCOUNTER — Ambulatory Visit: Payer: Medicare HMO | Admitting: Adult Health

## 2020-07-31 ENCOUNTER — Emergency Department (HOSPITAL_COMMUNITY): Payer: Medicare HMO

## 2020-07-31 ENCOUNTER — Other Ambulatory Visit: Payer: Self-pay

## 2020-07-31 ENCOUNTER — Inpatient Hospital Stay (HOSPITAL_COMMUNITY)
Admission: EM | Admit: 2020-07-31 | Discharge: 2020-09-23 | DRG: 177 | Disposition: A | Discharge status: Other Acute Inpatient Hospital | Payer: Medicare HMO | Attending: Internal Medicine | Admitting: Internal Medicine

## 2020-07-31 DIAGNOSIS — Z87892 Personal history of anaphylaxis: Secondary | ICD-10-CM

## 2020-07-31 DIAGNOSIS — Z6836 Body mass index (BMI) 36.0-36.9, adult: Secondary | ICD-10-CM

## 2020-07-31 DIAGNOSIS — M79605 Pain in left leg: Secondary | ICD-10-CM | POA: Diagnosis not present

## 2020-07-31 DIAGNOSIS — Z09 Encounter for follow-up examination after completed treatment for conditions other than malignant neoplasm: Secondary | ICD-10-CM

## 2020-07-31 DIAGNOSIS — J9601 Acute respiratory failure with hypoxia: Secondary | ICD-10-CM | POA: Diagnosis present

## 2020-07-31 DIAGNOSIS — K219 Gastro-esophageal reflux disease without esophagitis: Secondary | ICD-10-CM | POA: Diagnosis present

## 2020-07-31 DIAGNOSIS — J9690 Respiratory failure, unspecified, unspecified whether with hypoxia or hypercapnia: Secondary | ICD-10-CM | POA: Diagnosis not present

## 2020-07-31 DIAGNOSIS — I5042 Chronic combined systolic (congestive) and diastolic (congestive) heart failure: Secondary | ICD-10-CM | POA: Diagnosis present

## 2020-07-31 DIAGNOSIS — D849 Immunodeficiency, unspecified: Secondary | ICD-10-CM | POA: Diagnosis not present

## 2020-07-31 DIAGNOSIS — R7989 Other specified abnormal findings of blood chemistry: Secondary | ICD-10-CM | POA: Diagnosis not present

## 2020-07-31 DIAGNOSIS — R42 Dizziness and giddiness: Secondary | ICD-10-CM | POA: Diagnosis present

## 2020-07-31 DIAGNOSIS — J1282 Pneumonia due to coronavirus disease 2019: Secondary | ICD-10-CM | POA: Diagnosis not present

## 2020-07-31 DIAGNOSIS — U071 COVID-19: Secondary | ICD-10-CM | POA: Diagnosis not present

## 2020-07-31 DIAGNOSIS — E860 Dehydration: Secondary | ICD-10-CM | POA: Diagnosis not present

## 2020-07-31 DIAGNOSIS — Z880 Allergy status to penicillin: Secondary | ICD-10-CM

## 2020-07-31 DIAGNOSIS — R0602 Shortness of breath: Secondary | ICD-10-CM | POA: Diagnosis not present

## 2020-07-31 DIAGNOSIS — Z7982 Long term (current) use of aspirin: Secondary | ICD-10-CM

## 2020-07-31 DIAGNOSIS — R1310 Dysphagia, unspecified: Secondary | ICD-10-CM | POA: Diagnosis present

## 2020-07-31 DIAGNOSIS — Z882 Allergy status to sulfonamides status: Secondary | ICD-10-CM

## 2020-07-31 DIAGNOSIS — I2510 Atherosclerotic heart disease of native coronary artery without angina pectoris: Secondary | ICD-10-CM | POA: Diagnosis present

## 2020-07-31 DIAGNOSIS — Z515 Encounter for palliative care: Secondary | ICD-10-CM | POA: Diagnosis not present

## 2020-07-31 DIAGNOSIS — E1142 Type 2 diabetes mellitus with diabetic polyneuropathy: Secondary | ICD-10-CM | POA: Diagnosis present

## 2020-07-31 DIAGNOSIS — E669 Obesity, unspecified: Secondary | ICD-10-CM | POA: Diagnosis not present

## 2020-07-31 DIAGNOSIS — R918 Other nonspecific abnormal finding of lung field: Secondary | ICD-10-CM | POA: Diagnosis not present

## 2020-07-31 DIAGNOSIS — J9621 Acute and chronic respiratory failure with hypoxia: Secondary | ICD-10-CM | POA: Diagnosis not present

## 2020-07-31 DIAGNOSIS — I82462 Acute embolism and thrombosis of left calf muscular vein: Secondary | ICD-10-CM | POA: Diagnosis not present

## 2020-07-31 DIAGNOSIS — I5032 Chronic diastolic (congestive) heart failure: Secondary | ICD-10-CM | POA: Diagnosis not present

## 2020-07-31 DIAGNOSIS — G4733 Obstructive sleep apnea (adult) (pediatric): Secondary | ICD-10-CM | POA: Diagnosis present

## 2020-07-31 DIAGNOSIS — T7401XA Adult neglect or abandonment, confirmed, initial encounter: Secondary | ICD-10-CM

## 2020-07-31 DIAGNOSIS — Z22322 Carrier or suspected carrier of Methicillin resistant Staphylococcus aureus: Secondary | ICD-10-CM

## 2020-07-31 DIAGNOSIS — I159 Secondary hypertension, unspecified: Secondary | ICD-10-CM

## 2020-07-31 DIAGNOSIS — J470 Bronchiectasis with acute lower respiratory infection: Secondary | ICD-10-CM | POA: Diagnosis present

## 2020-07-31 DIAGNOSIS — A419 Sepsis, unspecified organism: Secondary | ICD-10-CM

## 2020-07-31 DIAGNOSIS — Z90710 Acquired absence of both cervix and uterus: Secondary | ICD-10-CM

## 2020-07-31 DIAGNOSIS — I110 Hypertensive heart disease with heart failure: Secondary | ICD-10-CM | POA: Diagnosis present

## 2020-07-31 DIAGNOSIS — M1990 Unspecified osteoarthritis, unspecified site: Secondary | ICD-10-CM | POA: Diagnosis present

## 2020-07-31 DIAGNOSIS — D6859 Other primary thrombophilia: Secondary | ICD-10-CM | POA: Diagnosis not present

## 2020-07-31 DIAGNOSIS — Z86718 Personal history of other venous thrombosis and embolism: Secondary | ICD-10-CM

## 2020-07-31 DIAGNOSIS — Z885 Allergy status to narcotic agent status: Secondary | ICD-10-CM

## 2020-07-31 DIAGNOSIS — K5900 Constipation, unspecified: Secondary | ICD-10-CM | POA: Diagnosis not present

## 2020-07-31 DIAGNOSIS — R6520 Severe sepsis without septic shock: Secondary | ICD-10-CM | POA: Diagnosis not present

## 2020-07-31 DIAGNOSIS — Z87442 Personal history of urinary calculi: Secondary | ICD-10-CM

## 2020-07-31 DIAGNOSIS — I700 Atherosclerosis of aorta: Secondary | ICD-10-CM | POA: Diagnosis present

## 2020-07-31 DIAGNOSIS — E785 Hyperlipidemia, unspecified: Secondary | ICD-10-CM | POA: Diagnosis present

## 2020-07-31 DIAGNOSIS — R339 Retention of urine, unspecified: Secondary | ICD-10-CM | POA: Diagnosis not present

## 2020-07-31 DIAGNOSIS — T7411XA Adult physical abuse, confirmed, initial encounter: Secondary | ICD-10-CM | POA: Diagnosis not present

## 2020-07-31 DIAGNOSIS — I428 Other cardiomyopathies: Secondary | ICD-10-CM

## 2020-07-31 DIAGNOSIS — F411 Generalized anxiety disorder: Secondary | ICD-10-CM | POA: Diagnosis present

## 2020-07-31 DIAGNOSIS — X58XXXA Exposure to other specified factors, initial encounter: Secondary | ICD-10-CM | POA: Diagnosis present

## 2020-07-31 DIAGNOSIS — Z7951 Long term (current) use of inhaled steroids: Secondary | ICD-10-CM

## 2020-07-31 DIAGNOSIS — Z79899 Other long term (current) drug therapy: Secondary | ICD-10-CM

## 2020-07-31 DIAGNOSIS — Z8249 Family history of ischemic heart disease and other diseases of the circulatory system: Secondary | ICD-10-CM

## 2020-07-31 DIAGNOSIS — B957 Other staphylococcus as the cause of diseases classified elsewhere: Secondary | ICD-10-CM | POA: Diagnosis not present

## 2020-07-31 DIAGNOSIS — E1169 Type 2 diabetes mellitus with other specified complication: Secondary | ICD-10-CM | POA: Diagnosis present

## 2020-07-31 DIAGNOSIS — Z7189 Other specified counseling: Secondary | ICD-10-CM | POA: Diagnosis not present

## 2020-07-31 DIAGNOSIS — I10 Essential (primary) hypertension: Secondary | ICD-10-CM | POA: Diagnosis not present

## 2020-07-31 DIAGNOSIS — T148XXA Other injury of unspecified body region, initial encounter: Secondary | ICD-10-CM

## 2020-07-31 DIAGNOSIS — R7881 Bacteremia: Secondary | ICD-10-CM | POA: Diagnosis not present

## 2020-07-31 DIAGNOSIS — B370 Candidal stomatitis: Secondary | ICD-10-CM | POA: Diagnosis not present

## 2020-07-31 DIAGNOSIS — E876 Hypokalemia: Secondary | ICD-10-CM | POA: Diagnosis not present

## 2020-07-31 DIAGNOSIS — S52572A Other intraarticular fracture of lower end of left radius, initial encounter for closed fracture: Secondary | ICD-10-CM | POA: Diagnosis present

## 2020-07-31 DIAGNOSIS — I252 Old myocardial infarction: Secondary | ICD-10-CM

## 2020-07-31 DIAGNOSIS — Z87440 Personal history of urinary (tract) infections: Secondary | ICD-10-CM

## 2020-07-31 DIAGNOSIS — Z91013 Allergy to seafood: Secondary | ICD-10-CM

## 2020-07-31 LAB — CBC WITH DIFFERENTIAL/PLATELET
Abs Immature Granulocytes: 0.29 10*3/uL — ABNORMAL HIGH (ref 0.00–0.07)
Abs Immature Granulocytes: 0.32 10*3/uL — ABNORMAL HIGH (ref 0.00–0.07)
Basophils Absolute: 0 10*3/uL (ref 0.0–0.1)
Basophils Absolute: 0 10*3/uL (ref 0.0–0.1)
Basophils Relative: 0 %
Basophils Relative: 0 %
Eosinophils Absolute: 0 10*3/uL (ref 0.0–0.5)
Eosinophils Absolute: 0 10*3/uL (ref 0.0–0.5)
Eosinophils Relative: 0 %
Eosinophils Relative: 0 %
HCT: 40.8 % (ref 36.0–46.0)
HCT: 40.4 % (ref 36.0–46.0)
Hemoglobin: 13.8 g/dL (ref 12.0–15.0)
Hemoglobin: 13.7 g/dL (ref 12.0–15.0)
Immature Granulocytes: 4 %
Immature Granulocytes: 4 %
Lymphocytes Relative: 11 %
Lymphocytes Relative: 11 %
Lymphs Abs: 0.8 10*3/uL (ref 0.7–4.0)
Lymphs Abs: 0.8 10*3/uL (ref 0.7–4.0)
MCH: 28 pg (ref 26.0–34.0)
MCH: 28.2 pg (ref 26.0–34.0)
MCHC: 33.8 g/dL (ref 30.0–36.0)
MCHC: 33.9 g/dL (ref 30.0–36.0)
MCV: 82.8 fL (ref 80.0–100.0)
MCV: 83.1 fL (ref 80.0–100.0)
Monocytes Absolute: 0.4 10*3/uL (ref 0.1–1.0)
Monocytes Absolute: 0.3 10*3/uL (ref 0.1–1.0)
Monocytes Relative: 6 %
Monocytes Relative: 4 %
Neutro Abs: 5.6 10*3/uL (ref 1.7–7.7)
Neutro Abs: 6 10*3/uL (ref 1.7–7.7)
Neutrophils Relative %: 79 %
Neutrophils Relative %: 81 %
Platelets: 251 10*3/uL (ref 150–400)
Platelets: 205 10*3/uL (ref 150–400)
RBC: 4.93 MIL/uL (ref 3.87–5.11)
RBC: 4.86 MIL/uL (ref 3.87–5.11)
RDW: 14.8 % (ref 11.5–15.5)
RDW: 14.7 % (ref 11.5–15.5)
WBC: 7.1 10*3/uL (ref 4.0–10.5)
WBC: 7.5 10*3/uL (ref 4.0–10.5)
nRBC: 0.3 % — ABNORMAL HIGH (ref 0.0–0.2)
nRBC: 0.5 % — ABNORMAL HIGH (ref 0.0–0.2)

## 2020-07-31 LAB — PROCALCITONIN: Procalcitonin: 0.1 ng/mL

## 2020-07-31 LAB — CBG MONITORING, ED: Glucose-Capillary: 97 mg/dL (ref 70–99)

## 2020-07-31 LAB — COMPREHENSIVE METABOLIC PANEL
ALT: 34 U/L (ref 0–44)
ALT: 32 U/L (ref 0–44)
AST: 70 U/L — ABNORMAL HIGH (ref 15–41)
AST: 56 U/L — ABNORMAL HIGH (ref 15–41)
Albumin: 3.1 g/dL — ABNORMAL LOW (ref 3.5–5.0)
Albumin: 3.1 g/dL — ABNORMAL LOW (ref 3.5–5.0)
Alkaline Phosphatase: 93 U/L (ref 38–126)
Alkaline Phosphatase: 97 U/L (ref 38–126)
Anion gap: 16 — ABNORMAL HIGH (ref 5–15)
Anion gap: 14 (ref 5–15)
BUN: 23 mg/dL (ref 8–23)
BUN: 22 mg/dL (ref 8–23)
CO2: 15 mmol/L — ABNORMAL LOW (ref 22–32)
CO2: 19 mmol/L — ABNORMAL LOW (ref 22–32)
Calcium: 8.5 mg/dL — ABNORMAL LOW (ref 8.9–10.3)
Calcium: 8.5 mg/dL — ABNORMAL LOW (ref 8.9–10.3)
Chloride: 107 mmol/L (ref 98–111)
Chloride: 107 mmol/L (ref 98–111)
Creatinine, Ser: 1.17 mg/dL — ABNORMAL HIGH (ref 0.44–1.00)
Creatinine, Ser: 1.05 mg/dL — ABNORMAL HIGH (ref 0.44–1.00)
GFR, Estimated: 51 mL/min — ABNORMAL LOW (ref >60)
GFR, Estimated: 58 mL/min — ABNORMAL LOW (ref >60)
Glucose, Bld: 191 mg/dL — ABNORMAL HIGH (ref 70–99)
Glucose, Bld: 132 mg/dL — ABNORMAL HIGH (ref 70–99)
Potassium: 3.8 mmol/L (ref 3.5–5.1)
Potassium: 3.5 mmol/L (ref 3.5–5.1)
Sodium: 138 mmol/L (ref 135–145)
Sodium: 140 mmol/L (ref 135–145)
Total Bilirubin: 1.3 mg/dL — ABNORMAL HIGH (ref 0.3–1.2)
Total Bilirubin: 0.9 mg/dL (ref 0.3–1.2)
Total Protein: 7.1 g/dL (ref 6.5–8.1)
Total Protein: 6.9 g/dL (ref 6.5–8.1)

## 2020-07-31 LAB — LACTIC ACID, PLASMA
Lactic Acid, Venous: 3 mmol/L (ref 0.5–1.9)
Lactic Acid, Venous: 3 mmol/L (ref 0.5–1.9)
Lactic Acid, Venous: 2.2 mmol/L (ref 0.5–1.9)

## 2020-07-31 LAB — GLUCOSE, CAPILLARY: Glucose-Capillary: 153 mg/dL — ABNORMAL HIGH (ref 70–99)

## 2020-07-31 LAB — C-REACTIVE PROTEIN: CRP: 4.6 mg/dL — ABNORMAL HIGH (ref <1.0)

## 2020-07-31 LAB — PROTIME-INR
INR: 1.1 (ref 0.8–1.2)
Prothrombin Time: 13.4 seconds (ref 11.4–15.2)

## 2020-07-31 LAB — LACTATE DEHYDROGENASE: LDH: 966 U/L — ABNORMAL HIGH (ref 98–192)

## 2020-07-31 LAB — APTT: aPTT: 27 seconds (ref 24–36)

## 2020-07-31 LAB — D-DIMER, QUANTITATIVE: D-Dimer, Quant: 3.97 ug/mL-FEU — ABNORMAL HIGH (ref 0.00–0.50)

## 2020-07-31 LAB — SARS CORONAVIRUS 2 BY RT PCR (HOSPITAL ORDER, PERFORMED IN ~~LOC~~ HOSPITAL LAB): SARS Coronavirus 2: POSITIVE — AB

## 2020-07-31 LAB — FERRITIN: Ferritin: 602 ng/mL — ABNORMAL HIGH (ref 11–307)

## 2020-07-31 LAB — TRIGLYCERIDES: Triglycerides: 174 mg/dL — ABNORMAL HIGH (ref <150)

## 2020-07-31 LAB — FIBRINOGEN: Fibrinogen: 524 mg/dL — ABNORMAL HIGH (ref 210–475)

## 2020-07-31 MED ORDER — SODIUM CHLORIDE 0.9% FLUSH
3.0000 mL | Freq: Two times a day (BID) | INTRAVENOUS | Status: DC
  Administered 2020-08-01 – 2020-09-23 (×96): 3 mL via INTRAVENOUS

## 2020-07-31 MED ORDER — SODIUM CHLORIDE 0.9 % IV SOLN: 200.0000 mg | Freq: Once | INTRAVENOUS | Status: DC

## 2020-07-31 MED ORDER — INSULIN DETEMIR 100 UNIT/ML ~~LOC~~ SOLN
0.1500 [IU]/kg | Freq: Two times a day (BID) | SUBCUTANEOUS | Status: DC
  Administered 2020-07-31 – 2020-08-20 (×37): 15 [IU] via SUBCUTANEOUS
  Filled 2020-07-31 (×40): qty 0.15

## 2020-07-31 MED ORDER — HYDROCODONE-ACETAMINOPHEN 10-325 MG PO TABS
0.5000 | ORAL_TABLET | Freq: Four times a day (QID) | ORAL | Status: DC | PRN
  Administered 2020-08-17 – 2020-08-26 (×2): 1 via ORAL
  Administered 2020-09-17: 0.5 via ORAL
  Filled 2020-07-31 (×4): qty 1

## 2020-07-31 MED ORDER — PREDNISONE 50 MG PO TABS: 50.0000 mg | ORAL_TABLET | Freq: Every day | ORAL | Status: DC

## 2020-07-31 MED ORDER — INSULIN ASPART 100 UNIT/ML ~~LOC~~ SOLN
4.0000 [IU] | Freq: Three times a day (TID) | SUBCUTANEOUS | Status: DC
  Administered 2020-08-01 – 2020-09-01 (×52): 4 [IU] via SUBCUTANEOUS
  Filled 2020-07-31: qty 0.04

## 2020-07-31 MED ORDER — LINAGLIPTIN 5 MG PO TABS
5.0000 mg | ORAL_TABLET | Freq: Every day | ORAL | Status: DC
  Administered 2020-07-31 – 2020-09-01 (×33): 5 mg via ORAL
  Filled 2020-07-31 (×33): qty 1

## 2020-07-31 MED ORDER — SODIUM CHLORIDE 0.9 % IV SOLN
100.0000 mg | Freq: Every day | INTRAVENOUS | Status: AC
  Administered 2020-08-01 – 2020-08-04 (×4): 100 mg via INTRAVENOUS
  Filled 2020-07-31 (×4): qty 20

## 2020-07-31 MED ORDER — ATORVASTATIN CALCIUM 40 MG PO TABS
40.0000 mg | ORAL_TABLET | Freq: Every day | ORAL | Status: DC
  Administered 2020-07-31 – 2020-09-22 (×53): 40 mg via ORAL
  Filled 2020-07-31 (×54): qty 1

## 2020-07-31 MED ORDER — PANTOPRAZOLE SODIUM 40 MG PO TBEC
40.0000 mg | DELAYED_RELEASE_TABLET | Freq: Every day | ORAL | Status: DC
  Administered 2020-07-31 – 2020-09-14 (×46): 40 mg via ORAL
  Filled 2020-07-31 (×46): qty 1

## 2020-07-31 MED ORDER — GABAPENTIN 100 MG PO CAPS
200.0000 mg | ORAL_CAPSULE | Freq: Every day | ORAL | Status: DC
  Administered 2020-07-31 – 2020-09-22 (×54): 200 mg via ORAL
  Filled 2020-07-31 (×54): qty 2

## 2020-07-31 MED ORDER — SODIUM CHLORIDE 0.9 % IV BOLUS
1000.0000 mL | Freq: Once | INTRAVENOUS | Status: AC
  Administered 2020-07-31: 1000 mL via INTRAVENOUS

## 2020-07-31 MED ORDER — GUAIFENESIN-DM 100-10 MG/5ML PO SYRP: 10.0000 mL | ORAL_SOLUTION | ORAL | Status: DC | PRN

## 2020-07-31 MED ORDER — FLUTICASONE FUROATE-VILANTEROL 200-25 MCG/INH IN AEPB
1.0000 | INHALATION_SPRAY | Freq: Every day | RESPIRATORY_TRACT | Status: DC
  Filled 2020-07-31: qty 28

## 2020-07-31 MED ORDER — ENOXAPARIN SODIUM 40 MG/0.4ML ~~LOC~~ SOLN
40.0000 mg | SUBCUTANEOUS | Status: DC
  Administered 2020-07-31: 40 mg via SUBCUTANEOUS
  Filled 2020-07-31: qty 0.4

## 2020-07-31 MED ORDER — BUSPIRONE HCL 10 MG PO TABS
10.0000 mg | ORAL_TABLET | Freq: Two times a day (BID) | ORAL | Status: DC
Start: 2020-07-31 — End: 2020-09-20
  Administered 2020-07-31 – 2020-09-20 (×102): 10 mg via ORAL
  Filled 2020-07-31 (×21): qty 1
  Filled 2020-07-31: qty 2
  Filled 2020-07-31 (×81): qty 1

## 2020-07-31 MED ORDER — TOPIRAMATE 25 MG PO TABS
50.0000 mg | ORAL_TABLET | Freq: Every day | ORAL | Status: DC
  Administered 2020-07-31 – 2020-09-22 (×54): 50 mg via ORAL
  Filled 2020-07-31 (×54): qty 2

## 2020-07-31 MED ORDER — ALPRAZOLAM 0.5 MG PO TABS
0.5000 mg | ORAL_TABLET | Freq: Every day | ORAL | Status: DC | PRN
  Administered 2020-08-03 – 2020-08-09 (×4): 0.5 mg via ORAL
  Filled 2020-07-31 (×5): qty 1

## 2020-07-31 MED ORDER — INSULIN ASPART 100 UNIT/ML ~~LOC~~ SOLN
0.0000 [IU] | Freq: Three times a day (TID) | SUBCUTANEOUS | Status: DC
  Administered 2020-08-01: 2 [IU] via SUBCUTANEOUS
  Administered 2020-08-01: 6 [IU] via SUBCUTANEOUS
  Administered 2020-08-01: 2 [IU] via SUBCUTANEOUS
  Administered 2020-08-03: 3 [IU] via SUBCUTANEOUS
  Administered 2020-08-07: 2 [IU] via SUBCUTANEOUS
  Administered 2020-08-08 – 2020-08-09 (×2): 3 [IU] via SUBCUTANEOUS
  Administered 2020-08-10 (×2): 2 [IU] via SUBCUTANEOUS
  Administered 2020-08-12 – 2020-08-14 (×3): 3 [IU] via SUBCUTANEOUS
  Administered 2020-08-14 – 2020-08-17 (×5): 2 [IU] via SUBCUTANEOUS
  Administered 2020-08-17: 3 [IU] via SUBCUTANEOUS
  Administered 2020-08-17: 2 [IU] via SUBCUTANEOUS
  Administered 2020-08-18 – 2020-08-21 (×2): 3 [IU] via SUBCUTANEOUS
  Administered 2020-08-21: 2 [IU] via SUBCUTANEOUS
  Administered 2020-08-22: 5 [IU] via SUBCUTANEOUS
  Administered 2020-08-23: 3 [IU] via SUBCUTANEOUS
  Administered 2020-08-24 (×2): 2 [IU] via SUBCUTANEOUS
  Administered 2020-08-24 – 2020-08-26 (×5): 3 [IU] via SUBCUTANEOUS
  Administered 2020-08-27: 2 [IU] via SUBCUTANEOUS
  Administered 2020-08-27 – 2020-08-30 (×7): 3 [IU] via SUBCUTANEOUS
  Administered 2020-08-31: 2 [IU] via SUBCUTANEOUS
  Administered 2020-08-31 – 2020-09-01 (×2): 3 [IU] via SUBCUTANEOUS
  Administered 2020-09-01: 2 [IU] via SUBCUTANEOUS
  Administered 2020-09-02 (×2): 3 [IU] via SUBCUTANEOUS
  Administered 2020-09-03: 5 [IU] via SUBCUTANEOUS
  Administered 2020-09-03: 3 [IU] via SUBCUTANEOUS
  Administered 2020-09-04: 5 [IU] via SUBCUTANEOUS
  Administered 2020-09-04 – 2020-09-05 (×2): 2 [IU] via SUBCUTANEOUS
  Administered 2020-09-05 – 2020-09-06 (×3): 3 [IU] via SUBCUTANEOUS
  Administered 2020-09-07: 5 [IU] via SUBCUTANEOUS
  Administered 2020-09-07: 3 [IU] via SUBCUTANEOUS
  Administered 2020-09-08: 5 [IU] via SUBCUTANEOUS
  Administered 2020-09-08: 3 [IU] via SUBCUTANEOUS
  Administered 2020-09-08 – 2020-09-09 (×2): 2 [IU] via SUBCUTANEOUS
  Administered 2020-09-10: 3 [IU] via SUBCUTANEOUS
  Administered 2020-09-11: 2 [IU] via SUBCUTANEOUS
  Administered 2020-09-11 – 2020-09-12 (×2): 3 [IU] via SUBCUTANEOUS
  Administered 2020-09-12: 5 [IU] via SUBCUTANEOUS
  Administered 2020-09-13: 2 [IU] via SUBCUTANEOUS
  Administered 2020-09-13: 5 [IU] via SUBCUTANEOUS
  Administered 2020-09-14: 2 [IU] via SUBCUTANEOUS
  Administered 2020-09-14 – 2020-09-15 (×2): 3 [IU] via SUBCUTANEOUS
  Administered 2020-09-15: 5 [IU] via SUBCUTANEOUS
  Administered 2020-09-16: 3 [IU] via SUBCUTANEOUS
  Administered 2020-09-16: 5 [IU] via SUBCUTANEOUS
  Administered 2020-09-17: 3 [IU] via SUBCUTANEOUS
  Administered 2020-09-17: 8 [IU] via SUBCUTANEOUS
  Administered 2020-09-18: 5 [IU] via SUBCUTANEOUS
  Administered 2020-09-18 – 2020-09-19 (×3): 3 [IU] via SUBCUTANEOUS
  Administered 2020-09-20: 5 [IU] via SUBCUTANEOUS
  Administered 2020-09-20: 2 [IU] via SUBCUTANEOUS
  Administered 2020-09-21 – 2020-09-22 (×3): 5 [IU] via SUBCUTANEOUS
  Administered 2020-09-22: 3 [IU] via SUBCUTANEOUS
  Filled 2020-07-31: qty 0.15

## 2020-07-31 MED ORDER — IPRATROPIUM-ALBUTEROL 20-100 MCG/ACT IN AERS
1.0000 | INHALATION_SPRAY | Freq: Four times a day (QID) | RESPIRATORY_TRACT | Status: DC
  Administered 2020-07-31 – 2020-08-03 (×13): 1 via RESPIRATORY_TRACT
  Filled 2020-07-31: qty 4

## 2020-07-31 MED ORDER — METHYLPREDNISOLONE SODIUM SUCC 125 MG IJ SOLR
1.0000 mg/kg | Freq: Two times a day (BID) | INTRAMUSCULAR | Status: DC
  Administered 2020-07-31 – 2020-08-01 (×2): 100 mg via INTRAVENOUS
  Filled 2020-07-31 (×2): qty 2

## 2020-07-31 MED ORDER — SODIUM CHLORIDE 0.9 % IV SOLN
200.0000 mg | Freq: Once | INTRAVENOUS | Status: AC
  Administered 2020-07-31: 200 mg via INTRAVENOUS
  Filled 2020-07-31: qty 200

## 2020-07-31 MED ORDER — SODIUM CHLORIDE 0.9 % IV BOLUS (SEPSIS)
500.0000 mL | Freq: Once | INTRAVENOUS | Status: AC
  Administered 2020-07-31: 500 mL via INTRAVENOUS

## 2020-07-31 MED ORDER — ASPIRIN EC 81 MG PO TBEC
81.0000 mg | DELAYED_RELEASE_TABLET | Freq: Every day | ORAL | Status: DC
  Administered 2020-08-01 – 2020-08-02 (×2): 81 mg via ORAL
  Filled 2020-07-31 (×2): qty 1

## 2020-07-31 MED ORDER — ACETAMINOPHEN 325 MG PO TABS
650.0000 mg | ORAL_TABLET | Freq: Four times a day (QID) | ORAL | Status: DC | PRN
  Administered 2020-07-31 – 2020-08-16 (×5): 650 mg via ORAL
  Filled 2020-07-31 (×5): qty 2

## 2020-07-31 MED ORDER — SODIUM CHLORIDE 0.9 % IV SOLN: 100.0000 mg | Freq: Every day | INTRAVENOUS | Status: DC

## 2020-07-31 MED ORDER — METOPROLOL SUCCINATE ER 25 MG PO TB24
12.5000 mg | ORAL_TABLET | Freq: Every day | ORAL | Status: DC
  Administered 2020-07-31 – 2020-08-06 (×7): 12.5 mg via ORAL
  Filled 2020-07-31 (×7): qty 1

## 2020-07-31 NOTE — ED Notes (Signed)
Pt placed on purewick 

## 2020-07-31 NOTE — ED Notes (Signed)
Nuc med tech made aware per Dr. Neysa Bonito, will hold on study at this time as patient will not tolerate laying flat.

## 2020-07-31 NOTE — Progress Notes (Signed)
CRITICAL VALUE ALERT  Critical Value: lactic acid: 2.2  Date & Time Notied:  07/31/20 @ 2116  Provider Notified: X. Blount  Orders Received/Actions taken: Waiting on new orders.

## 2020-07-31 NOTE — ED Notes (Signed)
Hospitalist at bedside 

## 2020-07-31 NOTE — ED Notes (Signed)
Spoke with nuclear med tech, Museum/gallery conservator, who reports patient will have to lay flat for study. Dr. Neysa Bonito made aware. Patient remains on NRB.

## 2020-07-31 NOTE — ED Notes (Addendum)
Date and time results received: 07/31/20 3:41 PM  Test: Covid Critical Value: Positive  Name of Provider Notified: Abigail PA  Orders Received? Or Actions Taken?: PA made aware
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
## 2020-07-31 NOTE — ED Provider Notes (Signed)
[facility] DEPT Provider Note   CSN: [id number] Arrival date & time: 07/31/20  1240     History Chief Complaint  Patient presents with  . Shortness of Breath    [name] is a 70 y.o. female.  HPI Adult female who has not received her COVID vaccine presents with 2 days of fever, cough, dyspnea. Patient does not wear oxygen 24/7, does have BiPAP at night. Over the past 2 days patient has felt progressively ill, with no relief in spite of OTC medication. No particular pain, though she feels sore all over. Per EMS the patient had a fever of 103 in route, as well as hypoxia, 87% on room air.  This improved with supplemental oxygen..     Past Medical History:  Diagnosis Date  . Adenomatous colon polyp 2006  . Arrhythmia    Heart  . Arthritis   . Asthma   . Chronic headaches   . Complication of anesthesia   . Diabetes mellitus type II, controlled ([place])    Diet  . Gastric polyp    hyperplastic  . GERD (gastroesophageal reflux disease)   . Headache   . Hyperlipidemia   . Hypertension   . IBS (irritable bowel syndrome)   . Kidney stones   . Lymphocytic colitis   . Myocardial infarct ([place])   . Neuropathy, peripheral   . PONV (postoperative nausea and vomiting)   . Sleep apnea    no CPAP  . Ulcer   . Urinary tract infection     Patient Active Problem List   Diagnosis Date Noted  . Bigeminy 05/01/2018  . Dyslipidemia   . NICM (nonischemic cardiomyopathy) ([place])   . Status post cardiac catheterization 04/30/2018  . PVC's (premature ventricular contractions) 03/20/2018  . Chest pain 09/19/2014  . Normal coronary arteries- 09/19/2014  . Sleep apnea-C pap intol 09/19/2014  . Dizziness 08/08/2012  . HTN (hypertension) 08/08/2012  . Diabetes mellitus type 2 in obese ([place]) 08/08/2012  . Obesity BMI 36 08/08/2012  . Diarrhea of presumed infectious origin 12/27/2010  . GERD (gastroesophageal reflux disease) 12/27/2010  . History of  colon polyps 12/27/2010  . Dysphagia 12/27/2010    Past Surgical History:  Procedure Laterality Date  . ABDOMINAL HYSTERECTOMY    . ABDOMINAL HYSTERECTOMY    . BACK SURGERY    . BLADDER REPAIR     x2  . CARDIAC CATHETERIZATION  04/2008  . CHOLECYSTECTOMY  2000  . LEFT HEART CATHETERIZATION WITH CORONARY ANGIOGRAM N/A 08/08/2012   Procedure: LEFT HEART CATHETERIZATION WITH CORONARY ANGIOGRAM;  Surgeon: Lorretta Harp, MD;  Location: [facility] C[facility];  Service: Cardiovascular;  Laterality: N/A;  . RIGHT/LEFT HEART CATH AND CORONARY ANGIOGRAPHY N/A 04/30/2018   Procedure: RIGHT/LEFT HEART CATH AND CORONARY ANGIOGRAPHY;  Surgeon: Lorretta Harp, MD;  Location: [facility] CV LAB;  Service: Cardiovascular;  Laterality: N/A;  Stephanie Coup ABLATION N/A 07/19/2018   Procedure: Stephanie Coup ABLATION;  Surgeon: Thompson Grayer, MD;  Location: [facility] LAB;  Service: Cardiovascular;  Laterality: N/A;     OB History   No obstetric history on file.     Family History  Problem Relation Age of Onset  . Hypertension Mother   . Colon cancer Paternal Uncle   . Esophageal cancer Neg Hx   . Stomach cancer Neg Hx   . Rectal cancer Neg Hx     Social History   Tobacco Use  . Smoking status: Never Smoker  .  Smokeless tobacco: Never Used  Vaping Use  . Vaping Use: Never used  Substance Use Topics  . Alcohol use: No  . Drug use: No    Home Medications Prior to Admission medications   Medication Sig Start Date End Date Taking? Authorizing Provider  ALPRAZolam Duanne Moron) 1 MG tablet Take 0.5 mg by mouth daily as needed for anxiety.     [provider]  aspirin 81 MG tablet Take 81 mg by mouth daily.     [provider]  atorvastatin (LIPITOR) 40 MG tablet Take 1 tablet (40 mg total) by mouth daily at 6 PM. 05/01/18   Almyra Deforest, PA  busPIRone (BUSPAR) 10 MG tablet Take 10 mg by mouth 2 (two) times daily.     [provider]  cholecalciferol (VITAMIN D) 1000 UNITS tablet Take  2,000 Units by mouth daily.     [provider]  gabapentin (NEURONTIN) 100 MG capsule Take 200 mg by mouth at bedtime.    [provider]  HYDROcodone-acetaminophen (NORCO) 10-325 MG per tablet Take 0.5-1 tablets by mouth every 6 (six) hours as needed for moderate pain or severe pain.     [provider]  meclizine (ANTIVERT) 25 MG tablet Take 25 mg by mouth 3 (three) times daily as needed for dizziness.    [provider]  metoprolol succinate (TOPROL-XL) 25 MG 24 hr tablet Take 0.5 tablets (12.5 mg total) by mouth daily. 11/16/18   Allred, Jeneen Rinks, MD  nitroGLYCERIN (NITROSTAT) 0.4 MG SL tablet Place 1 tablet (0.4 mg total) under the tongue every 5 (five) minutes as needed for chest pain. 06/16/20   Lorretta Harp, MD  omeprazole (PRILOSEC) 20 MG capsule TAKE ONE CAPSULE BY MOUTH TWICE A DAY 05/07/12   Lafayette Dragon, MD  sacubitril-valsartan (ENTRESTO) 24-26 MG Take 1 tablet by mouth 2 (two) times daily. 05/29/18   Lorretta Harp, MD  tiZANidine (ZANAFLEX) 4 MG capsule Take 4-8 mg by mouth at bedtime. For muscle pain    [provider]  topiramate (TOPAMAX) 25 MG tablet Take 25 mg by mouth at bedtime. 02/11/19   [provider]  Turmeric 500 MG CAPS Take 500 mg by mouth daily.    [provider]  vitamin E (VITAMIN E) 180 MG (400 UNITS) capsule Take 400 Units by mouth daily.    [provider]    Allergies    Penicillins, Shellfish-derived products, Sulfa antibiotics, and Morphine and related  Review of Systems   Review of Systems  Constitutional:       Per HPI, otherwise negative  HENT:       Per HPI, otherwise negative  Respiratory:       Per HPI, otherwise negative  Cardiovascular:       Per HPI, otherwise negative  Gastrointestinal: Negative for vomiting.  Endocrine:       Negative aside from HPI  Genitourinary:       Neg aside from HPI   Musculoskeletal:       Per HPI, otherwise negative  Skin:  Negative.   Neurological: Positive for weakness. Negative for syncope.    Physical Exam Updated Vital Signs BP 122/84   Pulse 95   Temp 99.8 F (37.7 C) (Axillary)   Resp (!) 22   Ht 5\' 5"  (1.651 m)   Wt 99.8 kg   SpO2 (!) 87%   BMI 36.61 kg/m   Physical Exam Vitals and nursing note reviewed.  Constitutional:  Appearance: She is well-nourished. She is obese. She is ill-appearing and diaphoretic.  HENT:     Head: Normocephalic and atraumatic.  Eyes:     Extraocular Movements: EOM normal.     Conjunctiva/sclera: Conjunctivae normal.  Cardiovascular:     Rate and Rhythm: Regular rhythm. Tachycardia present.  Pulmonary:     Effort: Pulmonary effort is normal. Tachypnea present.     Breath sounds: Decreased breath sounds present.  Abdominal:     General: There is no distension.  Musculoskeletal:        General: No edema.  Skin:    General: Skin is warm.  Neurological:     Mental Status: She is alert and oriented to person, place, and time.     Cranial Nerves: No cranial nerve deficit.  Psychiatric:        Mood and Affect: Mood and affect normal.     ED Results / Procedures / Treatments   Labs (all labs ordered are listed, but only abnormal results are displayed) Labs Reviewed  SARS CORONAVIRUS 2 BY RT PCR ([place], [place] [facility]) - Abnormal; Notable for the following components:      Result Value   SARS Coronavirus 2 POSITIVE (*)    All other components within normal limits  BLOOD CULTURE ID PANEL (REFLEXED) - BCID2 - Abnormal; Notable for the following components:   Staphylococcus species DETECTED (*)    All other components within normal limits  LACTIC ACID, PLASMA - Abnormal; Notable for the following components:   Lactic Acid, Venous 3.0 (*)    All other components within normal limits  LACTIC ACID, PLASMA - Abnormal; Notable for the following components:   Lactic Acid, Venous 3.0 (*)    All other components within normal  limits  CBC WITH DIFFERENTIAL/PLATELET - Abnormal; Notable for the following components:   nRBC 0.3 (*)    Abs Immature Granulocytes 0.29 (*)    All other components within normal limits  COMPREHENSIVE METABOLIC PANEL - Abnormal; Notable for the following components:   CO2 15 (*)    Glucose, Bld 191 (*)    Creatinine, Ser 1.17 (*)    Calcium 8.5 (*)    Albumin 3.1 (*)    AST 70 (*)    Total Bilirubin 1.3 (*)    GFR, Estimated 51 (*)    Anion gap 16 (*)    All other components within normal limits  D-DIMER, QUANTITATIVE (NOT AT [facility]) - Abnormal; Notable for the following components:   D-Dimer, Quant 3.97 (*)    All other components within normal limits  LACTATE DEHYDROGENASE - Abnormal; Notable for the following components:   LDH 966 (*)    All other components within normal limits  TRIGLYCERIDES - Abnormal; Notable for the following components:   Triglycerides 174 (*)    All other components within normal limits  FIBRINOGEN - Abnormal; Notable for the following components:   Fibrinogen 524 (*)    All other components within normal limits  CBC WITH DIFFERENTIAL/PLATELET - Abnormal; Notable for the following components:   nRBC 0.5 (*)    Abs Immature Granulocytes 0.32 (*)    All other components within normal limits  COMPREHENSIVE METABOLIC PANEL - Abnormal; Notable for the following components:   CO2 19 (*)    Glucose, Bld 132 (*)    Creatinine, Ser 1.05 (*)    Calcium 8.5 (*)    Albumin 3.1 (*)    AST 56 (*)  GFR, Estimated 58 (*)    All other components within normal limits  LACTIC ACID, PLASMA - Abnormal; Notable for the following components:   Lactic Acid, Venous 2.2 (*)    All other components within normal limits  CBC WITH DIFFERENTIAL/PLATELET - Abnormal; Notable for the following components:   nRBC 1.1 (*)    Lymphs Abs 0.5 (*)    Abs Immature Granulocytes 0.35 (*)    All other components within normal limits  COMPREHENSIVE METABOLIC PANEL - Abnormal; Notable  for the following components:   CO2 19 (*)    Glucose, Bld 177 (*)    Calcium 8.3 (*)    Total Protein 6.4 (*)    Albumin 2.9 (*)    AST 47 (*)    All other components within normal limits  C-REACTIVE PROTEIN - Abnormal; Notable for the following components:   CRP 3.9 (*)    All other components within normal limits  D-DIMER, QUANTITATIVE (NOT AT [facility]) - Abnormal; Notable for the following components:   D-Dimer, Quant 11.13 (*)    All other components within normal limits  FERRITIN - Abnormal; Notable for the following components:   Ferritin 505 (*)    All other components within normal limits  C-REACTIVE PROTEIN - Abnormal; Notable for the following components:   CRP 4.6 (*)    All other components within normal limits  FERRITIN - Abnormal; Notable for the following components:   Ferritin 602 (*)    All other components within normal limits  GLUCOSE, CAPILLARY - Abnormal; Notable for the following components:   Glucose-Capillary 153 (*)    All other components within normal limits  GLUCOSE, CAPILLARY - Abnormal; Notable for the following components:   Glucose-Capillary 151 (*)    All other components within normal limits  HEMOGLOBIN A1C - Abnormal; Notable for the following components:   Hgb A1c MFr Bld 6.0 (*)    All other components within normal limits  GLUCOSE, CAPILLARY - Abnormal; Notable for the following components:   Glucose-Capillary 144 (*)    All other components within normal limits  CULTURE, BLOOD (ROUTINE X 2)  CULTURE, BLOOD (ROUTINE X 2)  CULTURE, BLOOD (ROUTINE X 2)  CULTURE, BLOOD (ROUTINE X 2)  PROCALCITONIN  PROTIME-INR  APTT  HIV ANTIBODY (ROUTINE TESTING W REFLEX)  CBG MONITORING, ED    EKG EKG Interpretation  Date/Time:  Friday July 31 2020 13:10:57 EST Ventricular Rate:  96 PR Interval:    QRS Duration: 93 QT Interval:  373 QTC Calculation: 472 R Axis:   11 Text Interpretation: Sinus rhythm Low voltage, precordial leads Baseline  wander Artifact T wave abnormality Abnormal ECG Confirmed by Carmin Muskrat [phone]) on 07/31/2020 1:17:09 PM   Radiology  DG Chest Port 1 View  Result Date: 07/31/2020 CLINICAL DATA:  COVID positive, shortness of breath EXAM: PORTABLE CHEST 1 VIEW COMPARISON:  03/27/2015 FINDINGS: The heart size and mediastinal contours are within normal limits. Bilateral heterogeneous and interstitial airspace opacity. The visualized skeletal structures are unremarkable. IMPRESSION: Bilateral heterogeneous and interstitial airspace opacity, in keeping with reported COVID-19 pneumonia. Electronically Signed   By: Eddie Candle M.D.   On: 07/31/2020 14:16  Procedures Procedures   Medications Ordered in ED Medications  remdesivir 200 mg in sodium chloride 0.9% 250 mL IVPB ( Intravenous Restarted 07/31/20 2001)  ED Course  I have reviewed the triage vital signs and the nursing notes.  Pertinent labs & imaging results that were available during my care of the patient were reviewed by me and considered in my medical decision making (see chart for details).     On monitor the patient has sinus tachycardia, rate 100, with sinus rhythm, rate 90s, overtly nonischemic, but abnormal Pulse oximetry 96% with NRB mask 6L, abnormal  Elderly female not vaccinated presents with cough, dyspnea, is found to have COVID with new oxygen requirement.  Here patient started on remdesivir, and I discussed the case with our internal medicine colleagues for continued appropriate interventions, supplemental oxygen provision. Final Clinical Impression(s) / ED Diagnoses Final diagnoses:  Acute hypoxemic respiratory failure due to COVID-19 [name])     Carmin Muskrat, MD 08/01/20 [phone]

## 2020-07-31 NOTE — ED Triage Notes (Signed)
Pt is coming from home and tested positive for Covid 3 days ago and has not treated it. Pt reports shob and fever at this time. Pt is hypoxic, febrile, and tachycardic.

## 2020-08-01 ENCOUNTER — Inpatient Hospital Stay (HOSPITAL_COMMUNITY): Payer: Medicare HMO

## 2020-08-01 ENCOUNTER — Encounter (HOSPITAL_COMMUNITY): Payer: Self-pay | Admitting: Internal Medicine

## 2020-08-01 ENCOUNTER — Other Ambulatory Visit (HOSPITAL_COMMUNITY): Payer: Medicare HMO

## 2020-08-01 DIAGNOSIS — R7989 Other specified abnormal findings of blood chemistry: Secondary | ICD-10-CM | POA: Diagnosis not present

## 2020-08-01 DIAGNOSIS — R0602 Shortness of breath: Secondary | ICD-10-CM

## 2020-08-01 DIAGNOSIS — U071 COVID-19: Secondary | ICD-10-CM | POA: Diagnosis not present

## 2020-08-01 DIAGNOSIS — J9601 Acute respiratory failure with hypoxia: Secondary | ICD-10-CM | POA: Diagnosis not present

## 2020-08-01 LAB — BLOOD CULTURE ID PANEL (REFLEXED) - BCID2

## 2020-08-01 LAB — COMPREHENSIVE METABOLIC PANEL
ALT: 31 U/L (ref 0–44)
AST: 47 U/L — ABNORMAL HIGH (ref 15–41)
Albumin: 2.9 g/dL — ABNORMAL LOW (ref 3.5–5.0)
Alkaline Phosphatase: 84 U/L (ref 38–126)
Anion gap: 13 (ref 5–15)
BUN: 18 mg/dL (ref 8–23)
CO2: 19 mmol/L — ABNORMAL LOW (ref 22–32)
Calcium: 8.3 mg/dL — ABNORMAL LOW (ref 8.9–10.3)
Chloride: 110 mmol/L (ref 98–111)
Creatinine, Ser: 0.84 mg/dL (ref 0.44–1.00)
GFR, Estimated: >60 mL/min (ref >60)
Glucose, Bld: 177 mg/dL — ABNORMAL HIGH (ref 70–99)
Potassium: 3.5 mmol/L (ref 3.5–5.1)
Sodium: 142 mmol/L (ref 135–145)
Total Bilirubin: 0.8 mg/dL (ref 0.3–1.2)
Total Protein: 6.4 g/dL — ABNORMAL LOW (ref 6.5–8.1)

## 2020-08-01 LAB — ECHOCARDIOGRAM LIMITED
Area-P 1/2: 3.91 cm2
Height: 65 in
S' Lateral: 2.7 cm
Weight: 3520 oz

## 2020-08-01 LAB — CBC WITH DIFFERENTIAL/PLATELET
Abs Immature Granulocytes: 0.35 10*3/uL — ABNORMAL HIGH (ref 0.00–0.07)
Basophils Absolute: 0 10*3/uL (ref 0.0–0.1)
Basophils Relative: 0 %
Eosinophils Absolute: 0 10*3/uL (ref 0.0–0.5)
Eosinophils Relative: 0 %
HCT: 38.3 % (ref 36.0–46.0)
Hemoglobin: 12.6 g/dL (ref 12.0–15.0)
Immature Granulocytes: 7 %
Lymphocytes Relative: 10 %
Lymphs Abs: 0.5 10*3/uL — ABNORMAL LOW (ref 0.7–4.0)
MCH: 27.8 pg (ref 26.0–34.0)
MCHC: 32.9 g/dL (ref 30.0–36.0)
MCV: 84.5 fL (ref 80.0–100.0)
Monocytes Absolute: 0.2 10*3/uL (ref 0.1–1.0)
Monocytes Relative: 4 %
Neutro Abs: 3.7 10*3/uL (ref 1.7–7.7)
Neutrophils Relative %: 79 %
Platelets: 205 10*3/uL (ref 150–400)
RBC: 4.53 MIL/uL (ref 3.87–5.11)
RDW: 14.9 % (ref 11.5–15.5)
WBC: 4.7 10*3/uL (ref 4.0–10.5)
nRBC: 1.1 % — ABNORMAL HIGH (ref 0.0–0.2)

## 2020-08-01 LAB — GLUCOSE, CAPILLARY
Glucose-Capillary: 151 mg/dL — ABNORMAL HIGH (ref 70–99)
Glucose-Capillary: 144 mg/dL — ABNORMAL HIGH (ref 70–99)
Glucose-Capillary: 125 mg/dL — ABNORMAL HIGH (ref 70–99)
Glucose-Capillary: 109 mg/dL — ABNORMAL HIGH (ref 70–99)

## 2020-08-01 LAB — D-DIMER, QUANTITATIVE: D-Dimer, Quant: 11.13 ug/mL-FEU — ABNORMAL HIGH (ref 0.00–0.50)

## 2020-08-01 LAB — HEMOGLOBIN A1C
Hgb A1c MFr Bld: 6 % — ABNORMAL HIGH (ref 4.8–5.6)
Mean Plasma Glucose: 125.5 mg/dL

## 2020-08-01 LAB — FERRITIN: Ferritin: 505 ng/mL — ABNORMAL HIGH (ref 11–307)

## 2020-08-01 LAB — C-REACTIVE PROTEIN: CRP: 3.9 mg/dL — ABNORMAL HIGH (ref <1.0)

## 2020-08-01 LAB — HIV ANTIBODY (ROUTINE TESTING W REFLEX): HIV Screen 4th Generation wRfx: NONREACTIVE

## 2020-08-01 MED ORDER — METHYLPREDNISOLONE SODIUM SUCC 125 MG IJ SOLR
50.0000 mg | Freq: Four times a day (QID) | INTRAMUSCULAR | Status: AC
  Administered 2020-08-01 – 2020-08-04 (×12): 50 mg via INTRAVENOUS
  Filled 2020-08-01 (×12): qty 2

## 2020-08-01 MED ORDER — METHYLPREDNISOLONE SODIUM SUCC 125 MG IJ SOLR
50.0000 mg | Freq: Two times a day (BID) | INTRAMUSCULAR | Status: DC
Start: 2020-08-04 — End: 2020-08-06
  Administered 2020-08-04 – 2020-08-06 (×4): 50 mg via INTRAVENOUS
  Filled 2020-08-01 (×4): qty 2

## 2020-08-01 MED ORDER — TOCILIZUMAB 400 MG/20ML IV SOLN
800.0000 mg | Freq: Once | INTRAVENOUS | Status: AC
  Administered 2020-08-01: 800 mg via INTRAVENOUS
  Filled 2020-08-01: qty 40

## 2020-08-01 MED ORDER — IOHEXOL 350 MG/ML SOLN
100.0000 mL | Freq: Once | INTRAVENOUS | Status: AC | PRN
  Administered 2020-08-01: 100 mL via INTRAVENOUS

## 2020-08-01 MED ORDER — ORAL CARE MOUTH RINSE
15.0000 mL | Freq: Two times a day (BID) | OROMUCOSAL | Status: DC
  Administered 2020-08-01 – 2020-09-23 (×101): 15 mL via OROMUCOSAL

## 2020-08-01 MED ORDER — VANCOMYCIN HCL 1250 MG/250ML IV SOLN
1250.0000 mg | INTRAVENOUS | Status: DC
  Administered 2020-08-02 – 2020-08-06 (×5): 1250 mg via INTRAVENOUS
  Filled 2020-08-01 (×7): qty 250

## 2020-08-01 MED ORDER — ENOXAPARIN SODIUM 100 MG/ML ~~LOC~~ SOLN
100.0000 mg | Freq: Two times a day (BID) | SUBCUTANEOUS | Status: DC
  Administered 2020-08-01 – 2020-08-14 (×27): 100 mg via SUBCUTANEOUS
  Filled 2020-08-01 (×27): qty 1

## 2020-08-01 MED ORDER — VANCOMYCIN HCL 1750 MG/350ML IV SOLN
1750.0000 mg | Freq: Once | INTRAVENOUS | Status: AC
  Administered 2020-08-01: 1750 mg via INTRAVENOUS
  Filled 2020-08-01: qty 350

## 2020-08-01 NOTE — Progress Notes (Signed)
ANTICOAGULATION CONSULT NOTE - Initial Consult  Pharmacy Consult for Lovenox Indication: VTE treatment  Allergies  Allergen Reactions  . Penicillins Anaphylaxis and Swelling    Has patient had a PCN reaction causing immediate rash, facial/tongue/throat swelling, SOB or lightheadedness with hypotension: Yes Has patient had a PCN reaction causing severe rash involving mucus membranes or skin necrosis: No Has patient had a PCN reaction that required hospitalization: Yes Has patient had a PCN reaction occurring within the last 10 years: No    . Shellfish-Derived Products Anaphylaxis and Swelling  . Sulfa Antibiotics Anaphylaxis and Swelling  . Codeine Nausea Only  . Morphine Nausea And Vomiting  . Morphine And Related Nausea And Vomiting    Patient Measurements: Height: 5\' 5"  (165.1 cm) Weight: 99.8 kg (220 lb) IBW/kg (Calculated) : 57  Vital Signs: Temp: 98 F (36.7 C) (01/29 0632) Temp Source: Oral (01/29 9528) BP: 139/92 (01/29 4132) Pulse Rate: 79 (01/29 0632)  Labs: Recent Labs    07/31/20 1326 07/31/20 2004 08/01/20 0503  HGB 13.8 13.7 12.6  HCT 40.8 40.4 38.3  PLT 251 205 205  APTT  --  27  --   LABPROT  --  13.4  --   INR  --  1.1  --   CREATININE 1.17* 1.05* 0.84    Estimated Creatinine Clearance: 73.9 mL/min (by C-G formula based on SCr of 0.84 mg/dL).   Medical History: Past Medical History:  Diagnosis Date  . Adenomatous colon polyp 2006  . Arrhythmia    Heart  . Arthritis   . Asthma   . Chronic headaches   . Complication of anesthesia   . Diabetes mellitus type II, controlled ([place])    Diet  . Gastric polyp    hyperplastic  . GERD (gastroesophageal reflux disease)   . Headache   . Hyperlipidemia   . Hypertension   . IBS (irritable bowel syndrome)   . Kidney stones   . Lymphocytic colitis   . Myocardial infarct ([place])   . Neuropathy, peripheral   . PONV (postoperative nausea and vomiting)   . Sleep apnea    no CPAP  . Ulcer   .  Urinary tract infection     Medications:  No PTA anticoagulation listed.  Assessment: Pharmacy consulted to dose enoxaparin (Lovenox) for VTE treatment in this COVID positive patient with elevated d-dimer (3.97>11.13).  BMI greater than 30 and estimated creatinine clearance is greater than 30 ml/min.  Goal of Therapy:  Anti-Xa level 0.6-1 units/ml 4hrs after LMWH dose given Monitor platelets by anticoagulation protocol: Yes   Plan:  Lovenox 1mg /kg subq every 12 hours Monitor CBC, s/s bleeding  Savier Trickett P. Legrand Como, PharmD, [place] Please utilize Amion for appropriate phone number to reach the unit pharmacist (Jessie) 08/01/2020 8:15 AM

## 2020-08-01 NOTE — Plan of Care (Signed)
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
## 2020-08-01 NOTE — Evaluation (Signed)
Physical Therapy Evaluation Patient Details Name: [name] MRN: [id number] DOB: [birth year] Today's Date: 08/01/2020   History of Present Illness  70 year old female who has not been vaccinated against COVID-19 with a past medical history of OSA not on CPAP, NICM, chronic systolic heart failure, high PVC burden s/p radiofrequency catheter ablation with [name] on 07/18/2018, hypertension who presented to the ED with 2 days of fever, cough, dyspnea. Pt admitted for  Acute hypoxic respiratory failure and Severe Sepsis without septic shock secondary to COVID-19  Clinical Impression  Pt admitted with above diagnosis. Pt currently with functional limitations due to the deficits listed below (see PT Problem List). Pt will benefit from skilled PT to increase their independence and safety with mobility to allow discharge to the venue listed below.  Pt pleasant and motivated.  Pt eager to be OOB and agreeable to sit up in recliner. Pt only min/guard assist over to recliner however remains on 15L HFNC and 15 L NRB.  Pt hopeful to d/c to SNF prior to d/c home as she states she lives with grandson however he will not help her.  If oxygen demand decreases and pt tolerates mobility better, may be able to d/c home however will recommend SNF at this time.      Follow Up Recommendations SNF    Equipment Recommendations  None recommended by PT    Recommendations for Other Services       Precautions / Restrictions Precautions Precautions: Fall Precaution Comments: currently on 15L NRB and 15 L HFNC Restrictions Weight Bearing Restrictions: Yes Other Position/Activity Restrictions: pt with left wrist brace, states she is NWB to wrist      Mobility  Bed Mobility Overal bed mobility: Needs Assistance Bed Mobility: Supine to Sit     Supine to sit: Min guard;HOB elevated     General bed mobility comments: provided a hand for pt to self assist    Transfers Overall transfer level: Needs  assistance Equipment used: None Transfers: Sit to/from Stand Sit to Stand: Min guard         General transfer comment: min/guard for safety, assist for line management, pt remained on 15 L HFNC and 15 L NRB, SpO2 91% with transfer and improved to 95% upon resting in recliner  Ambulation/Gait             General Gait Details: deferred today (pt reports just back from imaging and was only on one source of oxygen, nervous about drop of saturations and breathing with too much activity)  Stairs            Wheelchair Mobility    Modified Rankin (Stroke Patients Only)       Balance Overall balance assessment: No apparent balance deficits (not formally assessed)                                           Pertinent Vitals/Pain Pain Assessment: No/denies pain    Home Living Family/patient expects to be discharged to:: Private residence Living Arrangements:  (grandson however she would like him out of the house)   Type of Home: House Home Access: Stairs to enter Entrance Stairs-Rails: Right Entrance Stairs-Number of Steps: 5 or 3 steps Home Layout: One level Home Equipment: Walker - 2 wheels;Wheelchair - manual;Cane - single point;Bedside commode Additional Comments: has DME from her deceased mother    Prior Function Level  of Independence: Independent               Hand Dominance        Extremity/Trunk Assessment   Upper Extremity Assessment Upper Extremity Assessment: LUE deficits/detail LUE Deficits / Details: brace on left wrist    Lower Extremity Assessment Lower Extremity Assessment: Generalized weakness    Cervical / Trunk Assessment Cervical / Trunk Assessment: Normal  Communication   Communication: No difficulties  Cognition Arousal/Alertness: Awake/alert Behavior During Therapy: WFL for tasks assessed/performed Overall Cognitive Status: Within Functional Limits for tasks assessed                                         General Comments      Exercises     Assessment/Plan    PT Assessment Patient needs continued PT services  PT Problem List Decreased strength;Decreased mobility;Decreased activity tolerance;Decreased knowledge of use of DME;Cardiopulmonary status limiting activity       PT Treatment Interventions DME instruction;Gait training;Balance training;Therapeutic exercise;Functional mobility training;Therapeutic activities;Patient/family education    PT Goals (Current goals can be found in the Care Plan section)  Acute Rehab PT Goals PT Goal Formulation: With patient Time For Goal Achievement: 08/15/20 Potential to Achieve Goals: Good    Frequency Min 2X/week   Barriers to discharge        Co-evaluation               AM-PAC PT "6 Clicks" Mobility  Outcome Measure Help needed turning from your back to your side while in a flat bed without using bedrails?: A Little Help needed moving from lying on your back to sitting on the side of a flat bed without using bedrails?: A Little Help needed moving to and from a bed to a chair (including a wheelchair)?: A Little Help needed standing up from a chair using your arms (e.g., wheelchair or bedside chair)?: A Little Help needed to walk in hospital room?: A Lot Help needed climbing 3-5 steps with a railing? : A Lot 6 Click Score: 16    End of Session Equipment Utilized During Treatment: Oxygen Activity Tolerance: Patient tolerated treatment well Patient left: in chair;with call bell/phone within reach;with chair alarm set Nurse Communication: Mobility status PT Visit Diagnosis: Difficulty in walking, not elsewhere classified (R26.2)    Time: 1450-1510 PT Time Calculation (min) (ACUTE ONLY): 20 min   Charges:   PT Evaluation $PT Eval Low Complexity: 1 Low     Kati PT, DPT Acute Rehabilitation Services Pager: [phone] Office: [phone]  York Ram E 08/01/2020, 3:59 PM

## 2020-08-01 NOTE — Progress Notes (Signed)
   07/31/20 1921  Assess: MEWS Score  Temp 97.8 F (36.6 C)  BP (!) 142/78  Pulse Rate 83  Resp (!) 26  SpO2 (!) 84 %  O2 Device Non-rebreather Mask  O2 Flow Rate (L/min) 15 L/min  Assess: MEWS Score  MEWS Temp 0  MEWS Systolic 0  MEWS Pulse 0  MEWS RR 2  MEWS LOC 0  MEWS Score 2  MEWS Score Color Yellow  Assess: if the MEWS score is Yellow or Red  Were vital signs taken at a resting state? Yes  Focused Assessment No change from prior assessment  Early Detection of Sepsis Score *See Row Information* Low  MEWS guidelines implemented *See Row Information* Yes  Treat  MEWS Interventions Consulted Respiratory Therapy  Pain Scale 0-10  Pain Score 0  Take Vital Signs  Increase Vital Sign Frequency  Yellow: Q 2hr X 2 then Q 4hr X 2, if remains yellow, continue Q 4hrs  Notify: Charge Nurse/RN  Name of Charge Nurse/RN Notified Karrie Doffing, RN  Date Charge Nurse/RN Notified 07/31/20  Time Charge Nurse/RN Notified 2000

## 2020-08-01 NOTE — Progress Notes (Signed)
Pharmacy Antibiotic Note  [name] is a 70 y.o. female admitted on 07/31/2020 with acute respiratory failure from COVID PNA. Micro lab called with GPC in 4/8 bottles (2/4 sets) with BCID detecting Staph species.  Pharmacy has been consulted for vancomycin dosing.  Plan: Vancomycin 1750 mg iv once followed by 1250 mg iv q 24 hours Predicted AUC 485  F/U renal function, culture results, clinical course, and ID assessment  Height: 5\' 5"  (165.1 cm) Weight: 99.8 kg (220 lb) IBW/kg (Calculated) : 57  Temp (24hrs), Avg:98.7 F (37.1 C), Min:97.8 F (36.6 C), Max:99.8 F (37.7 C)  Recent Labs  Lab 07/31/20 1326 07/31/20 1505 07/31/20 2004 07/31/20 2025 08/01/20 0503  WBC 7.1  --  7.5  --  4.7  CREATININE 1.17*  --  1.05*  --  0.84  LATICACIDVEN 3.0* 3.0*  --  2.2*  --     Estimated Creatinine Clearance: 73.9 mL/min (by C-G formula based on SCr of 0.84 mg/dL).    Allergies  Allergen Reactions  . Penicillins Anaphylaxis and Swelling    Has patient had a PCN reaction causing immediate rash, facial/tongue/throat swelling, SOB or lightheadedness with hypotension: Yes Has patient had a PCN reaction causing severe rash involving mucus membranes or skin necrosis: No Has patient had a PCN reaction that required hospitalization: Yes Has patient had a PCN reaction occurring within the last 10 years: No    . Shellfish-Derived Products Anaphylaxis and Swelling  . Sulfa Antibiotics Anaphylaxis and Swelling  . Codeine Nausea Only  . Morphine Nausea And Vomiting  . Morphine And Related Nausea And Vomiting    Antimicrobials this admission: 1/29 vancomycin >>   Dose adjustments this admission:  Microbiology results: 1/28 BCx: GPC in 4/8 bottles, Staph species per BCID 1/28 COVID: +  Thank you for allowing pharmacy to be a part of this patient's care.  Ulice Dash D 08/01/2020 1:03 PM

## 2020-08-01 NOTE — Plan of Care (Signed)
  Problem: Education: Goal: Knowledge of risk factors and measures for prevention of condition will improve Outcome: Progressing   Problem: Coping: Goal: Psychosocial and spiritual needs will be supported Outcome: Progressing   Problem: Respiratory: Goal: Will maintain a patent airway Outcome: Progressing   Problem: Education: Goal: Knowledge of General Education information will improve Description: Including pain rating scale, medication(s)/side effects and non-pharmacologic comfort measures Outcome: Progressing   Problem: Pain Managment: Goal: General experience of comfort will improve Outcome: Progressing   Problem: Safety: Goal: Ability to remain free from injury will improve Outcome: Progressing   Problem: Skin Integrity: Goal: Risk for impaired skin integrity will decrease Outcome: Progressing
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
## 2020-08-01 NOTE — CV Procedure (Signed)
BLE venous duplex complete. Preliminary findings given to [facility], RN at 1120.  Results can be found under chart review under CV PROC. 08/01/2020 2:23 PM Ellice Boultinghouse RVT, RDMS

## 2020-08-02 DIAGNOSIS — U071 COVID-19: Secondary | ICD-10-CM | POA: Diagnosis not present

## 2020-08-02 DIAGNOSIS — J9601 Acute respiratory failure with hypoxia: Secondary | ICD-10-CM | POA: Diagnosis not present

## 2020-08-02 LAB — CBC WITH DIFFERENTIAL/PLATELET
Abs Immature Granulocytes: 0.46 10*3/uL — ABNORMAL HIGH (ref 0.00–0.07)
Basophils Absolute: 0 10*3/uL (ref 0.0–0.1)
Basophils Relative: 0 %
Eosinophils Absolute: 0 10*3/uL (ref 0.0–0.5)
Eosinophils Relative: 0 %
HCT: 37.5 % (ref 36.0–46.0)
Hemoglobin: 12.6 g/dL (ref 12.0–15.0)
Immature Granulocytes: 5 %
Lymphocytes Relative: 10 %
Lymphs Abs: 0.9 10*3/uL (ref 0.7–4.0)
MCH: 28 pg (ref 26.0–34.0)
MCHC: 33.6 g/dL (ref 30.0–36.0)
MCV: 83.3 fL (ref 80.0–100.0)
Monocytes Absolute: 0.6 10*3/uL (ref 0.1–1.0)
Monocytes Relative: 6 %
Neutro Abs: 7.8 10*3/uL — ABNORMAL HIGH (ref 1.7–7.7)
Neutrophils Relative %: 79 %
Platelets: 213 10*3/uL (ref 150–400)
RBC: 4.5 MIL/uL (ref 3.87–5.11)
RDW: 14.8 % (ref 11.5–15.5)
WBC: 9.8 10*3/uL (ref 4.0–10.5)
nRBC: 0.9 % — ABNORMAL HIGH (ref 0.0–0.2)

## 2020-08-02 LAB — C-REACTIVE PROTEIN: CRP: 1.3 mg/dL — ABNORMAL HIGH (ref <1.0)

## 2020-08-02 LAB — COMPREHENSIVE METABOLIC PANEL
ALT: 30 U/L (ref 0–44)
AST: 38 U/L (ref 15–41)
Albumin: 2.8 g/dL — ABNORMAL LOW (ref 3.5–5.0)
Alkaline Phosphatase: 85 U/L (ref 38–126)
Anion gap: 11 (ref 5–15)
BUN: 25 mg/dL — ABNORMAL HIGH (ref 8–23)
CO2: 20 mmol/L — ABNORMAL LOW (ref 22–32)
Calcium: 8.5 mg/dL — ABNORMAL LOW (ref 8.9–10.3)
Chloride: 110 mmol/L (ref 98–111)
Creatinine, Ser: 0.86 mg/dL (ref 0.44–1.00)
GFR, Estimated: >60 mL/min (ref >60)
Glucose, Bld: 130 mg/dL — ABNORMAL HIGH (ref 70–99)
Potassium: 3.2 mmol/L — ABNORMAL LOW (ref 3.5–5.1)
Sodium: 141 mmol/L (ref 135–145)
Total Bilirubin: 0.6 mg/dL (ref 0.3–1.2)
Total Protein: 6.1 g/dL — ABNORMAL LOW (ref 6.5–8.1)

## 2020-08-02 LAB — GLUCOSE, CAPILLARY
Glucose-Capillary: 108 mg/dL — ABNORMAL HIGH (ref 70–99)
Glucose-Capillary: 108 mg/dL — ABNORMAL HIGH (ref 70–99)
Glucose-Capillary: 107 mg/dL — ABNORMAL HIGH (ref 70–99)
Glucose-Capillary: 109 mg/dL — ABNORMAL HIGH (ref 70–99)

## 2020-08-02 LAB — D-DIMER, QUANTITATIVE: D-Dimer, Quant: 10.78 ug/mL-FEU — ABNORMAL HIGH (ref 0.00–0.50)

## 2020-08-02 LAB — FERRITIN: Ferritin: 323 ng/mL — ABNORMAL HIGH (ref 11–307)

## 2020-08-02 MED ORDER — POTASSIUM CHLORIDE CRYS ER 20 MEQ PO TBCR
40.0000 meq | EXTENDED_RELEASE_TABLET | Freq: Once | ORAL | Status: AC
  Administered 2020-08-02: 40 meq via ORAL
  Filled 2020-08-02: qty 2

## 2020-08-02 NOTE — Plan of Care (Signed)

## 2020-08-02 NOTE — Progress Notes (Addendum)
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         1.30 cm  LV E/e' medial:  11.1 LV IVS:        1.30 cm  LV e' lateral:   5.00 cm/s LVOT diam:     2.00 cm  LV E/e' lateral: 7.5 LVOT Area:     3.14 cm  LEFT ATRIUM         Index LA diam:    3.60 cm 1.75 cm/m  MITRAL VALVE MV Area (PHT): 3.91 cm    SHUNTS MV Decel Time: 194 msec    Systemic Diam: 2.00 cm MV E velocity: 37.50 cm/s MV Nancy Manuele velocity: 50.60 cm/s MV E/Novah Goza ratio:  0.74 Oswaldo Milian MD Electronically signed by Oswaldo Milian MD Signature Date/Time: 08/01/2020/1:20:13 PM    Final         Scheduled Meds: . aspirin EC  81 mg Oral Daily  . atorvastatin  40 mg Oral q1800  . busPIRone  10 mg Oral BID  . enoxaparin (LOVENOX) injection  100 mg Subcutaneous Q12H  .  gabapentin  200 mg Oral QHS  . insulin aspart  0-15 Units Subcutaneous TID WC  . insulin aspart  4 Units Subcutaneous TID WC  . insulin detemir  0.15 Units/kg Subcutaneous BID  . Ipratropium-Albuterol  1 puff Inhalation Q6H  . linagliptin  5 mg Oral Daily  . mouth rinse  15 mL Mouth Rinse BID  . methylPREDNISolone (SOLU-MEDROL) injection  50 mg Intravenous Q6H   Followed by  . [START ON 08/04/2020] methylPREDNISolone (SOLU-MEDROL) injection  50 mg Intravenous Q12H  . metoprolol succinate  12.5 mg Oral Daily  . pantoprazole  40 mg Oral Daily  . sodium chloride flush  3 mL Intravenous Q12H  . topiramate  50 mg Oral QHS   Continuous Infusions: . remdesivir 100 mg in NS 100 mL 100 mg (08/02/20 0926)  . vancomycin 1,250 mg (08/02/20 1328)     LOS: 2 days    Time spent: over 30 min 40 min critical care time due to AHRF 2/2 covid requiring HFNC and NRB   Fayrene Helper, MD [facility]   To contact the attending provider between 7A-7P or the covering provider during after hours 7P-7A, please log into the web site [email or web address] and access using universal [place] password for that web site. If you do not have the password, please call the hospital operator.  08/02/2020, 1:50 PM

## 2020-08-03 DIAGNOSIS — E1169 Type 2 diabetes mellitus with other specified complication: Secondary | ICD-10-CM

## 2020-08-03 DIAGNOSIS — J9601 Acute respiratory failure with hypoxia: Secondary | ICD-10-CM | POA: Diagnosis not present

## 2020-08-03 DIAGNOSIS — U071 COVID-19: Secondary | ICD-10-CM | POA: Diagnosis not present

## 2020-08-03 DIAGNOSIS — R7881 Bacteremia: Secondary | ICD-10-CM

## 2020-08-03 DIAGNOSIS — E669 Obesity, unspecified: Secondary | ICD-10-CM

## 2020-08-03 DIAGNOSIS — B957 Other staphylococcus as the cause of diseases classified elsewhere: Secondary | ICD-10-CM

## 2020-08-03 LAB — CBC WITH DIFFERENTIAL/PLATELET
Abs Immature Granulocytes: 0.61 10*3/uL — ABNORMAL HIGH (ref 0.00–0.07)
Basophils Absolute: 0 10*3/uL (ref 0.0–0.1)
Basophils Relative: 0 %
Eosinophils Absolute: 0.2 10*3/uL (ref 0.0–0.5)
Eosinophils Relative: 2 %
HCT: 38.8 % (ref 36.0–46.0)
Hemoglobin: 12.8 g/dL (ref 12.0–15.0)
Immature Granulocytes: 5 %
Lymphocytes Relative: 7 %
Lymphs Abs: 0.8 10*3/uL (ref 0.7–4.0)
MCH: 27.8 pg (ref 26.0–34.0)
MCHC: 33 g/dL (ref 30.0–36.0)
MCV: 84.2 fL (ref 80.0–100.0)
Monocytes Absolute: 0.6 10*3/uL (ref 0.1–1.0)
Monocytes Relative: 5 %
Neutro Abs: 9.4 10*3/uL — ABNORMAL HIGH (ref 1.7–7.7)
Neutrophils Relative %: 81 %
Platelets: 233 10*3/uL (ref 150–400)
RBC: 4.61 MIL/uL (ref 3.87–5.11)
RDW: 14.9 % (ref 11.5–15.5)
WBC: 11.7 10*3/uL — ABNORMAL HIGH (ref 4.0–10.5)
nRBC: 0.7 % — ABNORMAL HIGH (ref 0.0–0.2)

## 2020-08-03 LAB — COMPREHENSIVE METABOLIC PANEL
ALT: 28 U/L (ref 0–44)
AST: 36 U/L (ref 15–41)
Albumin: 2.7 g/dL — ABNORMAL LOW (ref 3.5–5.0)
Alkaline Phosphatase: 85 U/L (ref 38–126)
Anion gap: 11 (ref 5–15)
BUN: 31 mg/dL — ABNORMAL HIGH (ref 8–23)
CO2: 21 mmol/L — ABNORMAL LOW (ref 22–32)
Calcium: 8.6 mg/dL — ABNORMAL LOW (ref 8.9–10.3)
Chloride: 108 mmol/L (ref 98–111)
Creatinine, Ser: 0.77 mg/dL (ref 0.44–1.00)
GFR, Estimated: >60 mL/min (ref >60)
Glucose, Bld: 108 mg/dL — ABNORMAL HIGH (ref 70–99)
Potassium: 3.4 mmol/L — ABNORMAL LOW (ref 3.5–5.1)
Sodium: 140 mmol/L (ref 135–145)
Total Bilirubin: 1 mg/dL (ref 0.3–1.2)
Total Protein: 6 g/dL — ABNORMAL LOW (ref 6.5–8.1)

## 2020-08-03 LAB — GLUCOSE, CAPILLARY
Glucose-Capillary: 106 mg/dL — ABNORMAL HIGH (ref 70–99)
Glucose-Capillary: 136 mg/dL — ABNORMAL HIGH (ref 70–99)
Glucose-Capillary: 83 mg/dL (ref 70–99)

## 2020-08-03 LAB — CULTURE, BLOOD (ROUTINE X 2): Special Requests: ADEQUATE

## 2020-08-03 LAB — D-DIMER, QUANTITATIVE: D-Dimer, Quant: 7.9 ug/mL-FEU — ABNORMAL HIGH (ref 0.00–0.50)

## 2020-08-03 LAB — MAGNESIUM: Magnesium: 2 mg/dL (ref 1.7–2.4)

## 2020-08-03 LAB — PHOSPHORUS: Phosphorus: 2.6 mg/dL (ref 2.5–4.6)

## 2020-08-03 LAB — C-REACTIVE PROTEIN: CRP: 0.7 mg/dL (ref <1.0)

## 2020-08-03 LAB — FERRITIN: Ferritin: 237 ng/mL (ref 11–307)

## 2020-08-03 MED ORDER — POTASSIUM CHLORIDE CRYS ER 20 MEQ PO TBCR
40.0000 meq | EXTENDED_RELEASE_TABLET | Freq: Once | ORAL | Status: AC
  Administered 2020-08-03: 40 meq via ORAL
  Filled 2020-08-03: qty 2

## 2020-08-03 MED ORDER — IPRATROPIUM-ALBUTEROL 20-100 MCG/ACT IN AERS
1.0000 | INHALATION_SPRAY | Freq: Three times a day (TID) | RESPIRATORY_TRACT | Status: DC
  Administered 2020-08-04 – 2020-09-04 (×92): 1 via RESPIRATORY_TRACT
  Filled 2020-08-03 (×2): qty 4
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
## 2020-08-03 NOTE — Care Management Important Message (Signed)
Important Message  Patient Details IM Letter placed in Patient's door Caddy. Name: KALILA ADKISON MRN: [id number] Date of Birth: 1950-10-20   Medicare Important Message Given:  Yes     [name] 08/03/2020, 1:21 PM

## 2020-08-03 NOTE — Progress Notes (Signed)
PT is preparing to transfer to bedside toilet- RT did not manipulate 02 at this time.

## 2020-08-03 NOTE — Consult Note (Signed)
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 [place] [address] [place], [place] [address]    Report Status 08/03/2020 FINAL  Final  Culture, blood (x 2)     Status: None (Preliminary result)   Collection Time: 07/31/20  8:05 PM   Specimen: BLOOD  RIGHT HAND  Result Value Ref Range Status   Specimen Description   Final    BLOOD RIGHT HAND Performed at [facility] [address], [place], [place] [address]    Special Requests   Final    BOTTLES DRAWN AEROBIC AND ANAEROBIC Blood Culture adequate volume Performed at [facility] [address], [place], [place] [address]    Culture   Final    NO GROWTH 3 DAYS Performed at [facility], [place] [address], [place], [place] [address]    Report Status PENDING  Incomplete  Culture, blood (x 2)     Status: None (Preliminary result)   Collection Time: 07/31/20  8:05 PM   Specimen: BLOOD  Result Value Ref Range Status   Specimen Description   Final    BLOOD LEFT ANTECUBITAL Performed at [facility] [address], [place], [place] [address]    Special Requests   Final    BOTTLES DRAWN AEROBIC AND ANAEROBIC Blood Culture adequate volume Performed at [facility] [address] [place], [place] [address]    Culture   Final    NO GROWTH 3 DAYS Performed at [facility], [place] [address], [place], [place] [address]    Report Status PENDING  Incomplete    Microbiology: Recent Results (from the past 240 hour(s))  SARS Coronavirus 2 by RT PCR (hospital order, performed in [name] hospital lab) Nasopharyngeal Nasopharyngeal Swab     Status: Abnormal   Collection Time: 07/31/20  1:26 PM   Specimen: Nasopharyngeal Swab  Result Value Ref Range Status   SARS Coronavirus 2 POSITIVE (A) NEGATIVE Final    Comment: RESULT CALLED TO, READ BACK BY AND VERIFIED WITH: LEONARD,S. RN @1539  ON 01.28.2022 BY COHEN,K (NOTE) SARS-CoV-2 target nucleic acids are DETECTED  SARS-CoV-2 RNA is generally detectable in upper respiratory specimens  during the acute phase of infection.  Positive results are indicative  of the presence of the identified virus, but do not rule out bacterial infection or co-infection  with other pathogens not detected by the test.  Clinical correlation with patient history and  other diagnostic information is necessary to determine patient infection status.  The expected result is negative.  Fact Sheet for Patients:   StrictlyIdeas.no   Fact Sheet for Healthcare Providers:   BankingDealers.co.za    This test is not yet approved or cleared by the [place] FDA and  has been authorized for detection and/or diagnosis of SARS-CoV-2 by FDA under an Emergency Use Authorization (EUA).  This EUA will remain in effect (meani ng this test can be used) for the duration of  the COVID-19 declaration under Section 564(b)(1) of the Act, 21 U.S.C. section 360-bbb-3(b)(1), unless the authorization is terminated or revoked sooner.  Performed at [facility], [place] [address], [place], [place] [address]   Blood Culture (routine x 2)     Status: Abnormal   Collection Time: 07/31/20  1:26 PM   Specimen: BLOOD RIGHT FOREARM  Result Value Ref Range Status   Specimen Description   Final    BLOOD RIGHT FOREARM Performed at [facility] [address], [place], [place] [address]    Special Requests   Final    BOTTLES DRAWN AEROBIC AND ANAEROBIC Blood Culture adequate volume Performed at [facility] [address], [place],  [address]    Culture  Setup Time   Final  GRAM POSITIVE COCCI IN CLUSTERS IN BOTH AEROBIC AND ANAEROBIC BOTTLES CRITICAL RESULT CALLED TO, READ BACK BY AND VERIFIED WITH: PHARM D A.WILLIAMSON AT 1237 ON 08/01/2020 BY T.SAAD Performed at [facility], [place] [address], [place], [place] [address]    Culture STAPHYLOCOCCUS HOMINIS (A)  Final   Report Status 08/03/2020 FINAL  Final   Organism ID, Bacteria STAPHYLOCOCCUS HOMINIS  Final      Susceptibility   Staphylococcus hominis - MIC*    CIPROFLOXACIN 2 INTERMEDIATE Intermediate      ERYTHROMYCIN >=8 RESISTANT Resistant     GENTAMICIN <=0.5 SENSITIVE Sensitive     OXACILLIN >=4 RESISTANT Resistant     TETRACYCLINE >=16 RESISTANT Resistant     VANCOMYCIN 1 SENSITIVE Sensitive     TRIMETH/SULFA <=10 SENSITIVE Sensitive     CLINDAMYCIN RESISTANT Resistant     RIFAMPIN <=0.5 SENSITIVE Sensitive     Inducible Clindamycin POSITIVE Resistant     * STAPHYLOCOCCUS HOMINIS  Blood Culture ID Panel (Reflexed)     Status: Abnormal   Collection Time: 07/31/20  1:26 PM  Result Value Ref Range Status   Enterococcus faecalis NOT DETECTED NOT DETECTED Final   Enterococcus Faecium NOT DETECTED NOT DETECTED Final   Listeria monocytogenes NOT DETECTED NOT DETECTED Final   Staphylococcus species DETECTED (A) NOT DETECTED Final    Comment: CRITICAL RESULT CALLED TO, READ BACK BY AND VERIFIED WITH: PHARMD A.WILLIAMSON AT 1237 ON 08/01/2020 BY T.SAAD    Staphylococcus aureus (BCID) NOT DETECTED NOT DETECTED Final   Staphylococcus epidermidis NOT DETECTED NOT DETECTED Final   Staphylococcus lugdunensis NOT DETECTED NOT DETECTED Final   Streptococcus species NOT DETECTED NOT DETECTED Final   Streptococcus agalactiae NOT DETECTED NOT DETECTED Final   Streptococcus pneumoniae NOT DETECTED NOT DETECTED Final   Streptococcus pyogenes NOT DETECTED NOT DETECTED Final   A.calcoaceticus-baumannii NOT DETECTED NOT DETECTED Final   Bacteroides fragilis NOT DETECTED NOT DETECTED Final   Enterobacterales NOT DETECTED NOT DETECTED Final   Enterobacter cloacae complex NOT DETECTED NOT DETECTED Final   Escherichia coli NOT DETECTED NOT DETECTED Final   Klebsiella aerogenes NOT DETECTED NOT DETECTED Final   Klebsiella oxytoca NOT DETECTED NOT DETECTED Final   Klebsiella pneumoniae NOT DETECTED NOT DETECTED Final   Proteus species NOT DETECTED NOT DETECTED Final   Salmonella species NOT DETECTED NOT DETECTED Final   Serratia marcescens NOT DETECTED NOT DETECTED Final   Haemophilus influenzae NOT  DETECTED NOT DETECTED Final   Neisseria meningitidis NOT DETECTED NOT DETECTED Final   Pseudomonas aeruginosa NOT DETECTED NOT DETECTED Final   Stenotrophomonas maltophilia NOT DETECTED NOT DETECTED Final   Candida albicans NOT DETECTED NOT DETECTED Final   Candida auris NOT DETECTED NOT DETECTED Final   Candida glabrata NOT DETECTED NOT DETECTED Final   Candida krusei NOT DETECTED NOT DETECTED Final   Candida parapsilosis NOT DETECTED NOT DETECTED Final   Candida tropicalis NOT DETECTED NOT DETECTED Final   Cryptococcus neoformans/gattii NOT DETECTED NOT DETECTED Final    Comment: Performed at [facility], [address] [place], [place] [address]  Blood Culture (routine x 2)     Status: Abnormal   Collection Time: 07/31/20  1:30 PM   Specimen: BLOOD RIGHT HAND  Result Value Ref Range Status   Specimen Description   Final    BLOOD RIGHT HAND Performed at [facility] [address], [place], [place] [address]    Special Requests   Final  BOTTLES DRAWN AEROBIC AND ANAEROBIC Blood Culture results may not be optimal due to an inadequate volume of blood received in culture bottles Performed at [facility], [place] [address], [place], [place] [address]    Culture  Setup Time   Final    GRAM POSITIVE COCCI IN CLUSTERS IN BOTH AEROBIC AND ANAEROBIC BOTTLES CRITICAL VALUE NOTED.  VALUE IS CONSISTENT WITH PREVIOUSLY REPORTED AND CALLED VALUE.    Culture (A)  Final    STAPHYLOCOCCUS HOMINIS SUSCEPTIBILITIES PERFORMED ON PREVIOUS CULTURE WITHIN THE LAST 5 DAYS. Performed at [facility], [place] [address] [place], [place] [address]    Report Status 08/03/2020 FINAL  Final  Culture, blood (x 2)     Status: None (Preliminary result)   Collection Time: 07/31/20  8:05 PM   Specimen: BLOOD RIGHT HAND  Result Value Ref Range Status   Specimen Description   Final    BLOOD RIGHT HAND Performed at [facility]  [address] [place], [place] [address]    Special Requests   Final    BOTTLES DRAWN AEROBIC AND ANAEROBIC Blood Culture adequate volume Performed at [facility] [address], [place], [place] [address]    Culture   Final    NO GROWTH 3 DAYS Performed at [facility], [place] [address], [place], [place] [address]    Report Status PENDING  Incomplete  Culture, blood (x 2)     Status: None (Preliminary result)   Collection Time: 07/31/20  8:05 PM   Specimen: BLOOD  Result Value Ref Range Status   Specimen Description   Final    BLOOD LEFT ANTECUBITAL Performed at [facility] [address] [place], [place] [address]    Special Requests   Final    BOTTLES DRAWN AEROBIC AND ANAEROBIC Blood Culture adequate volume Performed at [facility] [address], [place], [place] [address]    Culture   Final    NO GROWTH 3 DAYS Performed at [facility], [place] [address], [place], [place] [address]    Report Status PENDING  Incomplete    Radiographs and labs were personally reviewed by me.   Bobby Rumpf, MD [facility] [phone] 08/03/2020, 1:58 PM

## 2020-08-04 DIAGNOSIS — U071 COVID-19: Secondary | ICD-10-CM | POA: Diagnosis not present

## 2020-08-04 DIAGNOSIS — E1169 Type 2 diabetes mellitus with other specified complication: Secondary | ICD-10-CM | POA: Diagnosis not present

## 2020-08-04 DIAGNOSIS — T7401XA Adult neglect or abandonment, confirmed, initial encounter: Secondary | ICD-10-CM

## 2020-08-04 DIAGNOSIS — J9601 Acute respiratory failure with hypoxia: Secondary | ICD-10-CM | POA: Diagnosis not present

## 2020-08-04 DIAGNOSIS — E669 Obesity, unspecified: Secondary | ICD-10-CM | POA: Diagnosis not present

## 2020-08-04 LAB — CBC WITH DIFFERENTIAL/PLATELET
Abs Immature Granulocytes: 0.71 10*3/uL — ABNORMAL HIGH (ref 0.00–0.07)
Basophils Absolute: 0 10*3/uL (ref 0.0–0.1)
Basophils Relative: 0 %
Eosinophils Absolute: 0 10*3/uL (ref 0.0–0.5)
Eosinophils Relative: 0 %
HCT: 40 % (ref 36.0–46.0)
Hemoglobin: 13.1 g/dL (ref 12.0–15.0)
Immature Granulocytes: 6 %
Lymphocytes Relative: 6 %
Lymphs Abs: 0.8 10*3/uL (ref 0.7–4.0)
MCH: 27.5 pg (ref 26.0–34.0)
MCHC: 32.8 g/dL (ref 30.0–36.0)
MCV: 83.9 fL (ref 80.0–100.0)
Monocytes Absolute: 0.6 10*3/uL (ref 0.1–1.0)
Monocytes Relative: 5 %
Neutro Abs: 10.4 10*3/uL — ABNORMAL HIGH (ref 1.7–7.7)
Neutrophils Relative %: 83 %
Platelets: 261 10*3/uL (ref 150–400)
RBC: 4.77 MIL/uL (ref 3.87–5.11)
RDW: 14.8 % (ref 11.5–15.5)
WBC: 12.4 10*3/uL — ABNORMAL HIGH (ref 4.0–10.5)
nRBC: 0.6 % — ABNORMAL HIGH (ref 0.0–0.2)

## 2020-08-04 LAB — COMPREHENSIVE METABOLIC PANEL
ALT: 34 U/L (ref 0–44)
AST: 43 U/L — ABNORMAL HIGH (ref 15–41)
Albumin: 2.9 g/dL — ABNORMAL LOW (ref 3.5–5.0)
Alkaline Phosphatase: 84 U/L (ref 38–126)
Anion gap: 10 (ref 5–15)
BUN: 29 mg/dL — ABNORMAL HIGH (ref 8–23)
CO2: 21 mmol/L — ABNORMAL LOW (ref 22–32)
Calcium: 8.8 mg/dL — ABNORMAL LOW (ref 8.9–10.3)
Chloride: 112 mmol/L — ABNORMAL HIGH (ref 98–111)
Creatinine, Ser: 0.79 mg/dL (ref 0.44–1.00)
GFR, Estimated: >60 mL/min (ref >60)
Glucose, Bld: 118 mg/dL — ABNORMAL HIGH (ref 70–99)
Potassium: 3.8 mmol/L (ref 3.5–5.1)
Sodium: 143 mmol/L (ref 135–145)
Total Bilirubin: 0.7 mg/dL (ref 0.3–1.2)
Total Protein: 6 g/dL — ABNORMAL LOW (ref 6.5–8.1)

## 2020-08-04 LAB — C-REACTIVE PROTEIN: CRP: 0.6 mg/dL (ref <1.0)

## 2020-08-04 LAB — GLUCOSE, CAPILLARY
Glucose-Capillary: 148 mg/dL — ABNORMAL HIGH (ref 70–99)
Glucose-Capillary: 109 mg/dL — ABNORMAL HIGH (ref 70–99)
Glucose-Capillary: 98 mg/dL (ref 70–99)
Glucose-Capillary: 95 mg/dL (ref 70–99)
Glucose-Capillary: 126 mg/dL — ABNORMAL HIGH (ref 70–99)

## 2020-08-04 LAB — FERRITIN: Ferritin: 189 ng/mL (ref 11–307)

## 2020-08-04 LAB — D-DIMER, QUANTITATIVE: D-Dimer, Quant: 5.78 ug/mL-FEU — ABNORMAL HIGH (ref 0.00–0.50)

## 2020-08-04 MED ORDER — PHENOL 1.4 % MT LIQD
1.0000 | OROMUCOSAL | Status: DC | PRN
  Filled 2020-08-04 (×2): qty 177

## 2020-08-04 MED ORDER — PHENOL 1.4 % MT LIQD: 1.0000 | OROMUCOSAL | Status: DC | PRN
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
## 2020-08-04 NOTE — Progress Notes (Signed)
Added sterile water to HFNC 02 system- uneventful.

## 2020-08-04 NOTE — Progress Notes (Signed)
ANTICOAGULATION CONSULT NOTE - Initial Consult  Pharmacy Consult for Lovenox Indication: VTE treatment  Allergies  Allergen Reactions  . Penicillins Anaphylaxis and Swelling    Has patient had a PCN reaction causing immediate rash, facial/tongue/throat swelling, SOB or lightheadedness with hypotension: Yes Has patient had a PCN reaction causing severe rash involving mucus membranes or skin necrosis: No Has patient had a PCN reaction that required hospitalization: Yes Has patient had a PCN reaction occurring within the last 10 years: No    . Shellfish-Derived Products Anaphylaxis and Swelling  . Sulfa Antibiotics Anaphylaxis and Swelling  . Codeine Nausea Only  . Morphine Nausea And Vomiting  . Morphine And Related Nausea And Vomiting    Patient Measurements: Height: 5\' 5"  (165.1 cm) Weight: 99.8 kg (220 lb) IBW/kg (Calculated) : 57  Vital Signs: Temp: 98.6 F (37 C) (02/01 0500) Temp Source: Oral (02/01 0500) BP: 136/75 (02/01 0500) Pulse Rate: 71 (02/01 0748)  Labs: Recent Labs    08/02/20 0455 08/03/20 0420 08/04/20 0432  HGB 12.6 12.8 13.1  HCT 37.5 38.8 40.0  PLT 213 233 261  CREATININE 0.86 0.77 0.79    Estimated Creatinine Clearance: 77.6 mL/min (by C-G formula based on SCr of 0.79 mg/dL).   Medical History: Past Medical History:  Diagnosis Date  . Adenomatous colon polyp 2006  . Arrhythmia    Heart  . Arthritis   . Asthma   . Chronic headaches   . Complication of anesthesia   . Diabetes mellitus type II, controlled ([place])    Diet  . Gastric polyp    hyperplastic  . GERD (gastroesophageal reflux disease)   . Headache   . Hyperlipidemia   . Hypertension   . IBS (irritable bowel syndrome)   . Kidney stones   . Lymphocytic colitis   . Myocardial infarct ([place])   . Neuropathy, peripheral   . PONV (postoperative nausea and vomiting)   . Sleep apnea    no CPAP  . Ulcer   . Urinary tract infection     Medications:  No PTA anticoagulation  listed.  Assessment: Pharmacy consulted to dose enoxaparin (Lovenox) for VTE treatment in this COVID positive patient with acute DVT.   Today, 08/04/20  CBC: Hgb & Plt both WNL & stable  SCr 0.8, CrCl ~75 mL/min. Stable  D-dimer trending down: 11.13 > 5.78   Plan:   Continue enoxaparin 100 mg (1 mg/kg) subQ q12h  Follow CBC, renal function  Follow along for eventual transition to PO anticoagulation  Lenis Noon, PharmD 08/04/20 10:15 AM

## 2020-08-04 NOTE — Progress Notes (Signed)
Added sterile water to hfnc 02 system - uneventful.

## 2020-08-04 NOTE — Progress Notes (Signed)
Physical Therapy Treatment Patient Details Name: JOELEEN Daniel MRN: [id number] DOB: 1951-04-02 Today's Date: 08/04/2020    History of Present Illness 70 year old female who has not been vaccinated against COVID-19 with a past medical history of OSA not on CPAP, NICM, chronic systolic heart failure, high PVC burden s/p radiofrequency catheter ablation with [name] on 07/18/2018, hypertension who presented to the ED with 2 days of fever, cough, dyspnea. Pt admitted for  Acute hypoxic respiratory failure and Severe Sepsis without septic shock secondary to COVID-19    PT Comments    Pt feeling weak today however agreeable to try to mobilize with encouragement.  Pt requested BSC so first assisted to [name] and pt required assist for pericare.  Pt then transferred to recliner.  Pt on 15L NRB only upon entering room, so discussed pt performance and saturations with RN upon end of session.   Follow Up Recommendations  SNF     Equipment Recommendations  None recommended by PT    Recommendations for Other Services       Precautions / Restrictions Precautions Precautions: Fall Precaution Comments: found pt in room with only 15L NRB - RN notified Restrictions Other Position/Activity Restrictions: pt with left wrist brace, states she is NWB to wrist    Mobility  Bed Mobility Overal bed mobility: Needs Assistance Bed Mobility: Supine to Sit     Supine to sit: Min guard;HOB elevated     General bed mobility comments: provided a hand for pt to self assist; SpO2 94% sitting EOB with 15L NRB (only had on mask upon arrival to room)  Transfers Overall transfer level: Needs assistance Equipment used: None Transfers: Sit to/from [facility] Sit to Stand: Min guard Stand pivot transfers: Min guard       General transfer comment: min/guard for safety, assist for line management, pt remained on15 L NRB, SpO2 83% with transfer to [facility] and then briefly dropped to 79% upon sitting  in recliner however improved to 90% upon resting in recliner  Ambulation/Gait             General Gait Details: pt felt too weak today   Stairs             Wheelchair Mobility    Modified Rankin (Stroke Patients Only)       Balance                                            Cognition Arousal/Alertness: Awake/alert Behavior During Therapy: WFL for tasks assessed/performed Overall Cognitive Status: Within Functional Limits for tasks assessed                                        Exercises      General Comments        Pertinent Vitals/Pain Pain Assessment: No/denies pain    Home Living                      Prior Function            PT Goals (current goals can now be found in the care plan section) Progress towards PT goals: Progressing toward goals    Frequency    Min 2X/week      PT Plan Current plan remains  appropriate    Co-evaluation              AM-PAC PT "6 Clicks" Mobility   Outcome Measure  Help needed turning from your back to your side while in a flat bed without using bedrails?: A Little Help needed moving from lying on your back to sitting on the side of a flat bed without using bedrails?: A Little Help needed moving to and from a bed to a chair (including a wheelchair)?: A Little Help needed standing up from a chair using your arms (e.g., wheelchair or bedside chair)?: A Little Help needed to walk in hospital room?: A Lot Help needed climbing 3-5 steps with a railing? : A Lot 6 Click Score: 16    End of Session Equipment Utilized During Treatment: Oxygen Activity Tolerance: Patient tolerated treatment well Patient left: in chair;with call bell/phone within reach;with chair alarm set Nurse Communication: Mobility status PT Visit Diagnosis: Difficulty in walking, not elsewhere classified (R26.2)     Time: 9379-0240 PT Time Calculation (min) (ACUTE ONLY): 27  min  Charges:  $Therapeutic Activity: 23-37 mins                    Jannette Spanner PT, DPT Acute Rehabilitation Services Pager: [phone] Office: [place] 08/04/2020, 1:41 PM

## 2020-08-04 NOTE — Progress Notes (Signed)
Clinical/Bedside Swallow Evaluation Patient Details  Name: [name] MRN: [id number] Date of Birth: 11-Jun-1951  Today's Date: 08/04/2020 Time: SLP Start Time (ACUTE ONLY): 0263 SLP Stop Time (ACUTE ONLY): 1850 SLP Time Calculation (min) (ACUTE ONLY): 20 min  Past Medical History:  Past Medical History:  Diagnosis Date   Adenomatous colon polyp 2006   Arrhythmia    Heart   Arthritis    Asthma    Chronic headaches    Complication of anesthesia    Diabetes mellitus type II, controlled (HCC)    Diet   Gastric polyp    hyperplastic   GERD (gastroesophageal reflux disease)    Headache    Hyperlipidemia    Hypertension    IBS (irritable bowel syndrome)    Kidney stones    Lymphocytic colitis    Myocardial infarct (HCC)    Neuropathy, peripheral    PONV (postoperative nausea and vomiting)    Sleep apnea    no CPAP   Ulcer    Urinary tract infection    Past Surgical History:  Past Surgical History:  Procedure Laterality Date   ABDOMINAL HYSTERECTOMY     ABDOMINAL HYSTERECTOMY     BACK SURGERY     BLADDER REPAIR     x2   CARDIAC CATHETERIZATION  04/2008   CHOLECYSTECTOMY  2000   LEFT HEART CATHETERIZATION WITH CORONARY ANGIOGRAM N/A 08/08/2012   Procedure: LEFT HEART CATHETERIZATION WITH CORONARY ANGIOGRAM;  Surgeon: Lorretta Harp, MD;  Location: [facility] CATH LAB;  Service: Cardiovascular;  Laterality: N/A;   RIGHT/LEFT HEART CATH AND CORONARY ANGIOGRAPHY N/A 04/30/2018   Procedure: RIGHT/LEFT HEART CATH AND CORONARY ANGIOGRAPHY;  Surgeon: Lorretta Harp, MD;  Location: [place] CV LAB;  Service: Cardiovascular;  Laterality: N/A;   V TACH ABLATION N/A 07/19/2018   Procedure: Stephanie Coup ABLATION;  Surgeon: Thompson Grayer, MD;  Location: [place] CV LAB;  Service: Cardiovascular;  Laterality: N/A;   HPI:  pt is a 70 yo female adm to [facility] - not vaccinated which she reports is due to allergies to prior vaccines, second hand smoke from spouse (65 years), family during  childhood.   MD ordered a swallow evaluation.  Pt denies dysphagia - but admits to GERD.   Assessment / Plan / Recommendation Clinical Impression  Pt with functional oropharyngeal swallow ability with po she did accept.  She consumed only minimal po of Ensure "shake" and water.  She dropped her oxygen saturation while setting up her meal tray.  Pt did not pass 3 ounce [place] water challenge due to needing rest break before consuming all of it *approx 2 ounces consumed.  But no indication of aspiration with po.  Her speech is clear and she denies difficulty with masticating -  SLP educated pt to general aspiration precautions given her dyspnea with effort.  Advised to consume cohesive foods, start intake with liquids - prefer water, rest if short of breath.  No SLP follow up indicated as pt has been educated to aspiration mitigation strategies with her work of breathing.  SLP to sign off.  Thanks.    SLP Visit Diagnosis: Dysphagia, unspecified (R13.10)    Aspiration Risk  Mild aspiration risk    Diet Recommendation Regular;Thin liquid   Liquid Administration via: Straw;Cup Medication Administration: Whole meds with liquid Supervision: Patient able to self feed Compensations: Slow rate;Small sips/bites Postural Changes: Seated upright at 90 degrees;Remain upright for at least 30 minutes after po intake    Other  Recommendations  Oral Care Recommendations: Oral care BID Other Recommendations: Other (Comment)   Follow up Recommendations None      Frequency and Duration   na         Prognosis Prognosis for Safe Diet Advancement: Good      Swallow Study   General Date of Onset: 08/04/20 HPI: pt is a 70 yo female adm to [facility] - not vaccinated which she reports is due to allergies to prior vaccines, second hand smoke from spouse (66 years), family during childhood.   MD ordered a swallow evaluation.  Pt denies dysphagia - but admits to GERD. Type of Study: Bedside Swallow Evaluation Diet  Prior to this Study: Regular;Thin liquids Temperature Spikes Noted: No Respiratory Status: Nasal cannula;Non-rebreather (HFNC) History of Recent Intubation: No Behavior/Cognition: Alert;Cooperative;Pleasant mood Oral Cavity Assessment: Within Functional Limits Oral Care Completed by SLP: No Oral Cavity - Dentition: Dentures, bottom;Adequate natural dentition Vision: Functional for self-feeding Self-Feeding Abilities: Needs set up Patient Positioning: Upright in bed Baseline Vocal Quality: Low vocal intensity Volitional Cough: Other (Comment) (did not ask pt to cough due to dyspnea) Volitional Swallow: Able to elicit    Oral/Motor/Sensory Function Overall Oral Motor/Sensory Function: Within functional limits   Ice Chips Ice chips: Not tested   Thin Liquid Thin Liquid: Within functional limits Presentation: Straw    Nectar Thick Nectar Thick Liquid: Within functional limits Presentation: Straw   Honey Thick Honey Thick Liquid: Not tested   Puree Puree: Not tested   Solid     Solid: Not tested      Macario Golds 08/04/2020,6:56 PM   Kathleen Lime, MS [facility] SLP Acute Rehab Services Office [phone] Pager [phone]
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
## 2020-08-05 DIAGNOSIS — U071 COVID-19: Secondary | ICD-10-CM | POA: Diagnosis not present

## 2020-08-05 DIAGNOSIS — J9601 Acute respiratory failure with hypoxia: Secondary | ICD-10-CM | POA: Diagnosis not present

## 2020-08-05 LAB — CBC WITH DIFFERENTIAL/PLATELET
Abs Immature Granulocytes: 0.7 10*3/uL — ABNORMAL HIGH (ref 0.00–0.07)
Basophils Absolute: 0 10*3/uL (ref 0.0–0.1)
Basophils Relative: 0 %
Eosinophils Absolute: 0.1 10*3/uL (ref 0.0–0.5)
Eosinophils Relative: 1 %
HCT: 40.6 % (ref 36.0–46.0)
Hemoglobin: 13.5 g/dL (ref 12.0–15.0)
Immature Granulocytes: 6 %
Lymphocytes Relative: 7 %
Lymphs Abs: 0.8 10*3/uL (ref 0.7–4.0)
MCH: 28.1 pg (ref 26.0–34.0)
MCHC: 33.3 g/dL (ref 30.0–36.0)
MCV: 84.4 fL (ref 80.0–100.0)
Monocytes Absolute: 0.4 10*3/uL (ref 0.1–1.0)
Monocytes Relative: 3 %
Neutro Abs: 9.2 10*3/uL — ABNORMAL HIGH (ref 1.7–7.7)
Neutrophils Relative %: 83 %
Platelets: 225 10*3/uL (ref 150–400)
RBC: 4.81 MIL/uL (ref 3.87–5.11)
RDW: 15 % (ref 11.5–15.5)
WBC: 11.2 10*3/uL — ABNORMAL HIGH (ref 4.0–10.5)
nRBC: 0.7 % — ABNORMAL HIGH (ref 0.0–0.2)

## 2020-08-05 LAB — GLUCOSE, CAPILLARY
Glucose-Capillary: 81 mg/dL (ref 70–99)
Glucose-Capillary: 90 mg/dL (ref 70–99)
Glucose-Capillary: 68 mg/dL — ABNORMAL LOW (ref 70–99)
Glucose-Capillary: 109 mg/dL — ABNORMAL HIGH (ref 70–99)

## 2020-08-05 LAB — CULTURE, BLOOD (ROUTINE X 2)
Culture: NO GROWTH
Culture: NO GROWTH
Special Requests: ADEQUATE
Special Requests: ADEQUATE

## 2020-08-05 LAB — COMPREHENSIVE METABOLIC PANEL
ALT: 31 U/L (ref 0–44)
AST: 32 U/L (ref 15–41)
Albumin: 2.8 g/dL — ABNORMAL LOW (ref 3.5–5.0)
Alkaline Phosphatase: 76 U/L (ref 38–126)
Anion gap: 9 (ref 5–15)
BUN: 33 mg/dL — ABNORMAL HIGH (ref 8–23)
CO2: 23 mmol/L (ref 22–32)
Calcium: 8.6 mg/dL — ABNORMAL LOW (ref 8.9–10.3)
Chloride: 110 mmol/L (ref 98–111)
Creatinine, Ser: 0.87 mg/dL (ref 0.44–1.00)
GFR, Estimated: >60 mL/min (ref >60)
Glucose, Bld: 84 mg/dL (ref 70–99)
Potassium: 3.3 mmol/L — ABNORMAL LOW (ref 3.5–5.1)
Sodium: 142 mmol/L (ref 135–145)
Total Bilirubin: 0.6 mg/dL (ref 0.3–1.2)
Total Protein: 5.6 g/dL — ABNORMAL LOW (ref 6.5–8.1)

## 2020-08-05 LAB — D-DIMER, QUANTITATIVE: D-Dimer, Quant: 3.41 ug/mL-FEU — ABNORMAL HIGH (ref 0.00–0.50)

## 2020-08-05 LAB — C-REACTIVE PROTEIN: CRP: 0.6 mg/dL (ref <1.0)

## 2020-08-05 LAB — FERRITIN: Ferritin: 142 ng/mL (ref 11–307)

## 2020-08-05 MED ORDER — POTASSIUM CHLORIDE CRYS ER 20 MEQ PO TBCR
40.0000 meq | EXTENDED_RELEASE_TABLET | Freq: Once | ORAL | Status: AC
  Administered 2020-08-05: 40 meq via ORAL
  Filled 2020-08-05: qty 2

## 2020-08-05 NOTE — Plan of Care (Signed)
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
## 2020-08-05 NOTE — Plan of Care (Signed)

## 2020-08-06 ENCOUNTER — Inpatient Hospital Stay (HOSPITAL_COMMUNITY): Payer: Medicare HMO

## 2020-08-06 DIAGNOSIS — I82462 Acute embolism and thrombosis of left calf muscular vein: Secondary | ICD-10-CM | POA: Diagnosis not present

## 2020-08-06 DIAGNOSIS — J9601 Acute respiratory failure with hypoxia: Secondary | ICD-10-CM | POA: Diagnosis not present

## 2020-08-06 DIAGNOSIS — U071 COVID-19: Secondary | ICD-10-CM | POA: Diagnosis not present

## 2020-08-06 LAB — CBC
HCT: 39.5 % (ref 36.0–46.0)
Hemoglobin: 13.1 g/dL (ref 12.0–15.0)
MCH: 27.6 pg (ref 26.0–34.0)
MCHC: 33.2 g/dL (ref 30.0–36.0)
MCV: 83.2 fL (ref 80.0–100.0)
Platelets: 211 10*3/uL (ref 150–400)
RBC: 4.75 MIL/uL (ref 3.87–5.11)
RDW: 14.9 % (ref 11.5–15.5)
WBC: 8.3 10*3/uL (ref 4.0–10.5)
nRBC: 0.5 % — ABNORMAL HIGH (ref 0.0–0.2)

## 2020-08-06 LAB — BASIC METABOLIC PANEL
Anion gap: 6 (ref 5–15)
BUN: 22 mg/dL (ref 8–23)
CO2: 24 mmol/L (ref 22–32)
Calcium: 8.4 mg/dL — ABNORMAL LOW (ref 8.9–10.3)
Chloride: 109 mmol/L (ref 98–111)
Creatinine, Ser: 0.77 mg/dL (ref 0.44–1.00)
GFR, Estimated: >60 mL/min (ref >60)
Glucose, Bld: 75 mg/dL (ref 70–99)
Potassium: 3.4 mmol/L — ABNORMAL LOW (ref 3.5–5.1)
Sodium: 139 mmol/L (ref 135–145)

## 2020-08-06 LAB — BLOOD GAS, ARTERIAL
Acid-base deficit: 0 mmol/L (ref 0.0–2.0)
Bicarbonate: 22.3 mmol/L (ref 20.0–28.0)
Drawn by: 560031
FIO2: 100
O2 Content: 40 L/min
O2 Saturation: 96.9 %
Patient temperature: 98.6
pCO2 arterial: 30.8 mmHg — ABNORMAL LOW (ref 32.0–48.0)
pH, Arterial: 7.472 — ABNORMAL HIGH (ref 7.350–7.450)
pO2, Arterial: 92.3 mmHg (ref 83.0–108.0)

## 2020-08-06 LAB — PROCALCITONIN: Procalcitonin: <0.1 ng/mL

## 2020-08-06 LAB — GLUCOSE, CAPILLARY
Glucose-Capillary: 103 mg/dL — ABNORMAL HIGH (ref 70–99)
Glucose-Capillary: 76 mg/dL (ref 70–99)
Glucose-Capillary: 84 mg/dL (ref 70–99)
Glucose-Capillary: 80 mg/dL (ref 70–99)
Glucose-Capillary: 107 mg/dL — ABNORMAL HIGH (ref 70–99)

## 2020-08-06 LAB — MRSA PCR SCREENING: MRSA by PCR: POSITIVE — AB

## 2020-08-06 LAB — VANCOMYCIN, PEAK: Vancomycin Pk: 34 ug/mL (ref 30–40)

## 2020-08-06 MED ORDER — CHLORHEXIDINE GLUCONATE CLOTH 2 % EX PADS: 6.0000 | MEDICATED_PAD | Freq: Every day | CUTANEOUS | Status: DC

## 2020-08-06 MED ORDER — TOCILIZUMAB 400 MG/20ML IV SOLN: 8.0000 mg/kg | Freq: Once | INTRAVENOUS | Status: DC

## 2020-08-06 MED ORDER — CHLORHEXIDINE GLUCONATE CLOTH 2 % EX PADS
6.0000 | MEDICATED_PAD | Freq: Every day | CUTANEOUS | Status: DC
  Administered 2020-08-06 – 2020-09-23 (×49): 6 via TOPICAL

## 2020-08-06 MED ORDER — MUPIROCIN 2 % EX OINT
1.0000 "application " | TOPICAL_OINTMENT | Freq: Two times a day (BID) | CUTANEOUS | Status: AC
  Administered 2020-08-06 – 2020-08-10 (×9): 1 via NASAL
  Filled 2020-08-06 (×2): qty 22

## 2020-08-06 MED ORDER — GUAIFENESIN-DM 100-10 MG/5ML PO SYRP
10.0000 mL | ORAL_SOLUTION | Freq: Three times a day (TID) | ORAL | Status: DC
  Administered 2020-08-07 – 2020-09-23 (×127): 10 mL via ORAL
  Filled 2020-08-06 (×129): qty 10

## 2020-08-06 MED ORDER — SODIUM CHLORIDE 0.9 % IV SOLN
2.0000 g | Freq: Three times a day (TID) | INTRAVENOUS | Status: DC
  Filled 2020-08-06: qty 2

## 2020-08-06 MED ORDER — SODIUM CHLORIDE 0.9 % IV SOLN
2.0000 g | Freq: Three times a day (TID) | INTRAVENOUS | Status: DC
  Administered 2020-08-06 – 2020-08-07 (×2): 2 g via INTRAVENOUS
  Filled 2020-08-06 (×2): qty 2

## 2020-08-06 MED ORDER — HYDRALAZINE HCL 20 MG/ML IJ SOLN
10.0000 mg | INTRAMUSCULAR | Status: DC | PRN
  Administered 2020-08-06 – 2020-08-19 (×5): 10 mg via INTRAVENOUS
  Filled 2020-08-06 (×7): qty 1

## 2020-08-06 MED ORDER — BENZONATATE 100 MG PO CAPS
200.0000 mg | ORAL_CAPSULE | Freq: Three times a day (TID) | ORAL | Status: DC
  Administered 2020-08-06 – 2020-09-23 (×142): 200 mg via ORAL
  Filled 2020-08-06 (×142): qty 2

## 2020-08-06 MED ORDER — METHYLPREDNISOLONE SODIUM SUCC 125 MG IJ SOLR
60.0000 mg | Freq: Four times a day (QID) | INTRAMUSCULAR | Status: DC
  Administered 2020-08-06 – 2020-08-09 (×12): 60 mg via INTRAVENOUS
  Filled 2020-08-06 (×11): qty 2

## 2020-08-06 MED ORDER — METOPROLOL SUCCINATE ER 25 MG PO TB24
25.0000 mg | ORAL_TABLET | Freq: Every day | ORAL | Status: DC
Start: 2020-08-07 — End: 2020-09-23
  Administered 2020-08-07 – 2020-09-23 (×47): 25 mg via ORAL
  Filled 2020-08-06 (×48): qty 1
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
## 2020-08-06 NOTE — Plan of Care (Signed)
  Problem: Education: Goal: Knowledge of risk factors and measures for prevention of condition will improve Outcome: Progressing   Problem: Coping: Goal: Psychosocial and spiritual needs will be supported Outcome: Progressing   Problem: Respiratory: Goal: Will maintain a patent airway Outcome: Progressing Goal: Complications related to the disease process, condition or treatment will be avoided or minimized Outcome: Progressing   Problem: Pain Managment: Goal: General experience of comfort will improve Outcome: Progressing   Problem: Safety: Goal: Ability to remain free from injury will improve Outcome: Progressing

## 2020-08-06 NOTE — Consult Note (Signed)
NAME:  [name], MRN:  [id number], DOB:  Jul 06, 1950, LOS: 6 ADMISSION DATE:  07/31/2020, CONSULTATION DATE:  08/06/20 REFERRING MD:  Gilford Rile CHIEF COMPLAINT:  Hypoxia, covid  Brief History:  Covid, increasing oxygen requirements  History of Present Illness:  [name] is a 70 y/o woman with a history of CAD, asthma, and obesity who presents with covid pneumonia.  She was admitted on 1/2 after 2 days of fever, cough, dyspnea.  Since admission she has had increasing oxygen requirements, this morning going from salter to heated high flow and nonrebreather.  Pulmonology was consulted for evaluation. She did not receive her covid vaccine due to being allergic, although when asked about the allergy she is unsure which vaccines in the past or what component she was allergic to.  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 rate (!) 21, height 5\' 5"  (1.651 m), weight 99.8 kg, SpO2 92 %.    FiO2 (%):  [100 %] 100 %   Intake/Output Summary (Last 24 hours) at 08/06/2020 [id number] Last data filed at 08/05/2020 1628 Gross per 24 hour  Intake 750 ml  Output --  Net 750 ml   Filed Weights   07/31/20 1305  Weight: 99.8 kg    Examination: General: ill appearing woman sitting up in bed in NAD HENT: [place]/AT, eyes anicteric Lungs: rhales bilaterally, controlled respirations with no accessory muscle use. Speaking in near-complete sentences/ Cardiovascular:  RRR, no murmur Abdomen: obese, soft, NT Extremities: no c/c/e Neuro: awake, alert, answering questions appropriately, moving all extremities spontaneously Derm: Warm, dry, no rashes  CXR> mildly increase LLL lateral patchy opacity, diffuse bilateral opacities  Resolved Hospital Problem list     Assessment & Plan:   Acute respiratory failure with hypoxia due to covid 19 viral pneumonia -con't supplemental O2 as required to maintain SpO2 >85% with appropriate work of breathing -with rapidly increasing oxygen requriements, would transfer to SDU for closer monitoring.  -Not a good candidate for bipap if progressively hypoxic- with the degree of airspace disease she has, I anticipate she would require full PPV support and would not be able to take breakst from bipap, making it a less ideal therapy. -con't steroids,  -completed tocilizumab on 1/29, remdesivir x 5 days. No role for additional doses with downtrending biomarkers. -adding cefepime to vancomycin to cover for HAP given decompensation; checking PCT levels -focus on OOB mobility; PT & OT consults reordered -IS, fluttter, pulmonary hygiene  Distal LE DVT on left -in the setting of covid with hypercoagulability and relative immobility, agree with full AC to prevent progression. Needs 3 months treatment for provoked clot.  Staph hominis bacteremia at admission; resolved on follow up cultures -appreciate ID's assistance; con't vancomycin  GoC -discussed her goals should her disease progress. She does not want to die and although she would prefer to not be intubated, she did not want to do a comfort-based approach if she  deteriorates. She has designated her step-daughter Marcie Bal to be her surrogate decision maker over her other children.  Best practice (evaluated daily)  Per primary Disposition: SDU  Goals of Care:  Last date of multidisciplinary goals of care discussion: 2/3 Family and staff present: patient, myself Summary of  discussion: full scope of care Follow up goals of care discussion due: 2/10 Code Status: full  Labs   CBC: Recent Labs  Lab 08/01/20 0503 08/02/20 0455 08/03/20 0420 08/04/20 0432 08/05/20 0500 08/06/20 0407  WBC 4.7 9.8 11.7* 12.4* 11.2* 8.3  NEUTROABS 3.7 7.8* 9.4* 10.4* 9.2*  --   HGB 12.6 12.6 12.8 13.1 13.5 13.1  HCT 38.3 37.5 38.8 40.0 40.6 39.5  MCV 84.5 83.3 84.2 83.9 84.4 83.2  PLT 205 213 233 261 225 846    Basic Metabolic Panel: Recent Labs  Lab 08/02/20 0455 08/03/20 0420 08/04/20 0432 08/05/20 0500 08/06/20 0407  NA 141 140 143 142 139  K 3.2* 3.4* 3.8 3.3* 3.4*  CL 110 108 112* 110 109  CO2 20* 21* 21* 23 24  GLUCOSE 130* 108* 118* 84 75  BUN 25* 31* 29* 33* 22  CREATININE 0.86 0.77 0.79 0.87 0.77  CALCIUM 8.5* 8.6* 8.8* 8.6* 8.4*  MG  --  2.0  --   --   --   PHOS  --  2.6  --   --   --    GFR: Estimated Creatinine Clearance: 77.6 mL/min (by C-G formula based on SCr of 0.77 mg/dL). Recent Labs  Lab 07/31/20 1326 07/31/20 1505 07/31/20 2004 07/31/20 2025 08/01/20 0503 08/03/20 0420 08/04/20 0432 08/05/20 0500 08/06/20 0407  PROCALCITON 0.10  --   --   --   --   --   --   --   --   WBC 7.1  --    < >  --    < > 11.7* 12.4* 11.2* 8.3  LATICACIDVEN 3.0* 3.0*  --  2.2*  --   --   --   --   --    < > = values in this interval not displayed.    Liver Function Tests: Recent Labs  Lab 08/01/20 0503 08/02/20 0455 08/03/20 0420 08/04/20 0432 08/05/20 0500  AST 47* 38 36 43* 32  ALT 31 30 28  34 31  ALKPHOS 84 85 85 84 76  BILITOT 0.8 0.6 1.0 0.7 0.6  PROT 6.4* 6.1* 6.0* 6.0* 5.6*  ALBUMIN 2.9* 2.8* 2.7* 2.9* 2.8*   No results for input(s): LIPASE, AMYLASE in the last 168 hours. No results for input(s): AMMONIA in the last 168 hours.  ABG    Component Value Date/Time   PHART 7.409 04/30/2018 1358   PCO2ART 35.4 04/30/2018 1358   PO2ART 61.0 (L) 04/30/2018 1358   HCO3 22.4 04/30/2018 1358   TCO2 23 04/30/2018 1358   ACIDBASEDEF 2.0  04/30/2018 1358   O2SAT 91.0 04/30/2018 1358     Coagulation Profile: Recent Labs  Lab 07/31/20 2004  INR 1.1    Cardiac Enzymes: No results for input(s): CKTOTAL, CKMB, CKMBINDEX, TROPONINI in the last 168 hours.  HbA1C: Hgb A1c MFr Bld  Date/Time Value Ref Range Status  08/01/2020 05:03 AM 6.0 (H) 4.8 - 5.6 % Final    Comment:    (NOTE) Pre diabetes:          5.7%-6.4%  Diabetes:              >6.4%  Glycemic control for   <7.0%  adults with diabetes   09/19/2014 06:47 PM 5.8 (H) 4.8 - 5.6 % Final    Comment:    (NOTE)         Pre-diabetes: 5.7 - 6.4         Diabetes: >6.4         Glycemic control for adults with diabetes: <7.0     CBG: Recent Labs  Lab 08/05/20 0743 08/05/20 1119 08/05/20 1649 08/05/20 1740 08/05/20 2104  GLUCAP 81 90 68* 109* 103*    Review of Systems:   Review of Systems  Constitutional: Positive for fever. Negative for chills and weight loss.  HENT: Negative.   Respiratory: Positive for cough and shortness of breath.   Cardiovascular: Negative for chest pain and palpitations.  Gastrointestinal: Negative.   Neurological: Negative for weakness.     Past Medical History:  She,  has a past medical history of Adenomatous colon polyp (2006), Arrhythmia, Arthritis, Asthma, Chronic headaches, Complication of anesthesia, Diabetes mellitus type II, controlled ([place]), Gastric polyp, GERD (gastroesophageal reflux disease), Headache, Hyperlipidemia, Hypertension, IBS (irritable bowel syndrome), Kidney stones, Lymphocytic colitis, Myocardial infarct ([place]), Neuropathy, peripheral, PONV (postoperative nausea and vomiting), Sleep apnea, Ulcer, and Urinary tract infection.   Surgical History:   Past Surgical History:  Procedure Laterality Date  . ABDOMINAL HYSTERECTOMY    . ABDOMINAL HYSTERECTOMY    . BACK SURGERY    . BLADDER REPAIR     x2  . CARDIAC CATHETERIZATION  04/2008  . CHOLECYSTECTOMY  2000  . LEFT HEART CATHETERIZATION WITH CORONARY  ANGIOGRAM N/A 08/08/2012   Procedure: LEFT HEART CATHETERIZATION WITH CORONARY ANGIOGRAM;  Surgeon: Lorretta Harp, MD;  [facility] CATH LAB;  Service: Cardiovascular;  Laterality: N/A;  . RIGHT/LEFT HEART CATH AND CORONARY ANGIOGRAPHY N/A 04/30/2018   Procedure: RIGHT/LEFT HEART CATH AND CORONARY ANGIOGRAPHY;  Surgeon: Lorretta Harp, MD;  Location: [facility] CV LAB;  Service: Cardiovascular;  Laterality: N/A;  Stephanie Coup ABLATION N/A 07/19/2018   Procedure: Stephanie Coup ABLATION;  Surgeon: Thompson Grayer, MD;  Location: [facility] CV LAB;  Service: Cardiovascular;  Laterality: N/A;     Social History:   reports that she has never smoked. She has never used smokeless tobacco. She reports that she does not drink alcohol and does not use drugs.   Family History:  Her family history includes Colon cancer in her paternal uncle; Hypertension in her mother. There is no history of Esophageal cancer, Stomach cancer, or Rectal cancer.   Allergies Allergies  Allergen Reactions  . Penicillins Anaphylaxis and Swelling    Has patient had a PCN reaction causing immediate rash, facial/tongue/throat swelling, SOB or lightheadedness with hypotension: Yes Has patient had a PCN reaction causing severe rash involving mucus membranes or skin necrosis: No Has patient had a PCN reaction that required hospitalization: Yes Has patient had a PCN reaction occurring within the last 10 years: No    . Shellfish-Derived Products Anaphylaxis and Swelling  . Sulfa Antibiotics Anaphylaxis and Swelling  . Codeine Nausea Only  . Morphine Nausea And Vomiting  . Morphine And Related Nausea And Vomiting     Home Medications  Prior to Admission medications   Medication Sig Start Date End Date Taking? Authorizing Provider  ALPRAZolam Duanne Moron) 1 MG tablet Take 0.5 mg by mouth daily as needed for anxiety.    Yes [provider]  aspirin 81 MG tablet Take 81 mg by mouth daily.   Yes [provider]   atorvastatin (LIPITOR) 40  MG tablet Take 1 tablet (40 mg total) by mouth daily at 6 PM. 05/01/18  Yes Almyra Deforest, PA  busPIRone (BUSPAR) 10 MG tablet Take 10 mg by mouth 2 (two) times daily.   Yes [provider]  cholecalciferol (VITAMIN D) 1000 UNITS tablet Take 2,000 Units by mouth daily.    Yes [provider]  gabapentin (NEURONTIN) 100 MG capsule Take 200 mg by mouth at bedtime.   Yes [provider]  HYDROcodone-acetaminophen (NORCO) 10-325 MG per tablet Take 0.5-1 tablets by mouth every 6 (six) hours as needed for moderate pain or severe pain.    Yes [provider]  meclizine (ANTIVERT) 25 MG tablet Take 25 mg by mouth 3 (three) times daily as needed for dizziness.   Yes [provider]  metoprolol succinate (TOPROL-XL) 25 MG 24 hr tablet Take 0.5 tablets (12.5 mg total) by mouth daily. 11/16/18  Yes Allred, Jeneen Rinks, MD  nitroGLYCERIN (NITROSTAT) 0.4 MG SL tablet Place 1 tablet (0.4 mg total) under the tongue every 5 (five) minutes as needed for chest pain. Patient taking differently: Place 0.4 mg under the tongue every 5 (five) minutes as needed for chest pain. 06/16/20  Yes Lorretta Harp, MD  omeprazole (PRILOSEC) 20 MG capsule TAKE ONE CAPSULE BY MOUTH TWICE A DAY Patient taking differently: Take 20 mg by mouth 2 (two) times daily before a meal. 05/07/12  Yes Lafayette Dragon, MD  sacubitril-valsartan (ENTRESTO) 49-51 MG Take 1 tablet by mouth 2 (two) times daily.   Yes [provider]  SYMBICORT 160-4.5 MCG/ACT inhaler Inhale 2 puffs into the lungs 2 (two) times daily. 07/28/20  Yes [provider]  tiZANidine (ZANAFLEX) 4 MG capsule Take 4-8 mg by mouth at bedtime. For muscle pain   Yes [provider]  topiramate (TOPAMAX) 50 MG tablet Take 50 mg by mouth at bedtime. 05/11/20  Yes [provider]  Turmeric 500 MG CAPS Take 500 mg by mouth daily.   Yes [provider]  Ubrogepant (UBRELVY) 100 MG TABS  Take 100 mg by mouth daily as needed (migraine).   Yes [provider]  vitamin E 180 MG (400 UNITS) capsule Take 400 Units by mouth daily.   Yes [provider]  sacubitril-valsartan (ENTRESTO) 24-26 MG Take 1 tablet by mouth 2 (two) times daily. Patient not taking: Reported on 07/31/2020 05/29/18   Lorretta Harp, MD       Julian Hy, DO 08/06/20 4:06 PM [facility]  From [place] if no response to pager, please call [phone]. After hours, 7PM- 7AM, please call Elink  [phone].

## 2020-08-07 DIAGNOSIS — J9601 Acute respiratory failure with hypoxia: Secondary | ICD-10-CM | POA: Diagnosis not present

## 2020-08-07 DIAGNOSIS — U071 COVID-19: Secondary | ICD-10-CM | POA: Diagnosis not present

## 2020-08-07 LAB — BASIC METABOLIC PANEL
Anion gap: 13 (ref 5–15)
BUN: 24 mg/dL — ABNORMAL HIGH (ref 8–23)
CO2: 20 mmol/L — ABNORMAL LOW (ref 22–32)
Calcium: 8.8 mg/dL — ABNORMAL LOW (ref 8.9–10.3)
Chloride: 107 mmol/L (ref 98–111)
Creatinine, Ser: 0.67 mg/dL (ref 0.44–1.00)
GFR, Estimated: >60 mL/min (ref >60)
Glucose, Bld: 116 mg/dL — ABNORMAL HIGH (ref 70–99)
Potassium: 3.6 mmol/L (ref 3.5–5.1)
Sodium: 140 mmol/L (ref 135–145)

## 2020-08-07 LAB — CBC
HCT: 43.1 % (ref 36.0–46.0)
Hemoglobin: 14.4 g/dL (ref 12.0–15.0)
MCH: 27.9 pg (ref 26.0–34.0)
MCHC: 33.4 g/dL (ref 30.0–36.0)
MCV: 83.4 fL (ref 80.0–100.0)
Platelets: 231 10*3/uL (ref 150–400)
RBC: 5.17 MIL/uL — ABNORMAL HIGH (ref 3.87–5.11)
RDW: 15 % (ref 11.5–15.5)
WBC: 11.3 10*3/uL — ABNORMAL HIGH (ref 4.0–10.5)
nRBC: 0.2 % (ref 0.0–0.2)

## 2020-08-07 LAB — VANCOMYCIN, TROUGH: Vancomycin Tr: 8 ug/mL — ABNORMAL LOW (ref 15–20)

## 2020-08-07 LAB — GLUCOSE, CAPILLARY
Glucose-Capillary: 114 mg/dL — ABNORMAL HIGH (ref 70–99)
Glucose-Capillary: 116 mg/dL — ABNORMAL HIGH (ref 70–99)
Glucose-Capillary: 121 mg/dL — ABNORMAL HIGH (ref 70–99)
Glucose-Capillary: 120 mg/dL — ABNORMAL HIGH (ref 70–99)

## 2020-08-07 LAB — PROCALCITONIN: Procalcitonin: <0.1 ng/mL

## 2020-08-07 LAB — LACTIC ACID, PLASMA: Lactic Acid, Venous: 1.7 mmol/L (ref 0.5–1.9)

## 2020-08-07 MED ORDER — TOCILIZUMAB 400 MG/20ML IV SOLN
800.0000 mg | Freq: Once | INTRAVENOUS | Status: DC
  Filled 2020-08-07: qty 40

## 2020-08-07 MED ORDER — LACTINEX PO CHEW
1.0000 | CHEWABLE_TABLET | Freq: Three times a day (TID) | ORAL | Status: DC
  Administered 2020-08-07 – 2020-09-23 (×131): 1 via ORAL
  Filled 2020-08-07 (×147): qty 1

## 2020-08-07 MED ORDER — FUROSEMIDE 10 MG/ML IJ SOLN
40.0000 mg | Freq: Two times a day (BID) | INTRAMUSCULAR | Status: DC
  Administered 2020-08-07 – 2020-08-09 (×4): 40 mg via INTRAVENOUS
  Filled 2020-08-07 (×4): qty 4

## 2020-08-07 MED ORDER — BACID PO TABS
2.0000 | ORAL_TABLET | Freq: Three times a day (TID) | ORAL | Status: DC
  Filled 2020-08-07: qty 2

## 2020-08-07 NOTE — Plan of Care (Signed)

## 2020-08-07 NOTE — Progress Notes (Signed)
OT Cancellation Note  Patient Details Name: [name] MRN: [id number] DOB: 01-26-1951   Cancelled Treatment:    Reason Eval/Treat Not Completed: Patient at procedure or test/ unavailable. Patient just placed on Bipap. Will f/u as able.  Shanecia Hoganson L Khamari Yousuf 08/07/2020, 3:36 PM

## 2020-08-07 NOTE — Progress Notes (Signed)
RT NOTE:  Pt placed on BiPAP per MD order. Pt tolerating well at this time. HHFNC/NRB on standy by when ready to come off BiPAP machine. RT will continue to monitor.
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
## 2020-08-07 NOTE — Progress Notes (Signed)
PCCM:  Worsening hypoxemia COVID19  Now partial code, DNI No intubation.  Continue O2 support   PCCM team will be available if needed.   Garner Nash, DO [facility] 08/07/2020 9:23 AM

## 2020-08-07 NOTE — Progress Notes (Addendum)
Pharmacy Antibiotic Note  [name] is a 70 y.o. female admitted on 07/31/2020 with acute respiratory failure from COVID PNA, and has recently had worsening hypoxia requiring admission to the ICU.   She has a history this admission of suspected Coag neg staph bacteremia, however she has received 7 days of IV vancomycin for this and there is a low suspicion it is true since her repeat blood cultures on admission before antibiotics were negative. She was started on cefepime for the worsening respiratory status but this could be all related to COVID. PCT is negative and there is low suspicion for bacterial sepsis.   She received tocilizumab 800mg  on 1/29 and remdesivir from 1/28-2/1. No data to support another round of remdesivir, tocilizumab or baricitinib, would not favor doing these for risk of superinfection   Plan: Stop vancomycin Stop cefepime   Height: 5\' 5"  (165.1 cm) Weight: 99.8 kg (220 lb) IBW/kg (Calculated) : 57  Temp (24hrs), Avg:98.2 F (36.8 C), Min:97.6 F (36.4 C), Max:98.7 F (37.1 C)  Recent Labs  Lab 07/31/20 1505 07/31/20 2004 07/31/20 2025 08/01/20 0503 08/03/20 0420 08/04/20 0432 08/05/20 0500 08/06/20 0407 08/06/20 1748 08/07/20 0255 08/07/20 1341  WBC  --    < >  --    < > 11.7* 12.4* 11.2* 8.3  --  11.3*  --   CREATININE  --    < >  --    < > 0.77 0.79 0.87 0.77  --  0.67  --   LATICACIDVEN 3.0*  --  2.2*  --   --   --   --   --   --   --  1.7  VANCOTROUGH  --   --   --   --   --   --   --   --   --   --  8*  VANCOPEAK  --   --   --   --   --   --   --   --  34  --   --    < > = values in this interval not displayed.    Estimated Creatinine Clearance: 77.6 mL/min (by C-G formula based on SCr of 0.67 mg/dL).    Allergies  Allergen Reactions  . Penicillins Anaphylaxis and Swelling    Has patient had a PCN reaction causing immediate rash, facial/tongue/throat swelling, SOB or lightheadedness with hypotension: Yes Has patient had a PCN reaction  causing severe rash involving mucus membranes or skin necrosis: No Has patient had a PCN reaction that required hospitalization: Yes Has patient had a PCN reaction occurring within the last 10 years: No    . Shellfish-Derived Products Anaphylaxis and Swelling  . Sulfa Antibiotics Anaphylaxis and Swelling  . Codeine Nausea Only  . Morphine Nausea And Vomiting  . Morphine And Related Nausea And Vomiting    Antimicrobials this admission: 1/29 vancomycin >> 2/4 2/3 cefepime >>2/4  Dose adjustments this admission:  Microbiology results: 1/28 BCx: GPC in 4/8 bottles, Staph species per BCID 1/28 COVID: +  Thank you for allowing pharmacy to be a part of this patient's care.  Phillis Haggis 08/07/2020 2:34 PM

## 2020-08-07 NOTE — Progress Notes (Signed)
Draper for Lovenox Indication: VTE treatment  Allergies  Allergen Reactions  . Penicillins Anaphylaxis and Swelling    Has patient had a PCN reaction causing immediate rash, facial/tongue/throat swelling, SOB or lightheadedness with hypotension: Yes Has patient had a PCN reaction causing severe rash involving mucus membranes or skin necrosis: No Has patient had a PCN reaction that required hospitalization: Yes Has patient had a PCN reaction occurring within the last 10 years: No    . Shellfish-Derived Products Anaphylaxis and Swelling  . Sulfa Antibiotics Anaphylaxis and Swelling  . Codeine Nausea Only  . Morphine Nausea And Vomiting  . Morphine And Related Nausea And Vomiting    Patient Measurements: Height: 5\' 5"  (165.1 cm) Weight: 99.8 kg (220 lb) IBW/kg (Calculated) : 57  Vital Signs: Temp: 97.6 F (36.4 C) (02/04 0800) Temp Source: Oral (02/04 0800) BP: 133/91 (02/04 1003) Pulse Rate: 103 (02/04 1000)  Labs: Recent Labs    08/05/20 0500 08/06/20 0407 08/07/20 0255  HGB 13.5 13.1 14.4  HCT 40.6 39.5 43.1  PLT 225 211 231  CREATININE 0.87 0.77 0.67    Estimated Creatinine Clearance: 77.6 mL/min (by C-G formula based on SCr of 0.67 mg/dL).   Medications:  No PTA anticoagulation listed.  Assessment: Pharmacy consulted to dose enoxaparin (Lovenox) for VTE treatment in this COVID positive patient with acute DVT.   Today, 08/07/20  CBC: Hgb & Plt both WNL & stable  SCr 0.67, CrCl ~78 mL/min. Stable  No bleeding reported   Plan:   Continue enoxaparin 100 mg (1 mg/kg) subQ q12h  Follow CBC, renal function  Follow along for eventual transition to PO anticoagulation  Eudelia Bunch, Pharm.D 08/07/2020 11:27 AM

## 2020-08-08 DIAGNOSIS — J9601 Acute respiratory failure with hypoxia: Secondary | ICD-10-CM | POA: Diagnosis not present

## 2020-08-08 DIAGNOSIS — U071 COVID-19: Secondary | ICD-10-CM | POA: Diagnosis not present

## 2020-08-08 LAB — GLUCOSE, CAPILLARY
Glucose-Capillary: 112 mg/dL — ABNORMAL HIGH (ref 70–99)
Glucose-Capillary: 153 mg/dL — ABNORMAL HIGH (ref 70–99)
Glucose-Capillary: 197 mg/dL — ABNORMAL HIGH (ref 70–99)
Glucose-Capillary: 153 mg/dL — ABNORMAL HIGH (ref 70–99)

## 2020-08-08 LAB — CBC
HCT: 42 % (ref 36.0–46.0)
Hemoglobin: 14.2 g/dL (ref 12.0–15.0)
MCH: 28.3 pg (ref 26.0–34.0)
MCHC: 33.8 g/dL (ref 30.0–36.0)
MCV: 83.7 fL (ref 80.0–100.0)
Platelets: 231 10*3/uL (ref 150–400)
RBC: 5.02 MIL/uL (ref 3.87–5.11)
RDW: 15.3 % (ref 11.5–15.5)
WBC: 14.6 10*3/uL — ABNORMAL HIGH (ref 4.0–10.5)
nRBC: 0 % (ref 0.0–0.2)

## 2020-08-08 LAB — COMPREHENSIVE METABOLIC PANEL
ALT: 31 U/L (ref 0–44)
AST: 27 U/L (ref 15–41)
Albumin: 3 g/dL — ABNORMAL LOW (ref 3.5–5.0)
Alkaline Phosphatase: 63 U/L (ref 38–126)
Anion gap: 7 (ref 5–15)
BUN: 33 mg/dL — ABNORMAL HIGH (ref 8–23)
CO2: 21 mmol/L — ABNORMAL LOW (ref 22–32)
Calcium: 8.5 mg/dL — ABNORMAL LOW (ref 8.9–10.3)
Chloride: 108 mmol/L (ref 98–111)
Creatinine, Ser: 0.75 mg/dL (ref 0.44–1.00)
GFR, Estimated: >60 mL/min (ref >60)
Glucose, Bld: 126 mg/dL — ABNORMAL HIGH (ref 70–99)
Potassium: 3.2 mmol/L — ABNORMAL LOW (ref 3.5–5.1)
Sodium: 136 mmol/L (ref 135–145)
Total Bilirubin: 1.3 mg/dL — ABNORMAL HIGH (ref 0.3–1.2)
Total Protein: 5.8 g/dL — ABNORMAL LOW (ref 6.5–8.1)

## 2020-08-08 LAB — C-REACTIVE PROTEIN: CRP: <0.5 mg/dL (ref <1.0)

## 2020-08-08 LAB — PROCALCITONIN: Procalcitonin: <0.1 ng/mL

## 2020-08-08 MED ORDER — POTASSIUM CHLORIDE CRYS ER 20 MEQ PO TBCR
30.0000 meq | EXTENDED_RELEASE_TABLET | ORAL | Status: AC
  Administered 2020-08-08 (×2): 30 meq via ORAL
  Filled 2020-08-08 (×2): qty 1
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
## 2020-08-08 NOTE — Progress Notes (Signed)
PT Cancellation Note  Patient Details Name: [name] MRN: [id number] DOB: October 06, 1950   Cancelled Treatment:     PT attempted but deferred at request of RN "pt maxing out on O2".  Will follow   Keagan Brislin 08/08/2020, 4:29 PM

## 2020-08-08 NOTE — Progress Notes (Signed)
Placed pt on bipap.Pt unable to tolerate it at this time. Feels like she cannot breath with the bipap on. Placed pt back on High flow 100% 50l and PRB.

## 2020-08-09 DIAGNOSIS — U071 COVID-19: Secondary | ICD-10-CM | POA: Diagnosis not present

## 2020-08-09 DIAGNOSIS — J9601 Acute respiratory failure with hypoxia: Secondary | ICD-10-CM | POA: Diagnosis not present

## 2020-08-09 LAB — COMPREHENSIVE METABOLIC PANEL
ALT: 32 U/L (ref 0–44)
AST: 27 U/L (ref 15–41)
Albumin: 3.1 g/dL — ABNORMAL LOW (ref 3.5–5.0)
Alkaline Phosphatase: 67 U/L (ref 38–126)
Anion gap: 14 (ref 5–15)
BUN: 51 mg/dL — ABNORMAL HIGH (ref 8–23)
CO2: 20 mmol/L — ABNORMAL LOW (ref 22–32)
Calcium: 9.3 mg/dL (ref 8.9–10.3)
Chloride: 105 mmol/L (ref 98–111)
Creatinine, Ser: 1.03 mg/dL — ABNORMAL HIGH (ref 0.44–1.00)
GFR, Estimated: 59 mL/min — ABNORMAL LOW (ref >60)
Glucose, Bld: 200 mg/dL — ABNORMAL HIGH (ref 70–99)
Potassium: 3.7 mmol/L (ref 3.5–5.1)
Sodium: 139 mmol/L (ref 135–145)
Total Bilirubin: 1 mg/dL (ref 0.3–1.2)
Total Protein: 5.9 g/dL — ABNORMAL LOW (ref 6.5–8.1)

## 2020-08-09 LAB — CBC
HCT: 44.3 % (ref 36.0–46.0)
Hemoglobin: 14.4 g/dL (ref 12.0–15.0)
MCH: 28 pg (ref 26.0–34.0)
MCHC: 32.5 g/dL (ref 30.0–36.0)
MCV: 86 fL (ref 80.0–100.0)
Platelets: 236 10*3/uL (ref 150–400)
RBC: 5.15 MIL/uL — ABNORMAL HIGH (ref 3.87–5.11)
RDW: 15.4 % (ref 11.5–15.5)
WBC: 15 10*3/uL — ABNORMAL HIGH (ref 4.0–10.5)
nRBC: 0 % (ref 0.0–0.2)

## 2020-08-09 LAB — GLUCOSE, CAPILLARY
Glucose-Capillary: 165 mg/dL — ABNORMAL HIGH (ref 70–99)
Glucose-Capillary: 118 mg/dL — ABNORMAL HIGH (ref 70–99)
Glucose-Capillary: 110 mg/dL — ABNORMAL HIGH (ref 70–99)
Glucose-Capillary: 128 mg/dL — ABNORMAL HIGH (ref 70–99)

## 2020-08-09 LAB — PROCALCITONIN: Procalcitonin: <0.1 ng/mL

## 2020-08-09 LAB — C-REACTIVE PROTEIN: CRP: 0.6 mg/dL (ref <1.0)

## 2020-08-09 MED ORDER — ALPRAZOLAM 0.5 MG PO TABS
0.5000 mg | ORAL_TABLET | Freq: Three times a day (TID) | ORAL | Status: DC | PRN
  Administered 2020-08-09 – 2020-09-22 (×41): 0.5 mg via ORAL
  Filled 2020-08-09 (×43): qty 1

## 2020-08-09 MED ORDER — METHYLPREDNISOLONE SODIUM SUCC 125 MG IJ SOLR: 60.0000 mg | Freq: Three times a day (TID) | INTRAMUSCULAR | Status: DC

## 2020-08-09 MED ORDER — FUROSEMIDE 10 MG/ML IJ SOLN
40.0000 mg | Freq: Every day | INTRAMUSCULAR | Status: DC
  Administered 2020-08-10 – 2020-08-14 (×5): 40 mg via INTRAVENOUS
  Filled 2020-08-09 (×6): qty 4

## 2020-08-09 MED ORDER — METHYLPREDNISOLONE SODIUM SUCC 125 MG IJ SOLR
60.0000 mg | Freq: Two times a day (BID) | INTRAMUSCULAR | Status: DC
  Administered 2020-08-09 – 2020-08-23 (×30): 60 mg via INTRAVENOUS
  Filled 2020-08-09 (×29): qty 2

## 2020-08-09 NOTE — Progress Notes (Signed)
Pt wore her BIPAP for few hours tonight. RN took it off and she said pt does not want to wear it tonight. I explained to pt that she should go back on the BIPAP again pt refused. BIPAP SB

## 2020-08-09 NOTE — Progress Notes (Signed)
Came to the room because pt O2 at 70's. Saw patient's BIPAP tube pop-out from the mask and reattached it. Patient requested to take BIPAP off and switch to HFNC, NRB. Educated the patient that she needed to wear BIPAP but refused to wear it again. Patient wore BIPAP for 5 hours from 9PM to 2AM.
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
## 2020-08-10 DIAGNOSIS — U071 COVID-19: Secondary | ICD-10-CM | POA: Diagnosis not present

## 2020-08-10 DIAGNOSIS — J9601 Acute respiratory failure with hypoxia: Secondary | ICD-10-CM | POA: Diagnosis not present

## 2020-08-10 LAB — COMPREHENSIVE METABOLIC PANEL
ALT: 30 U/L (ref 0–44)
AST: 24 U/L (ref 15–41)
Albumin: 2.9 g/dL — ABNORMAL LOW (ref 3.5–5.0)
Alkaline Phosphatase: 60 U/L (ref 38–126)
Anion gap: 12 (ref 5–15)
BUN: 53 mg/dL — ABNORMAL HIGH (ref 8–23)
CO2: 23 mmol/L (ref 22–32)
Calcium: 9 mg/dL (ref 8.9–10.3)
Chloride: 103 mmol/L (ref 98–111)
Creatinine, Ser: 0.79 mg/dL (ref 0.44–1.00)
GFR, Estimated: >60 mL/min (ref >60)
Glucose, Bld: 136 mg/dL — ABNORMAL HIGH (ref 70–99)
Potassium: 3.8 mmol/L (ref 3.5–5.1)
Sodium: 138 mmol/L (ref 135–145)
Total Bilirubin: 1.3 mg/dL — ABNORMAL HIGH (ref 0.3–1.2)
Total Protein: 5.5 g/dL — ABNORMAL LOW (ref 6.5–8.1)

## 2020-08-10 LAB — GLUCOSE, CAPILLARY
Glucose-Capillary: 128 mg/dL — ABNORMAL HIGH (ref 70–99)
Glucose-Capillary: 146 mg/dL — ABNORMAL HIGH (ref 70–99)
Glucose-Capillary: 108 mg/dL — ABNORMAL HIGH (ref 70–99)
Glucose-Capillary: 96 mg/dL (ref 70–99)

## 2020-08-10 LAB — CBC
HCT: 40.8 % (ref 36.0–46.0)
Hemoglobin: 13.5 g/dL (ref 12.0–15.0)
MCH: 28 pg (ref 26.0–34.0)
MCHC: 33.1 g/dL (ref 30.0–36.0)
MCV: 84.5 fL (ref 80.0–100.0)
Platelets: 206 10*3/uL (ref 150–400)
RBC: 4.83 MIL/uL (ref 3.87–5.11)
RDW: 14.7 % (ref 11.5–15.5)
WBC: 12.6 10*3/uL — ABNORMAL HIGH (ref 4.0–10.5)
nRBC: 0 % (ref 0.0–0.2)

## 2020-08-10 LAB — C-REACTIVE PROTEIN: CRP: 0.5 mg/dL (ref <1.0)

## 2020-08-10 MED ORDER — SALINE SPRAY 0.65 % NA SOLN
1.0000 | NASAL | Status: DC | PRN
  Administered 2020-08-10: 1 via NASAL
  Filled 2020-08-10 (×2): qty 44
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
## 2020-08-10 NOTE — Progress Notes (Signed)
[place] for Lovenox Indication: VTE treatment  Allergies  Allergen Reactions  . Penicillins Anaphylaxis and Swelling    Has patient had a PCN reaction causing immediate rash, facial/tongue/throat swelling, SOB or lightheadedness with hypotension: Yes Has patient had a PCN reaction causing severe rash involving mucus membranes or skin necrosis: No Has patient had a PCN reaction that required hospitalization: Yes Has patient had a PCN reaction occurring within the last 10 years: No    . Shellfish-Derived Products Anaphylaxis and Swelling  . Sulfa Antibiotics Anaphylaxis and Swelling  . Codeine Nausea Only  . Morphine Nausea And Vomiting  . Morphine And Related Nausea And Vomiting    Patient Measurements: Height: 5\' 5"  (165.1 cm) Weight: 99.8 kg (220 lb) IBW/kg (Calculated) : 57  Vital Signs: Temp: 97.9 F (36.6 C) (02/07 0414) Temp Source: Axillary (02/07 0414) BP: 130/73 (02/07 0400) Pulse Rate: 68 (02/07 0400)  Labs: Recent Labs    08/08/20 0248 08/09/20 0246 08/10/20 0235  HGB 14.2 14.4 13.5  HCT 42.0 44.3 40.8  PLT 231 236 206  CREATININE 0.75 1.03* 0.79    Estimated Creatinine Clearance: 77.6 mL/min (by C-G formula based on SCr of 0.79 mg/dL).   Medications:  No PTA anticoagulation listed.  Assessment: Pharmacy consulted to dose enoxaparin (Lovenox) for VTE treatment in this COVID positive patient with acute DVT. CTa negative for PE.  Today, 08/10/20  CBC: Hgb & Plt both WNL & stable  SCr 0.79, CrCl > 30 ml/min.  Stable  No bleeding reported  Diet: heart healthy, no further dysphagia.  Taking PO meds.  Remains on BiPAP / HFNC 100% FiO2 at 50L   Plan:   Continue enoxaparin 100 mg (1 mg/kg) subQ q12h  Pharmacy will sign off Lovenox consult, but follow along peripherally for eventual transition to PO anticoagulation  Gretta Arab PharmD, BCPS Clinical Pharmacist [facility] main pharmacy [phone] 08/10/2020 10:28  AM
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
## 2020-08-10 NOTE — Progress Notes (Signed)
Physical Therapy Treatment Patient Details Name: [name] MRN: [id number] DOB: 08-Jul-1950 Today's Date: 08/10/2020    History of Present Illness 69 year old female who has not been vaccinated against COVID-19 with a past medical history of OSA not on CPAP, NICM, chronic systolic heart failure, high PVC burden s/p radiofrequency catheter ablation with [name] on 07/18/2018, hypertension who presented to the ED with 2 days of fever, cough, dyspnea. Pt admitted for  Acute hypoxic respiratory failure and Severe Sepsis without septic shock secondary to COVID-19    PT Comments    Pt in ICU bed currently on 50L HHFNC + 15L NRB at rest 92%.   General bed mobility comments: use of rail self able to EOB with extended seated rest break after.  Sat EOB x 8 min before attempting OOB to BSC due to dyspnea.  SATS decreased to low low 80's with slow return.  Assisted back to bed supporting B LE due to fatigue. General transfer comment: assisted from bed to 1/4 pivot BSC with use of hands to steady self.  SATS decreased to mid 70's with this activty.  Required > 15 min seated recovery before attempting another transfer back to bed.  Pt remained on 50 L HHFNC and 15L NRB.  NT came in during and assisted ot with a wash up while on BSC.  Then asissted with standing using walker no more than 60 seconds to perform peri care.  Assisted back to bed.  Required another 5 min seated rest break before attempting to lay back down. General Gait Details: not able due to dyspnea and current O2 demand Pt lives in her home with a GrandSon and will need ST Rehab at SNF   Follow Up Recommendations  SNF     Equipment Recommendations  None recommended by PT    Recommendations for Other Services       Precautions / Restrictions Precautions Precautions: Fall Precaution Comments: monitor vitals, currently on 50% HHFNC 90% FIO2 and 15L NRB Restrictions Weight Bearing Restrictions: No Other Position/Activity  Restrictions: pt with left wrist brace, states she is NWB to wrist    Mobility  Bed Mobility Overal bed mobility: Needs Assistance Bed Mobility: Supine to Sit;Sit to Supine     Supine to sit: Supervision;Min guard Sit to supine: Min guard;Min assist   General bed mobility comments: use of rail self able to EOB with extended seated rest break after.  Sat EOB x 8 min before attempting OOB to BSC due to dyspnea.  SATS decreased to low low 80's with slow return.  Assisted back to bed supporting B LE due to fatigue.  Transfers Overall transfer level: Needs assistance Equipment used: None;Rolling walker (2 wheeled) Transfers: Sit to/from [facility] Sit to Stand: Supervision Stand pivot transfers: Min guard       General transfer comment: assisted from bed to 1/4 pivot BSC with use of hands to steady self.  SATS decreased to mid 70's with this activty.  Required > 15 min seated recovery before attempting another transfer back to bed.  Pt remained on 50 L HHFNC and 15L NRB.  NT came in during and assisted ot with a wash up while on BSC.  Then asissted with standing using walker no more than 60 seconds to perform peri care.  Assisted back to bed.  Required another 5 min seated rest break before attempting to lay back down.  Ambulation/Gait             General Gait  Details: not able due to dyspnea and current O2 demand   Stairs             Wheelchair Mobility    Modified Rankin (Stroke Patients Only)       Balance Overall balance assessment: Needs assistance Sitting-balance support: Feet supported Sitting balance-Leahy Scale: Fair     Standing balance support: Single extremity supported Standing balance-Leahy Scale: Poor                              Cognition Arousal/Alertness: Awake/alert Behavior During Therapy: WFL for tasks assessed/performed Overall Cognitive Status: Within Functional Limits for tasks assessed                                  General Comments: AxO x 3 very sweet      Exercises      General Comments        Pertinent Vitals/Pain Pain Assessment: No/denies pain Faces Pain Scale: No hurt    Home Living Family/patient expects to be discharged to:: Private residence Living Arrangements: Other relatives (grandson) Available Help at Discharge: Family;Available PRN/intermittently Type of Home: House Home Access: Stairs to enter Entrance Stairs-Rails: Right Home Layout: One level Home Equipment: Walker - 2 wheels;Wheelchair - manual;Cane - single point;Bedside commode Additional Comments: has DME from her deceased mother    Prior Function Level of Independence: Independent          PT Goals (current goals can now be found in the care plan section) Acute Rehab PT Goals Patient Stated Goal: use commode Progress towards PT goals: Progressing toward goals    Frequency    Min 2X/week      PT Plan Current plan remains appropriate    Co-evaluation              AM-PAC PT "6 Clicks" Mobility   Outcome Measure  Help needed turning from your back to your side while in a flat bed without using bedrails?: A Little Help needed moving from lying on your back to sitting on the side of a flat bed without using bedrails?: A Little Help needed moving to and from a bed to a chair (including a wheelchair)?: A Little Help needed standing up from a chair using your arms (e.g., wheelchair or bedside chair)?: A Little Help needed to walk in hospital room?: Total Help needed climbing 3-5 steps with a railing? : Total 6 Click Score: 14    End of Session Equipment Utilized During Treatment: Gait belt;Oxygen Activity Tolerance: Treatment limited secondary to medical complications (Comment) Patient left: in bed;with call bell/phone within reach Nurse Communication: Mobility status PT Visit Diagnosis: Difficulty in walking, not elsewhere classified (R26.2)     Time:  1432-1500 PT Time Calculation (min) (ACUTE ONLY): 28 min  Charges:  $Therapeutic Activity: 23-37 mins                     Rica Koyanagi  PTA Acute  Rehabilitation Services Pager      [phone] Office      [phone]

## 2020-08-11 ENCOUNTER — Inpatient Hospital Stay (HOSPITAL_COMMUNITY): Payer: Medicare HMO

## 2020-08-11 DIAGNOSIS — I428 Other cardiomyopathies: Secondary | ICD-10-CM | POA: Diagnosis not present

## 2020-08-11 DIAGNOSIS — Z7189 Other specified counseling: Secondary | ICD-10-CM | POA: Diagnosis not present

## 2020-08-11 DIAGNOSIS — A419 Sepsis, unspecified organism: Secondary | ICD-10-CM | POA: Diagnosis not present

## 2020-08-11 DIAGNOSIS — U071 COVID-19: Secondary | ICD-10-CM | POA: Diagnosis not present

## 2020-08-11 DIAGNOSIS — J9601 Acute respiratory failure with hypoxia: Secondary | ICD-10-CM | POA: Diagnosis not present

## 2020-08-11 DIAGNOSIS — Z515 Encounter for palliative care: Secondary | ICD-10-CM

## 2020-08-11 LAB — CBC WITH DIFFERENTIAL/PLATELET
Abs Immature Granulocytes: 0.15 10*3/uL — ABNORMAL HIGH (ref 0.00–0.07)
Basophils Absolute: 0 10*3/uL (ref 0.0–0.1)
Basophils Relative: 0 %
Eosinophils Absolute: 0 10*3/uL (ref 0.0–0.5)
Eosinophils Relative: 0 %
HCT: 40 % (ref 36.0–46.0)
Hemoglobin: 13.3 g/dL (ref 12.0–15.0)
Immature Granulocytes: 1 %
Lymphocytes Relative: 3 %
Lymphs Abs: 0.3 10*3/uL — ABNORMAL LOW (ref 0.7–4.0)
MCH: 28.1 pg (ref 26.0–34.0)
MCHC: 33.3 g/dL (ref 30.0–36.0)
MCV: 84.4 fL (ref 80.0–100.0)
Monocytes Absolute: 0.5 10*3/uL (ref 0.1–1.0)
Monocytes Relative: 5 %
Neutro Abs: 10.8 10*3/uL — ABNORMAL HIGH (ref 1.7–7.7)
Neutrophils Relative %: 91 %
Platelets: 203 10*3/uL (ref 150–400)
RBC: 4.74 MIL/uL (ref 3.87–5.11)
RDW: 14.6 % (ref 11.5–15.5)
WBC: 11.8 10*3/uL — ABNORMAL HIGH (ref 4.0–10.5)
nRBC: 0 % (ref 0.0–0.2)

## 2020-08-11 LAB — BASIC METABOLIC PANEL
Anion gap: 10 (ref 5–15)
BUN: 45 mg/dL — ABNORMAL HIGH (ref 8–23)
CO2: 24 mmol/L (ref 22–32)
Calcium: 9 mg/dL (ref 8.9–10.3)
Chloride: 103 mmol/L (ref 98–111)
Creatinine, Ser: 0.8 mg/dL (ref 0.44–1.00)
GFR, Estimated: >60 mL/min (ref >60)
Glucose, Bld: 109 mg/dL — ABNORMAL HIGH (ref 70–99)
Potassium: 3.9 mmol/L (ref 3.5–5.1)
Sodium: 137 mmol/L (ref 135–145)

## 2020-08-11 LAB — GLUCOSE, CAPILLARY
Glucose-Capillary: 101 mg/dL — ABNORMAL HIGH (ref 70–99)
Glucose-Capillary: 108 mg/dL — ABNORMAL HIGH (ref 70–99)
Glucose-Capillary: 99 mg/dL (ref 70–99)
Glucose-Capillary: 83 mg/dL (ref 70–99)
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
## 2020-08-12 DIAGNOSIS — U071 COVID-19: Secondary | ICD-10-CM | POA: Diagnosis not present

## 2020-08-12 LAB — GLUCOSE, CAPILLARY
Glucose-Capillary: 110 mg/dL — ABNORMAL HIGH (ref 70–99)
Glucose-Capillary: 106 mg/dL — ABNORMAL HIGH (ref 70–99)
Glucose-Capillary: 151 mg/dL — ABNORMAL HIGH (ref 70–99)
Glucose-Capillary: 90 mg/dL (ref 70–99)

## 2020-08-12 LAB — BASIC METABOLIC PANEL
Anion gap: 11 (ref 5–15)
BUN: 39 mg/dL — ABNORMAL HIGH (ref 8–23)
CO2: 25 mmol/L (ref 22–32)
Calcium: 8.9 mg/dL (ref 8.9–10.3)
Chloride: 102 mmol/L (ref 98–111)
Creatinine, Ser: 0.69 mg/dL (ref 0.44–1.00)
GFR, Estimated: >60 mL/min (ref >60)
Glucose, Bld: 101 mg/dL — ABNORMAL HIGH (ref 70–99)
Potassium: 3.7 mmol/L (ref 3.5–5.1)
Sodium: 138 mmol/L (ref 135–145)

## 2020-08-12 LAB — CBC WITH DIFFERENTIAL/PLATELET
Abs Immature Granulocytes: 0.09 10*3/uL — ABNORMAL HIGH (ref 0.00–0.07)
Basophils Absolute: 0 10*3/uL (ref 0.0–0.1)
Basophils Relative: 0 %
Eosinophils Absolute: 0 10*3/uL (ref 0.0–0.5)
Eosinophils Relative: 0 %
HCT: 42 % (ref 36.0–46.0)
Hemoglobin: 13.9 g/dL (ref 12.0–15.0)
Immature Granulocytes: 1 %
Lymphocytes Relative: 3 %
Lymphs Abs: 0.3 10*3/uL — ABNORMAL LOW (ref 0.7–4.0)
MCH: 28.1 pg (ref 26.0–34.0)
MCHC: 33.1 g/dL (ref 30.0–36.0)
MCV: 85 fL (ref 80.0–100.0)
Monocytes Absolute: 0.4 10*3/uL (ref 0.1–1.0)
Monocytes Relative: 4 %
Neutro Abs: 9.3 10*3/uL — ABNORMAL HIGH (ref 1.7–7.7)
Neutrophils Relative %: 92 %
Platelets: 196 10*3/uL (ref 150–400)
RBC: 4.94 MIL/uL (ref 3.87–5.11)
RDW: 14.5 % (ref 11.5–15.5)
WBC: 10 10*3/uL (ref 4.0–10.5)
nRBC: 0 % (ref 0.0–0.2)

## 2020-08-12 MED ORDER — MAGNESIUM HYDROXIDE 400 MG/5ML PO SUSP
30.0000 mL | Freq: Once | ORAL | Status: AC
  Administered 2020-08-12: 30 mL via ORAL
  Filled 2020-08-12: qty 30

## 2020-08-12 MED ORDER — BISACODYL 5 MG PO TBEC
5.0000 mg | DELAYED_RELEASE_TABLET | Freq: Every day | ORAL | Status: DC | PRN
  Administered 2020-08-18 – 2020-09-13 (×4): 5 mg via ORAL
  Filled 2020-08-12 (×5): qty 1

## 2020-08-12 NOTE — Consult Note (Addendum)
Consultation Note Date: 08/12/2020   Patient Name: [name]  DOB: Mar 18, 1951  MRN: [id number]  Age / Sex: 70 y.o., female  PCP: Gwendel Hanson Referring Physician: Terrilee Croak, MD  Reason for Consultation: Establishing goals of care  HPI/Patient Profile: 70 y.o. female  with past medical history of OSA not on CPAP, and ICM, chronic systolic heart failure, hypertension, PVCs admitted on 07/31/2020 with respiratory failure secondary to Covid pneumonia.  She remains on high oxygen requirement including HFNC and nonrebreather.  Palliative consulted for goals of care.  Clinical Assessment and Goals of Care: Palliative care consult received.  Chart reviewed including personal review of pertinent labs and imaging.  I saw and examined [name] today.  She was awake and alert and sitting in bed in no distress.  She was wearing HFNC as well as nonrebreather.  RN reports that she seemed to be a little bit confused earlier today.  This is my first time meeting her, however, she was able to relay history as well as her social situation prior to this admission.  I introduced palliative care as specialized medical care for people living with serious illness. It focuses on providing relief from the symptoms and stress of a serious illness. The goal is to improve quality of life for both the patient and the family.  She reports the most important things to her are her family, including her 2 daughters, and her faith.  She worked multiple jobs in the past but has been on disability for several years.  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  We discussed clinical course as well as wishes moving forward in regard to  her care plan this hospitalization.  Discussed her current Covid infection and high oxygen requirement.  She reports that she feels "a little better"  today and is hoping that she continues to improve over the next few days/weeks.  We discussed possible pathways forward including improvement in her condition, worsening in her condition, or her being "stuck" in her current state with no significant improvement or deterioration.  We discussed difference between a aggressive medical intervention path and a palliative, comfort focused care path.  Values and goals of care important to patient and family were attempted to be elicited.  Questions and concerns addressed.   PMT will continue to support holistically.  SUMMARY OF RECOMMENDATIONS   -Partial code -[name] reports that she feels her breathing is a little bit better today and she is hopeful she is at a point where she, "turned the corner." -She remains invested in plan to continue with current interventions to see how she continues to do.  Overall, her hope is to recover enough to be able to move in with her daughter rather than returning to her previous home. -There are a lot of social factors influencing [name] at this point, including her working to sell her home, have her grandson move out, and move in with her daughter. -Palliative care will  continue to follow and progress conversation based upon her clinical course of the next several days.  Code Status/Advance Care Planning:  Limited code  Psycho-social/Spiritual:   Desire for further Chaplaincy support: Did not address today  Additional Recommendations: Caregiving  Support/Resources  Prognosis:   Guarded  Discharge Planning: To Be Determined      Primary Diagnoses: Present on Admission: . Acute hypoxemic respiratory failure due to COVID-19 ([place]) . Diabetes mellitus type 2 in obese ([place]) . HTN (hypertension)   I have reviewed the medical record, interviewed the patient and family, and examined the patient. The following aspects are pertinent.  Past Medical History:  Diagnosis Date  . Adenomatous colon polyp  2006  . Arrhythmia    Heart  . Arthritis   . Asthma   . Chronic headaches   . Complication of anesthesia   . Diabetes mellitus type II, controlled ([place])    Diet  . Gastric polyp    hyperplastic  . GERD (gastroesophageal reflux disease)   . Headache   . Hyperlipidemia   . Hypertension   . IBS (irritable bowel syndrome)   . Kidney stones   . Lymphocytic colitis   . Myocardial infarct ([place])   . Neuropathy, peripheral   . PONV (postoperative nausea and vomiting)   . Sleep apnea    no CPAP  . Ulcer   . Urinary tract infection    Social History   Socioeconomic History  . Marital status: Widowed    Spouse name: Not on file  . Number of children: 3  . Years of education: Not on file  . Highest education level: Not on file  Occupational History  . Occupation: Disabled  Tobacco Use  . Smoking status: Never Smoker  . Smokeless tobacco: Never Used  Vaping Use  . Vaping Use: Never used  Substance and Sexual Activity  . Alcohol use: No  . Drug use: No  . Sexual activity: Yes    Birth control/protection: Surgical  Other Topics Concern  . Not on file  Social History Narrative  . Not on file   Social Determinants of Health   Financial Resource Strain: Not on file  Food Insecurity: Not on file  Transportation Needs: Not on file  Physical Activity: Not on file  Stress: Not on file  Social Connections: Not on file   Family History  Problem Relation Age of Onset  . Hypertension Mother   . Colon cancer Paternal Uncle   . Esophageal cancer Neg Hx   . Stomach cancer Neg Hx   . Rectal cancer Neg Hx    Scheduled Meds: . atorvastatin  40 mg Oral q1800  . benzonatate  200 mg Oral TID  . busPIRone  10 mg Oral BID  . Chlorhexidine Gluconate Cloth  6 each Topical Daily  . enoxaparin (LOVENOX) injection  100 mg Subcutaneous Q12H  . furosemide  40 mg Intravenous Daily  . gabapentin  200 mg Oral QHS  . guaiFENesin-dextromethorphan  10 mL Oral Q8H  . insulin aspart  0-15  Units Subcutaneous TID WC  . insulin aspart  4 Units Subcutaneous TID WC  . insulin detemir  0.15 Units/kg Subcutaneous BID  . Ipratropium-Albuterol  1 puff Inhalation TID  . lactobacillus acidophilus & bulgar  1 tablet Oral TID WC  . linagliptin  5 mg Oral Daily  . mouth rinse  15 mL Mouth Rinse BID  . methylPREDNISolone (SOLU-MEDROL) injection  60 mg Intravenous Q12H  . metoprolol succinate  25 mg  Oral Daily  . pantoprazole  40 mg Oral Daily  . sodium chloride flush  3 mL Intravenous Q12H  . topiramate  50 mg Oral QHS   Continuous Infusions: PRN Meds:.acetaminophen, ALPRAZolam, hydrALAZINE, HYDROcodone-acetaminophen, phenol, sodium chloride Medications Prior to Admission:  Prior to Admission medications   Medication Sig Start Date End Date Taking? Authorizing Provider  ALPRAZolam Duanne Moron) 1 MG tablet Take 0.5 mg by mouth daily as needed for anxiety.    Yes [provider]  aspirin 81 MG tablet Take 81 mg by mouth daily.   Yes [provider]  atorvastatin (LIPITOR) 40 MG tablet Take 1 tablet (40 mg total) by mouth daily at 6 PM. 05/01/18  Yes Almyra Deforest, PA  busPIRone (BUSPAR) 10 MG tablet Take 10 mg by mouth 2 (two) times daily.   Yes [provider]  cholecalciferol (VITAMIN D) 1000 UNITS tablet Take 2,000 Units by mouth daily.    Yes [provider]  gabapentin (NEURONTIN) 100 MG capsule Take 200 mg by mouth at bedtime.   Yes [provider]  HYDROcodone-acetaminophen (NORCO) 10-325 MG per tablet Take 0.5-1 tablets by mouth every 6 (six) hours as needed for moderate pain or severe pain.    Yes [provider]  meclizine (ANTIVERT) 25 MG tablet Take 25 mg by mouth 3 (three) times daily as needed for dizziness.   Yes [provider]  metoprolol succinate (TOPROL-XL) 25 MG 24 hr tablet Take 0.5 tablets (12.5 mg total) by mouth daily. 11/16/18  Yes Allred, Jeneen Rinks, MD  nitroGLYCERIN (NITROSTAT) 0.4 MG SL tablet Place 1 tablet (0.4  mg total) under the tongue every 5 (five) minutes as needed for chest pain. Patient taking differently: Place 0.4 mg under the tongue every 5 (five) minutes as needed for chest pain. 06/16/20  Yes Lorretta Harp, MD  omeprazole (PRILOSEC) 20 MG capsule TAKE ONE CAPSULE BY MOUTH TWICE A DAY Patient taking differently: Take 20 mg by mouth 2 (two) times daily before a meal. 05/07/12  Yes Lafayette Dragon, MD  sacubitril-valsartan (ENTRESTO) 49-51 MG Take 1 tablet by mouth 2 (two) times daily.   Yes [provider]  SYMBICORT 160-4.5 MCG/ACT inhaler Inhale 2 puffs into the lungs 2 (two) times daily. 07/28/20  Yes [provider]  tiZANidine (ZANAFLEX) 4 MG capsule Take 4-8 mg by mouth at bedtime. For muscle pain   Yes [provider]  topiramate (TOPAMAX) 50 MG tablet Take 50 mg by mouth at bedtime. 05/11/20  Yes [provider]  Turmeric 500 MG CAPS Take 500 mg by mouth daily.   Yes [provider]  Ubrogepant (UBRELVY) 100 MG TABS Take 100 mg by mouth daily as needed (migraine).   Yes [provider]  vitamin E 180 MG (400 UNITS) capsule Take 400 Units by mouth daily.   Yes [provider]  sacubitril-valsartan (ENTRESTO) 24-26 MG Take 1 tablet by mouth 2 (two) times daily. Patient not taking: Reported on 07/31/2020 05/29/18   Lorretta Harp, MD   Allergies  Allergen Reactions  . Penicillins Anaphylaxis and Swelling    Has patient had a PCN reaction causing immediate rash, facial/tongue/throat swelling, SOB or lightheadedness with hypotension: Yes Has patient had a PCN reaction causing severe rash involving mucus membranes or skin necrosis: No Has patient had a PCN reaction that required hospitalization: Yes Has patient had a PCN reaction occurring within the last 10 years: No    . Shellfish-Derived Products Anaphylaxis and Swelling  .  Sulfa Antibiotics Anaphylaxis and Swelling  . Codeine Nausea Only  . Morphine Nausea And  Vomiting  . Morphine And Related Nausea And Vomiting   Review of Systems  Constitutional: Positive for activity change and fatigue.  Respiratory: Positive for cough and shortness of breath.   Neurological: Positive for weakness.  Psychiatric/Behavioral: Positive for sleep disturbance.   Physical Exam General: Alert, awake, in no acute distress.  On high flow nasal cannula with nonrebreather over top HEENT: No bruits, no goiter, no JVD Heart: Regular rate and rhythm. No murmur appreciated. Lungs: Decreased air movement Abdomen: Soft, nontender, nondistended, positive bowel sounds.  Ext: No significant edema Skin: Warm and dry Neuro: Grossly intact, nonfocal.  Vital Signs: BP 136/72   Pulse 71   Temp 98.1 F (36.7 C) (Axillary)   Resp (!) 21   Ht 5\' 5"  (1.651 m)   Wt 99.8 kg   SpO2 (!) 85%   BMI 36.61 kg/m  Pain Scale: 0-10   Pain Score: 0-No pain   SpO2: SpO2: (!) 85 % O2 Device:SpO2: (!) 85 % O2 Flow Rate: .O2 Flow Rate (L/min): 40 L/min  IO: Intake/output summary:   Intake/Output Summary (Last 24 hours) at 08/12/2020 1035 Last data filed at 08/11/2020 1800 Gross per 24 hour  Intake 240 ml  Output 400 ml  Net -160 ml    LBM: Last BM Date: 08/05/20 Baseline Weight: Weight: 99.8 kg Most recent weight: Weight: 99.8 kg     Palliative Assessment/Data:     Time In: 1640 Time Out: 1800 Time Total: 80 Greater than 50%  of this time was spent counseling and coordinating care related to the above assessment and plan.  Signed by: Micheline Rough, MD   Please contact Palliative Medicine Team phone at [phone] for questions and concerns.  For individual provider: See Shea Evans
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
## 2020-08-12 NOTE — Progress Notes (Signed)
Physical Therapy Treatment Patient Details Name: [name] MRN: [id number] DOB: 07/16/1950 Today's Date: 08/12/2020    History of Present Illness Pt is 70 year old female who has not been vaccinated against COVID-19 with a past medical history of OSA not on CPAP, NICM, chronic systolic heart failure, high PVC burden s/p radiofrequency catheter ablation with [name] on 07/18/2018, hypertension who presented to the ED with 2 days of fever, cough, dyspnea. Pt admitted for  Acute hypoxic respiratory failure and Severe Sepsis without septic shock secondary to COVID-19    PT Comments    Pt continues to have low tolerance to activity.  She was on 50L HHF 90% FiO2 and 15 L NRB with sats dropping as low as 78% with slow recovery.  Only able to tolerate transfer to bsc then chair with extended rest breaks and cues for pursed lip breathing.  Continue to advance as resp status allows.     Follow Up Recommendations  SNF     Equipment Recommendations  None recommended by PT    Recommendations for Other Services       Precautions / Restrictions Precautions Precautions: Fall Precaution Comments: monitor vitals, currently on 50L HHFNC 90% FIO2 and 15L NRB Restrictions Other Position/Activity Restrictions: pt with left wrist brace, states she is NWB to wrist    Mobility  Bed Mobility Overal bed mobility: Needs Assistance Bed Mobility: Supine to Sit     Supine to sit: Supervision;HOB elevated     General bed mobility comments: Increased time  Transfers Overall transfer level: Needs assistance Equipment used: None Transfers: Sit to/from [facility] Sit to Stand: Min guard Stand pivot transfers: Min guard       General transfer comment: Sit to stand from bed with pivot to bsc with min guard to steady.  Required 5 mins to recover O2 sats (see general comments).  Then partial stand to clean 3 mins to recover.Then stand with UE support and PT switched BSC and recliner  - returned to sitting in recliner.  Ambulation/Gait             General Gait Details: not able due to dyspnea and current O2 demand   Stairs             Wheelchair Mobility    Modified Rankin (Stroke Patients Only)       Balance Overall balance assessment: Needs assistance Sitting-balance support: Feet supported Sitting balance-Leahy Scale: Fair     Standing balance support: No upper extremity supported Standing balance-Leahy Scale: Fair Standing balance comment: Pt able to stand upright without UE support for 15 seconds; limited by resp status                            Cognition Arousal/Alertness: Awake/alert Behavior During Therapy: WFL for tasks assessed/performed Overall Cognitive Status: Within Functional Limits for tasks assessed                                        Exercises      General Comments General comments (skin integrity, edema, etc.): Pt on 50 L HHF at 90% FiO2 and 15 L NRB.  At arrival, [place] out of nose and sats 84%.  Reapplied [place] and sats up to 90% in 1 minute.  Pt transferred to chair with sats down to 78%, quickly up to 84% but then requiring  5 mins to recover to 90%.  After ADLs sats down to 83% and taking 4 mins to recover to 90%.  After stand and transfers to chair down to 83%, slowly recovery to 84%, RN in and increased to 60L.  Sats then up to 94% in 1 min and able to be decreased back to 50L.      Pertinent Vitals/Pain Pain Assessment: No/denies pain    Home Living                      Prior Function            PT Goals (current goals can now be found in the care plan section) Acute Rehab PT Goals Patient Stated Goal: breath better PT Goal Formulation: With patient Time For Goal Achievement: 08/15/20 Potential to Achieve Goals: Good Progress towards PT goals: Progressing toward goals    Frequency    Min 2X/week      PT Plan Current plan remains appropriate    Co-evaluation               AM-PAC PT "6 Clicks" Mobility   Outcome Measure  Help needed turning from your back to your side while in a flat bed without using bedrails?: A Little Help needed moving from lying on your back to sitting on the side of a flat bed without using bedrails?: A Little Help needed moving to and from a bed to a chair (including a wheelchair)?: A Little Help needed standing up from a chair using your arms (e.g., wheelchair or bedside chair)?: A Little Help needed to walk in hospital room?: A Lot Help needed climbing 3-5 steps with a railing? : Total 6 Click Score: 15    End of Session Equipment Utilized During Treatment: Gait belt;Oxygen Activity Tolerance: Treatment limited secondary to medical complications (Comment) (resp status) Patient left: with call bell/phone within reach;in chair;with nursing/sitter in room Nurse Communication: Mobility status PT Visit Diagnosis: Difficulty in walking, not elsewhere classified (R26.2)     Time: 4235-3614 PT Time Calculation (min) (ACUTE ONLY): 23 min  Charges:  $Therapeutic Activity: 23-37 mins                     Abran Richard, PT Acute Rehab Services Pager [phone] [facility] [place] 08/12/2020, 3:34 PM

## 2020-08-13 DIAGNOSIS — J9601 Acute respiratory failure with hypoxia: Secondary | ICD-10-CM | POA: Diagnosis not present

## 2020-08-13 DIAGNOSIS — U071 COVID-19: Secondary | ICD-10-CM | POA: Diagnosis not present

## 2020-08-13 LAB — GLUCOSE, CAPILLARY
Glucose-Capillary: 115 mg/dL — ABNORMAL HIGH (ref 70–99)
Glucose-Capillary: 110 mg/dL — ABNORMAL HIGH (ref 70–99)
Glucose-Capillary: 199 mg/dL — ABNORMAL HIGH (ref 70–99)
Glucose-Capillary: 109 mg/dL — ABNORMAL HIGH (ref 70–99)

## 2020-08-13 LAB — BASIC METABOLIC PANEL
Anion gap: 9 (ref 5–15)
BUN: 50 mg/dL — ABNORMAL HIGH (ref 8–23)
CO2: 28 mmol/L (ref 22–32)
Calcium: 9 mg/dL (ref 8.9–10.3)
Chloride: 100 mmol/L (ref 98–111)
Creatinine, Ser: 0.83 mg/dL (ref 0.44–1.00)
GFR, Estimated: >60 mL/min (ref >60)
Glucose, Bld: 108 mg/dL — ABNORMAL HIGH (ref 70–99)
Potassium: 3.7 mmol/L (ref 3.5–5.1)
Sodium: 137 mmol/L (ref 135–145)

## 2020-08-13 LAB — CBC WITH DIFFERENTIAL/PLATELET
Abs Immature Granulocytes: 0.09 10*3/uL — ABNORMAL HIGH (ref 0.00–0.07)
Basophils Absolute: 0 10*3/uL (ref 0.0–0.1)
Basophils Relative: 0 %
Eosinophils Absolute: 0 10*3/uL (ref 0.0–0.5)
Eosinophils Relative: 0 %
HCT: 43.6 % (ref 36.0–46.0)
Hemoglobin: 14.8 g/dL (ref 12.0–15.0)
Immature Granulocytes: 1 %
Lymphocytes Relative: 2 %
Lymphs Abs: 0.3 10*3/uL — ABNORMAL LOW (ref 0.7–4.0)
MCH: 28.4 pg (ref 26.0–34.0)
MCHC: 33.9 g/dL (ref 30.0–36.0)
MCV: 83.5 fL (ref 80.0–100.0)
Monocytes Absolute: 0.5 10*3/uL (ref 0.1–1.0)
Monocytes Relative: 4 %
Neutro Abs: 12.5 10*3/uL — ABNORMAL HIGH (ref 1.7–7.7)
Neutrophils Relative %: 93 %
Platelets: 224 10*3/uL (ref 150–400)
RBC: 5.22 MIL/uL — ABNORMAL HIGH (ref 3.87–5.11)
RDW: 14.4 % (ref 11.5–15.5)
WBC: 13.4 10*3/uL — ABNORMAL HIGH (ref 4.0–10.5)
nRBC: 0 % (ref 0.0–0.2)

## 2020-08-13 NOTE — Progress Notes (Signed)
Occupational Therapy Treatment Patient Details Name: [name] MRN: [id number] DOB: 04-01-1951 Today's Date: 08/13/2020    History of present illness Pt is 70 year old female who has not been vaccinated against COVID-19 with a past medical history of OSA not on CPAP, NICM, chronic systolic heart failure, high PVC burden s/p radiofrequency catheter ablation with [name] on 07/18/2018, hypertension who presented to the ED with 2 days of fever, cough, dyspnea. Pt admitted for  Acute hypoxic respiratory failure and Severe Sepsis without septic shock secondary to COVID-19   OT comments  Treatment focused on promoting functional mobility and participation in self care tasks out of bed. Patient's bed wet with urine due to purewick failure - patient supervision to transfer to side of bed and min assist to transfer to [facility]. Patient on 40 L HHFNC and 15 L NRB. Patient's o2 sat dropped to 79% at the lowest - after toileting, partial LB bathing and donning new gown. Patient attempted to assist though limited by having to hold NRB to face during all activity. Patient only mildly dyspneic during activity and pleth signal poor due to patient using hand/flexing fingers throughout treatment. Instructed patient on diaphragmatic breathing and pursed lipped breathing. Patient able to breath through nose but belly not rising.  Patient will benefit from continued skilled OT services to improve activity tolerance and cardiopulmonary status  to reduce oxygenation needs in order for patient to return home at discharge. Expect patient to improve abilities once off of heated high flow and NRB.      Follow Up Recommendations  SNF;Home health OT    Equipment Recommendations  None recommended by OT    Recommendations for Other Services      Precautions / Restrictions Precautions Precautions: Fall Precaution Comments: monitor vitals, currently on 40L HHFNC 90% FIO2 and 15L NRB       Mobility Bed Mobility Overal  bed mobility: Needs Assistance Bed Mobility: Supine to Sit     Supine to sit: Supervision;HOB elevated        Transfers Overall transfer level: Needs assistance Equipment used: None Transfers: Sit to/from [facility] Sit to Stand: Min assist Stand pivot transfers: Min assist       General transfer comment: Min assist to stand and transfer to [facility]. After toileting min assist to stand and transfer to recliner - apple to stand approx 15 seconds for wiping.    Balance Overall balance assessment: Mild deficits observed, not formally tested                                         ADL either performed or assessed with clinical judgement   ADL Overall ADL's : Needs assistance/impaired             Lower Body Bathing: Maximal assistance Lower Body Bathing Details (indicate cue type and reason): max assist for washing up after purewick failure in bed, attempting to assist with one hand but other holding NRB in place.         Toilet Transfer: Minimal Theatre stage manager Details (indicate cue type and reason): transferred to BSc with min assist. Toileting- Clothing Manipulation and Hygiene: Maximal assistance;Sit to/from stand Toileting - Clothing Manipulation Details (indicate cue type and reason): max assist for toileting - managing hospital gown (needing to be changed) and wiping. Patient attempting to help with one hand - the other holding NRB in place.  Vision Patient Visual Report: No change from baseline     Perception     Praxis      Cognition Arousal/Alertness: Awake/alert Behavior During Therapy: WFL for tasks assessed/performed Overall Cognitive Status: Within Functional Limits for tasks assessed                                          Exercises     Shoulder Instructions       General Comments      Pertinent Vitals/ Pain       Pain Assessment: No/denies pain  Home  Living                                          Prior Functioning/Environment              Frequency  Min 2X/week        Progress Toward Goals  OT Goals(current goals can now be found in the care plan section)  Progress towards OT goals: Progressing toward goals  Acute Rehab OT Goals Patient Stated Goal: to be independent OT Goal Formulation: With patient Time For Goal Achievement: 08/24/20 Potential to Achieve Goals: Good  Plan Discharge plan needs to be updated    Co-evaluation                 AM-PAC OT "6 Clicks" Daily Activity     Outcome Measure   Help from another person eating meals?: None Help from another person taking care of personal grooming?: A Little Help from another person toileting, which includes using toliet, bedpan, or urinal?: A Lot Help from another person bathing (including washing, rinsing, drying)?: A Lot Help from another person to put on and taking off regular upper body clothing?: A Little Help from another person to put on and taking off regular lower body clothing?: A Lot 6 Click Score: 16    End of Session Equipment Utilized During Treatment: Oxygen  OT Visit Diagnosis: Other abnormalities of gait and mobility (R26.89);Muscle weakness (generalized) (M62.81)   Activity Tolerance Patient limited by fatigue (decreased cardiopulmonary endurance)   Patient Left     Nurse Communication Mobility status (o2 sats)        Time: 8315-1761 OT Time Calculation (min): 27 min  Charges: OT General Charges $OT Visit: 1 Visit OT Treatments $Self Care/Home Management : 23-37 mins  [name], OTR/L [place]  Office [phone] Pager: [place] 08/13/2020, 4:23 PM
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
## 2020-08-14 DIAGNOSIS — U071 COVID-19: Secondary | ICD-10-CM | POA: Diagnosis not present

## 2020-08-14 DIAGNOSIS — J9601 Acute respiratory failure with hypoxia: Secondary | ICD-10-CM | POA: Diagnosis not present

## 2020-08-14 LAB — GLUCOSE, CAPILLARY
Glucose-Capillary: 121 mg/dL — ABNORMAL HIGH (ref 70–99)
Glucose-Capillary: 107 mg/dL — ABNORMAL HIGH (ref 70–99)
Glucose-Capillary: 176 mg/dL — ABNORMAL HIGH (ref 70–99)
Glucose-Capillary: 120 mg/dL — ABNORMAL HIGH (ref 70–99)
Glucose-Capillary: 115 mg/dL — ABNORMAL HIGH (ref 70–99)

## 2020-08-14 MED ORDER — APIXABAN 5 MG PO TABS
5.0000 mg | ORAL_TABLET | Freq: Two times a day (BID) | ORAL | Status: DC
  Administered 2020-08-14 – 2020-09-23 (×80): 5 mg via ORAL
  Filled 2020-08-14 (×80): qty 1

## 2020-08-14 NOTE — Progress Notes (Signed)
[place] for Apixaban Indication: VTE treatment  Allergies  Allergen Reactions  . Penicillins Anaphylaxis and Swelling    Has patient had a PCN reaction causing immediate rash, facial/tongue/throat swelling, SOB or lightheadedness with hypotension: Yes Has patient had a PCN reaction causing severe rash involving mucus membranes or skin necrosis: No Has patient had a PCN reaction that required hospitalization: Yes Has patient had a PCN reaction occurring within the last 10 years: No    . Shellfish-Derived Products Anaphylaxis and Swelling  . Sulfa Antibiotics Anaphylaxis and Swelling  . Codeine Nausea Only  . Morphine Nausea And Vomiting  . Morphine And Related Nausea And Vomiting    Patient Measurements: Height: 5\' 5"  (165.1 cm) Weight: 99.8 kg (220 lb) IBW/kg (Calculated) : 57  Vital Signs: Temp: 98.4 F (36.9 C) (02/11 1100) Temp Source: Axillary (02/11 1100) BP: 139/103 (02/11 0800) Pulse Rate: 98 (02/11 0911)  Labs: Recent Labs    08/12/20 0258 08/13/20 0257  HGB 13.9 14.8  HCT 42.0 43.6  PLT 196 224  CREATININE 0.69 0.83    Estimated Creatinine Clearance: 74.8 mL/min (by C-G formula based on SCr of 0.83 mg/dL).  Medications: No PTA anticoagulation listed  Assessment: Pharmacy consulted to transition anticoagulation from therapeutic enoxaparin to apixaban for VTE treatment. Patient has been on therapeutic LMWH from 1/29 > 2/11.   1/29: CTa negative for PE 1/29: venous doppler +DVT  Today, 08/14/20  CBC: Hgb & Plt both WNL & stable  SCr 0.83, CrCl 75 mL/min  Last dose of enoxaparin 100 mg charted @1033  on 2/11.   Today is day #14 of therapeutic anticoagulation.    Plan:   Discontinue enoxaparin  Initiate Apixaban 5 mg PO BID. No loading dose since patient has received 14 days of therapeutic anticoagulation.  Patient will need education/coupon prior to discharge  Lenis Noon, PharmD 08/14/20 3:11  PM
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
## 2020-08-15 DIAGNOSIS — A419 Sepsis, unspecified organism: Secondary | ICD-10-CM | POA: Diagnosis not present

## 2020-08-15 DIAGNOSIS — J1282 Pneumonia due to coronavirus disease 2019: Secondary | ICD-10-CM

## 2020-08-15 DIAGNOSIS — Z515 Encounter for palliative care: Secondary | ICD-10-CM | POA: Diagnosis not present

## 2020-08-15 DIAGNOSIS — J9601 Acute respiratory failure with hypoxia: Secondary | ICD-10-CM | POA: Diagnosis not present

## 2020-08-15 DIAGNOSIS — Z7189 Other specified counseling: Secondary | ICD-10-CM | POA: Diagnosis not present

## 2020-08-15 DIAGNOSIS — U071 COVID-19: Secondary | ICD-10-CM | POA: Diagnosis not present

## 2020-08-15 LAB — GLUCOSE, CAPILLARY
Glucose-Capillary: 125 mg/dL — ABNORMAL HIGH (ref 70–99)
Glucose-Capillary: 130 mg/dL — ABNORMAL HIGH (ref 70–99)
Glucose-Capillary: 114 mg/dL — ABNORMAL HIGH (ref 70–99)
Glucose-Capillary: 108 mg/dL — ABNORMAL HIGH (ref 70–99)

## 2020-08-15 LAB — BASIC METABOLIC PANEL
Anion gap: 13 (ref 5–15)
BUN: 63 mg/dL — ABNORMAL HIGH (ref 8–23)
CO2: 26 mmol/L (ref 22–32)
Calcium: 9.2 mg/dL (ref 8.9–10.3)
Chloride: 96 mmol/L — ABNORMAL LOW (ref 98–111)
Creatinine, Ser: 0.92 mg/dL (ref 0.44–1.00)
GFR, Estimated: >60 mL/min (ref >60)
Glucose, Bld: 123 mg/dL — ABNORMAL HIGH (ref 70–99)
Potassium: 3.6 mmol/L (ref 3.5–5.1)
Sodium: 135 mmol/L (ref 135–145)

## 2020-08-15 MED ORDER — POLYETHYLENE GLYCOL 3350 17 G PO PACK
17.0000 g | PACK | Freq: Every day | ORAL | Status: DC
  Administered 2020-08-15 – 2020-09-23 (×29): 17 g via ORAL
  Filled 2020-08-15 (×36): qty 1

## 2020-08-15 NOTE — Progress Notes (Signed)
Pt wanting to delay BIPAP QHS due to waiting to have a BM.  RN to call when pt ready for bed.

## 2020-08-15 NOTE — Progress Notes (Addendum)
Occupational Therapy Treatment Patient Details Name: [name] MRN: [id number] DOB: 1951/05/25 Today's Date: 08/15/2020    History of present illness Pt is 70 year old female who has not been vaccinated against COVID-19 with a past medical history of OSA not on CPAP, NICM, chronic systolic heart failure, high PVC burden s/p radiofrequency catheter ablation with [name] on 07/18/2018, hypertension who presented to the ED with 2 days of fever, cough, dyspnea. Pt admitted for  Acute hypoxic respiratory failure and Severe Sepsis without septic shock secondary to COVID-19   OT comments  Treatment focused on promoting functional mobility and improving activity tolerance. However patient limited today due to complaints of dizziness at side of bed and o2 sats dropping as low as the 70s with coughing spell and the 80s  with sitting at edge of bed. Patient concerned about falling and reports "I haven't been out of bed for two days."Patient reporting "I can't catch my breath." Attempted to get BP but patient moving her arm to prop herself and an error was read. On return to supine patient's BP 99/72. Rn in room during treatment. Cont POC.   Follow Up Recommendations  SNF;Home health OT    Equipment Recommendations  None recommended by OT    Recommendations for Other Services      Precautions / Restrictions Precautions Precautions: Fall Precaution Comments: monitor vitals, currently on 25L HHFNC 90% FIO2 and 15L NRB, Restrictions Other Position/Activity Restrictions: pt with left wrist brace, states she is NWB to wrist       Mobility Bed Mobility Overal bed mobility: Needs Assistance Bed Mobility: Sidelying to Sit   Sidelying to sit: Min assist;HOB elevated Supine to sit: Min guard Sit to supine: Min guard   General bed mobility comments: Needed min assist for hand hold to pull up into sitting. Increased time. Resting on left elbow before lifting trunk up. Reports significant  dizziness at edge of bed - unable to obtain BP due to patient moving arm/ propping with arm. Unable to tolerate sitting and returned to supine. Patient attmepted to scoot herself towards head of bed - using bilateral hands and unable due to right wrist pain. RN and therapist pulled patient back up in bed.  Transfers                      Balance Overall balance assessment: Needs assistance Sitting-balance support: Feet supported Sitting balance-Leahy Scale: Poor Sitting balance - Comments: Reports dizzines at edge of bed, propping with arms.                                   ADL either performed or assessed with clinical judgement   ADL                                               Vision Patient Visual Report: No change from baseline     Perception     Praxis      Cognition Arousal/Alertness: Awake/alert Behavior During Therapy: WFL for tasks assessed/performed Overall Cognitive Status: Within Functional Limits for tasks assessed                                 General Comments: AxO x 3  very sweet        Exercises     Shoulder Instructions       General Comments      Pertinent Vitals/ Pain       Pain Assessment: Faces Faces Pain Scale: Hurts little more Pain Location: R wrist Pain Descriptors / Indicators: Grimacing Pain Intervention(s): Limited activity within patient's tolerance  Home Living                                          Prior Functioning/Environment              Frequency  Min 2X/week        Progress Toward Goals  OT Goals(current goals can now be found in the care plan section)  Progress towards OT goals: OT to reassess next treatment  Acute Rehab OT Goals Patient Stated Goal: to be independent OT Goal Formulation: With patient Time For Goal Achievement: 08/24/20 Potential to Achieve Goals: Good  Plan Discharge plan remains appropriate     Co-evaluation                 AM-PAC OT "6 Clicks" Daily Activity     Outcome Measure   Help from another person eating meals?: None Help from another person taking care of personal grooming?: A Little Help from another person toileting, which includes using toliet, bedpan, or urinal?: A Lot Help from another person bathing (including washing, rinsing, drying)?: A Lot Help from another person to put on and taking off regular upper body clothing?: A Little Help from another person to put on and taking off regular lower body clothing?: A Lot 6 Click Score: 16    End of Session Equipment Utilized During Treatment: Oxygen  OT Visit Diagnosis: Other abnormalities of gait and mobility (R26.89);Muscle weakness (generalized) (M62.81)   Activity Tolerance Patient limited by fatigue   Patient Left in bed;with call bell/phone within reach   Nurse Communication Mobility status        Time: 1352-1416 OT Time Calculation (min): 24 min  Charges: OT General Charges $OT Visit: 1 Visit OT Treatments $Therapeutic Activity: 23-37 mins  Shanquita Ronning, OTR/L [place]  Office [phone] Pager: [place] 08/15/2020, 2:26 PM
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
## 2020-08-16 DIAGNOSIS — J9601 Acute respiratory failure with hypoxia: Secondary | ICD-10-CM | POA: Diagnosis not present

## 2020-08-16 DIAGNOSIS — U071 COVID-19: Secondary | ICD-10-CM | POA: Diagnosis not present

## 2020-08-16 LAB — GLUCOSE, CAPILLARY
Glucose-Capillary: 144 mg/dL — ABNORMAL HIGH (ref 70–99)
Glucose-Capillary: 112 mg/dL — ABNORMAL HIGH (ref 70–99)
Glucose-Capillary: 116 mg/dL — ABNORMAL HIGH (ref 70–99)
Glucose-Capillary: 104 mg/dL — ABNORMAL HIGH (ref 70–99)

## 2020-08-16 MED ORDER — SODIUM CHLORIDE 0.9 % IV SOLN: INTRAVENOUS | Status: AC
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
## 2020-08-17 DIAGNOSIS — U071 COVID-19: Secondary | ICD-10-CM | POA: Diagnosis not present

## 2020-08-17 DIAGNOSIS — J9601 Acute respiratory failure with hypoxia: Secondary | ICD-10-CM | POA: Diagnosis not present

## 2020-08-17 LAB — GLUCOSE, CAPILLARY
Glucose-Capillary: 126 mg/dL — ABNORMAL HIGH (ref 70–99)
Glucose-Capillary: 136 mg/dL — ABNORMAL HIGH (ref 70–99)
Glucose-Capillary: 142 mg/dL — ABNORMAL HIGH (ref 70–99)
Glucose-Capillary: 163 mg/dL — ABNORMAL HIGH (ref 70–99)
Glucose-Capillary: 110 mg/dL — ABNORMAL HIGH (ref 70–99)

## 2020-08-17 MED ORDER — SODIUM CHLORIDE 0.9 % IV SOLN: INTRAVENOUS | Status: AC

## 2020-08-17 MED ORDER — MECLIZINE HCL 25 MG PO TABS
25.0000 mg | ORAL_TABLET | Freq: Three times a day (TID) | ORAL | Status: DC | PRN
  Administered 2020-08-18 – 2020-09-23 (×36): 25 mg via ORAL
  Filled 2020-08-17 (×39): qty 1

## 2020-08-17 NOTE — Progress Notes (Signed)
[place] for Apixaban Indication: VTE treatment  Allergies  Allergen Reactions  . Penicillins Anaphylaxis and Swelling    Has patient had a PCN reaction causing immediate rash, facial/tongue/throat swelling, SOB or lightheadedness with hypotension: Yes Has patient had a PCN reaction causing severe rash involving mucus membranes or skin necrosis: No Has patient had a PCN reaction that required hospitalization: Yes Has patient had a PCN reaction occurring within the last 10 years: No    . Shellfish-Derived Products Anaphylaxis and Swelling  . Sulfa Antibiotics Anaphylaxis and Swelling  . Codeine Nausea Only  . Morphine Nausea And Vomiting  . Morphine And Related Nausea And Vomiting    Patient Measurements: Height: 5\' 5"  (165.1 cm) Weight: 99.8 kg (220 lb) IBW/kg (Calculated) : 57  Vital Signs: Temp: 96.1 F (35.6 C) (02/14 0855) Temp Source: Oral (02/14 0855) BP: 138/75 (02/14 1200) Pulse Rate: 99 (02/14 1426)  Labs: Recent Labs    08/15/20 0532  CREATININE 0.92    Estimated Creatinine Clearance: 67.5 mL/min (by C-G formula based on SCr of 0.92 mg/dL).  Medications: No PTA anticoagulation listed  Assessment: Pharmacy consulted to transition anticoagulation from therapeutic enoxaparin to apixaban for VTE treatment. Patient has been on therapeutic LMWH from 1/29 > 2/11.   1/29: CTa negative for PE 1/29: venous doppler +DVT  Today, 08/17/20  CBC (2/10): Hgb & Plt both WNL & stable  SCr (2/12): 0.92, CrCl 67 mL/min  No drug interactions noted  No bleeding complications documented  Plan:   Continue Apixaban 5 mg PO BID.   No dose adjustment needed, pharmacy will sign-off note writing and monitor peripherally  We will provide education/coupon prior to discharge  Peggyann Juba, PharmD, [place]: [phone] 08/17/20 2:27 PM

## 2020-08-17 NOTE — Progress Notes (Signed)
Physical Therapy Treatment Patient Details Name: [name] MRN: [id number] DOB: 20-Feb-1951 Today's Date: 08/17/2020    History of Present Illness Pt is 70 year old female who has not been vaccinated against COVID-19 with a past medical history of OSA not on CPAP, NICM, chronic systolic heart failure, high PVC burden s/p radiofrequency catheter ablation with [name] on 07/18/2018, hypertension who presented to the ED with 2 days of fever, cough, dyspnea. Pt admitted for  Acute hypoxic respiratory failure and Severe Sepsis without septic shock secondary to COVID-19.    PT Comments    Patient's activity limited by complaints of dizziness. Patient reports having a history of vertigo, takes Meclazine frequently PTA. Has been through OP vestibular therapy without success..  Patient desats to 78% with mobilizing to sitting on side of bed x ~ 1 minute.  Slow return to 89% with breathing techniques and relaxation, lights off.  Continue progressive activity as tolerated.  Follow Up Recommendations  SNF     Equipment Recommendations  None recommended by PT    Recommendations for Other Services       Precautions / Restrictions Precautions Precautions: Fall Precaution Comments: monitor vitals, currently on 35L HHFNC 90% FIO2 and 15L NRB, Restrictions Other Position/Activity Restrictions: pt with left wrist brace, states she is NWB to wrist, pt reports H/O vertigo, takes meclazine routinely    Mobility  Bed Mobility   Bed Mobility: Rolling;Sidelying to Sit;Sit to Sidelying Rolling: Supervision Sidelying to sit: Min assist;HOB elevated     Sit to sidelying: Min assist General bed mobility comments: Needed min assist for hand hold to pull up into sitting. Increased time. Resting on left elbow before lifting trunk up. Reports significant dizziness when rolling" The room is spinning".at edge of bed -Sat less than a minute, able to lean to L/R for fresh bed pad to be placed.     Transfers                 General transfer comment: unable, "I am too Dizzy."  Ambulation/Gait                 Stairs             Wheelchair Mobility    Modified Rankin (Stroke Patients Only)       Balance Overall balance assessment: Needs assistance Sitting-balance support: Feet supported Sitting balance-Leahy Scale: Fair Sitting balance - Comments: Reports dizzines at edge of bed, propping with arms.                                    Cognition Arousal/Alertness: Awake/alert Behavior During Therapy: Anxious Overall Cognitive Status: Within Functional Limits for tasks assessed                                 General Comments: Patient stated"I am having a panic attack"      Exercises      General Comments        Pertinent Vitals/Pain Faces Pain Scale: Hurts little more Pain Location: abdomen" I need to have a BM" Pain Descriptors / Indicators: Tightness;Cramping Pain Intervention(s): Monitored during session;Limited activity within patient's tolerance    Home Living                      Prior Function  PT Goals (current goals can now be found in the care plan section) Acute Rehab PT Goals Patient Stated Goal: to get up PT Goal Formulation: With patient Time For Goal Achievement: 08/31/20 Potential to Achieve Goals: Fair Progress towards PT goals: Not progressing toward goals - comment (limited by ongoing dizziness and respiratory status.)    Frequency    Min 2X/week      PT Plan Current plan remains appropriate    Co-evaluation              AM-PAC PT "6 Clicks" Mobility   Outcome Measure  Help needed turning from your back to your side while in a flat bed without using bedrails?: A Little Help needed moving from lying on your back to sitting on the side of a flat bed without using bedrails?: A Little Help needed moving to and from a bed to a chair (including a  wheelchair)?: A Lot Help needed standing up from a chair using your arms (e.g., wheelchair or bedside chair)?: A Lot Help needed to walk in hospital room?: Total Help needed climbing 3-5 steps with a railing? : Total 6 Click Score: 12    End of Session Equipment Utilized During Treatment: Oxygen Activity Tolerance: Treatment limited secondary to medical complications (Comment) Patient left: with call bell/phone within reach;in chair Nurse Communication: Mobility status (needs sheet changed.) PT Visit Diagnosis: Difficulty in walking, not elsewhere classified (R26.2)     Time: 1660-6004 PT Time Calculation (min) (ACUTE ONLY): 45 min  Charges:  $Therapeutic Activity: 38-52 mins                     Tresa Endo PT Acute Rehabilitation Services Pager [phone] Office [phone]    Claretha Cooper 08/17/2020, 4:44 PM

## 2020-08-17 NOTE — TOC Progression Note (Signed)
Transition of Care [facility]) - Progression Note    Patient Details  Name: [name] MRN: [id number] Date of Birth: [birth year]  Transition of Care [facility]) CM/SW Contact  Leeroy Cha, RN Phone Number: 08/17/2020, 8:03 AM  Clinical Narrative:    Assessment/Plan: COVID pneumonia Persistent acute respiratory failure with hypoxia  -Presented with fever, cough, dyspnea -COVID test: PCR positive on admission -Chest imaging: CT angio chest showed diffuse bilateral groundglass opacities compatible with diffuse pneumonia.  -Treatment: Patient completed 5-day course of IV remdesivir.  She got a dose of Actemra on 08/01/2020.  For last several days, patient has been receiving IV Solu-Medrol 60 mg twice daily.  Continue the same.   -Patient also received IV Lasix 40 mg daily for several days.  Stopped on 2/12.   -Yesterday, we had her on 20 L of oxygen.  In the night, she requires higher flow up to 40 L.  This morning I titrated her down to 25 L/min and she was able to maintain oxygen saturation close to 90%. RN at bedside.  She will continue titrating it further down today -Nightly BiPAP -For now, continue supportive care: Vitamin C, Zinc, PRN inhalers, Tylenol, Antitussives (benzonatate/ Mucinex/Tussionex).   -Encouraged incentive spirometry, prone position, out of bed and early mobilization as much as possible -Continue airborne/contact isolation precautions for duration of 3 weeks from the day of diagnosis. -WBC and inflammatory markers trend as below. PLAN: FOLLOWING FOR PROGRESSION, FROM HOME PLAN IS TO RETURN TO HOME  Expected Discharge Plan: Home/Self Care Barriers to Discharge: No Barriers Identified  Expected Discharge Plan and Services Expected Discharge Plan: Home/Self Care In-house Referral: Clinical Social Work     Living arrangements for the past 2 months: Single Family Home                                       Social Determinants of Health (SDOH)  Interventions    Readmission Risk Interventions No flowsheet data found.

## 2020-08-17 NOTE — Progress Notes (Signed)
Patient has not had a bowel movement since 2-2, patient receiving daily miralax without success. Patient given PRN bisacodyl. MD made aware. New orders received for soap suds enema. Patient notified of plan but stated she would like to wait before getting the enema because she ate lunch and "feels something is coming soon." Will pass on report to night shift RN. Will continue to monitor.
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
## 2020-08-17 NOTE — Progress Notes (Signed)
Delay in placing pt on BIPAP due to pt waiting to get an enema.  RN to notify RT when pt is ready for bed.

## 2020-08-18 DIAGNOSIS — U071 COVID-19: Secondary | ICD-10-CM | POA: Diagnosis not present

## 2020-08-18 DIAGNOSIS — J9601 Acute respiratory failure with hypoxia: Secondary | ICD-10-CM | POA: Diagnosis not present

## 2020-08-18 LAB — GLUCOSE, CAPILLARY
Glucose-Capillary: 107 mg/dL — ABNORMAL HIGH (ref 70–99)
Glucose-Capillary: 101 mg/dL — ABNORMAL HIGH (ref 70–99)
Glucose-Capillary: 181 mg/dL — ABNORMAL HIGH (ref 70–99)
Glucose-Capillary: 114 mg/dL — ABNORMAL HIGH (ref 70–99)
Glucose-Capillary: 102 mg/dL — ABNORMAL HIGH (ref 70–99)

## 2020-08-18 MED ORDER — SODIUM CHLORIDE 0.9 % IV SOLN: INTRAVENOUS | Status: DC

## 2020-08-18 MED ORDER — SENNOSIDES-DOCUSATE SODIUM 8.6-50 MG PO TABS
1.0000 | ORAL_TABLET | Freq: Every day | ORAL | Status: DC
  Administered 2020-08-18 – 2020-09-22 (×29): 1 via ORAL
  Filled 2020-08-18 (×34): qty 1

## 2020-08-18 NOTE — Progress Notes (Addendum)
Occupational Therapy Treatment Patient Details Name: [name] MRN: [id number] DOB: 11/27/1950 Today's Date: 08/18/2020    History of present illness Pt is 70 year old female who has not been vaccinated against COVID-19 with a past medical history of OSA not on CPAP, NICM, chronic systolic heart failure, high PVC burden s/p radiofrequency catheter ablation with [name] on 07/18/2018, hypertension who presented to the ED with 2 days of fever, cough, dyspnea. Pt admitted for  Acute hypoxic respiratory failure and Severe Sepsis without septic shock secondary to COVID-19.   OT comments  Pt able to tolerate scoot transfer to drop arm recliner with mod A this date.  She requires increased time to complete task due to distractibility and increased time to process info.  MD present at beginning of session and decreased 02 to 25L, 90% Fi02 + NRB.  She tolerated activity well on this setting with Sp02 decreasing to 73% with activity, but rebounding to low 90s quickly with pursed lip breathing and Sp02 was 98% at rest sitting in the chair at the end of session.   Progress to date has been slow, and it appears she is requiring increased assistance this date compared to how she performed at time of initial eval.  Her goals have been downgraded, and disposition changed to SNF for rehab at discharge (vs. LTACH if she is unable to wean).  Will continue to follow.   Follow Up Recommendations  SNF Vs. LTACH   Equipment Recommendations  None recommended by OT    Recommendations for Other Services      Precautions / Restrictions Precautions Precautions: Fall Precaution Comments: monitor vitals, currently on 25L HHFNC 90% FIO2 and 15L NRB, Restrictions Weight Bearing Restrictions: Yes LUE Weight Bearing: Weight bear through elbow only (Pt with wrist fracture. Per he report she is not to put weight through Lt wrist) Other Position/Activity Restrictions: pt with left wrist brace, states she is NWB to wrist,  pt reports H/O vertigo, takes meclazine routinely       Mobility Bed Mobility Overal bed mobility: Needs Assistance Bed Mobility: Supine to Sit     Supine to sit: Min assist;HOB elevated     General bed mobility comments: requires physical assist to initiate activity and to lift trunk from bed  Transfers Overall transfer level: Needs assistance Equipment used: 1 person hand held assist Transfers: Sit to/from Stand;Lateral/Scoot Transfers          Lateral/Scoot Transfers: Mod assist General transfer comment: Attempted to stand x 3, but pt unable to achieve due to being distracted.  She performed scoot transfer to drop arm recliner with mod A and mod verbal cues for problem solving and sequencing    Balance Overall balance assessment: Needs assistance Sitting-balance support: Feet supported Sitting balance-Leahy Scale: Fair Sitting balance - Comments: able to maintain static sitting with min guard assist       Standing balance comment: pt unable to achieve standing this date due to being too distracted by the need to urinate                           ADL either performed or assessed with clinical judgement   ADL Overall ADL's : Needs assistance/impaired                         Toilet Transfer: Squat-pivot;Moderate assistance Toilet Transfer Details (indicate cue type and reason): mod verbal cues for sequencing and problem  solving         Functional mobility during ADLs: Moderate assistance       Vision       Perception     Praxis      Cognition Arousal/Alertness: Awake/alert Behavior During Therapy: WFL for tasks assessed/performed Overall Cognitive Status: Impaired/Different from baseline Area of Impairment: Attention;Following commands;Safety/judgement;Awareness;Problem solving                   Current Attention Level: Selective (with cues)   Following Commands: Follows one step commands consistently;Follows multi-step  commands inconsistently Safety/Judgement: Decreased awareness of deficits Awareness: Intellectual Problem Solving: Slow processing;Difficulty sequencing;Requires verbal cues;Requires tactile cues;Decreased initiation General Comments: Pt asked if she were in the attic.  She knows she is in a hospital, but decreased  awareness of complexity of situation.  Internally distracted requiring mod cues to stay on task.  Mod cues for problem solving and sequencing.  Stated "I wonder why I am having such a hard time breathing?"  Is oriented to date        Exercises     Shoulder Instructions       General Comments Pt on 25L HHF with 90% Fi02 + NRB with Sp02 decreasing to 73% with activity, but quickly rebounding to low 90s within 1.5 mins and pt performing pursed lip breathing.  Sp02 at rest 98%    Pertinent Vitals/ Pain       Pain Assessment: Faces Faces Pain Scale: Hurts little more Pain Location: Lt wrist - encouraged her to wear splint and avoid Weight bearing Pain Intervention(s): Monitored during session;Repositioned  Home Living                                          Prior Functioning/Environment              Frequency  Min 2X/week        Progress Toward Goals  OT Goals(current goals can now be found in the care plan section)  Progress towards OT goals: Goals drowngraded-see care plan (slow progress)  ADL Goals Pt Will Perform Lower Body Dressing: with mod assist;sit to/from stand Pt Will Transfer to Toilet: with min assist;stand pivot transfer;bedside commode Pt Will Perform Toileting - Clothing Manipulation and hygiene: with mod assist;sit to/from stand Pt/caregiver will Perform Home Exercise Program: Increased strength;Both right and left upper extremity;With theraband;Independently  Plan Discharge plan needs to be updated;Discharge plan remains appropriate    Co-evaluation                 AM-PAC OT "6 Clicks" Daily Activity     Outcome  Measure   Help from another person eating meals?: None Help from another person taking care of personal grooming?: A Little Help from another person toileting, which includes using toliet, bedpan, or urinal?: A Lot Help from another person bathing (including washing, rinsing, drying)?: A Lot Help from another person to put on and taking off regular upper body clothing?: A Lot Help from another person to put on and taking off regular lower body clothing?: A Lot 6 Click Score: 15    End of Session Equipment Utilized During Treatment: Oxygen  OT Visit Diagnosis: Other abnormalities of gait and mobility (R26.89);Muscle weakness (generalized) (M62.81)   Activity Tolerance Patient limited by fatigue   Patient Left in chair;with call bell/phone within reach;with chair alarm set   Nurse Communication  Mobility status;Need for lift equipment        Time: [phone] OT Time Calculation (min): 34 min  Charges: OT General Charges $OT Visit: 1 Visit OT Treatments $Therapeutic Activity: 23-37 mins  Nilsa Nutting., OTR/L Acute Rehabilitation Services Pager [phone] Office [phone]    [name] 08/18/2020, 10:12 AM
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
## 2020-08-18 NOTE — Progress Notes (Signed)
RT NOTE:  Pt taken off BiPAP and placed on HHFNC/PRB per pt request. Pt tolerating this change well at this time. RT will continue to monitor.

## 2020-08-18 NOTE — Progress Notes (Signed)
Pt received two soap sud enemas on this shift. Pt states that she feels little relief. Will continue to monitor.

## 2020-08-19 DIAGNOSIS — U071 COVID-19: Secondary | ICD-10-CM | POA: Diagnosis not present

## 2020-08-19 DIAGNOSIS — J9601 Acute respiratory failure with hypoxia: Secondary | ICD-10-CM | POA: Diagnosis not present

## 2020-08-19 LAB — GLUCOSE, CAPILLARY
Glucose-Capillary: 87 mg/dL (ref 70–99)
Glucose-Capillary: 96 mg/dL (ref 70–99)
Glucose-Capillary: 106 mg/dL — ABNORMAL HIGH (ref 70–99)
Glucose-Capillary: 71 mg/dL (ref 70–99)

## 2020-08-19 NOTE — Progress Notes (Signed)
Afternoon bladder scan showed 668 mLs of urine. This would be the 3rd In-and-out cath in 24 hrs. Dr. Pietro Cassis notified by RN; orders received to place foley catheter for retention. Upon insertion, 1150 mLs of yellow urine drained. This RN will continue to monitor.

## 2020-08-19 NOTE — Progress Notes (Signed)
Physical Therapy Treatment Patient Details Name: [name] MRN: [id number] DOB: [birth year] Today's Date: 08/19/2020    History of Present Illness Pt is 70 year old female who has not been vaccinated against COVID-19 with a past medical history of OSA not on CPAP, NICM, chronic systolic heart failure, high PVC burden s/p radiofrequency catheter ablation with [name] on 07/18/2018, hypertension who presented to the ED with 2 days of fever, cough, dyspnea. Pt admitted for  Acute hypoxic respiratory failure and Severe Sepsis without septic shock secondary to COVID-19.    PT Comments    The patient continues with reports of dizziness but did appear to tolerate activity more than  PT previous visit.Patient had antivert at 900. Patient did mobilize and sit side ways/elbow propped on bed x 10 mins, sat upright x 1 briefly.  Patient's SPO@  At rest 95% on 15 L/85% HHFNC and 15 L NRB. SPO2 lowest 78, maintained at ! 81-85% while sitting. Drops when she talks. Continue PT as tolerated.  Follow Up Recommendations  SNF     Equipment Recommendations  None recommended by PT    Recommendations for Other Services       Precautions / Restrictions Precautions Precautions: Fall Precaution Comments: monitor vitals, currently on 15L HHFNC 85% FIO2 and 15L NRB,    Mobility  Bed Mobility   Bed Mobility: Rolling Rolling: Supervision Sidelying to sit: Min assist;HOB elevated     Sit to sidelying: Min guard General bed mobility comments: patient sat briefly upright then back to side sitting x 10 mins    Transfers                    Ambulation/Gait                 Stairs             Wheelchair Mobility    Modified Rankin (Stroke Patients Only)       Balance Overall balance assessment: Needs assistance Sitting-balance support: Feet supported Sitting balance-Leahy Scale: Fair                                      Cognition Arousal/Alertness:  Awake/alert Behavior During Therapy: WFL for tasks assessed/performed                           Following Commands: Follows one step commands consistently;Follows multi-step commands inconsistently              Exercises      General Comments        Pertinent Vitals/Pain Faces Pain Scale: Hurts little more Pain Location: abdomen Lt wrist - encouraged her to wear splint and avoid Weight bearing Pain Descriptors / Indicators: Tightness;Cramping;Pressure Pain Intervention(s): Monitored during session    Home Living                      Prior Function            PT Goals (current goals can now be found in the care plan section) Progress towards PT goals: Progressing toward goals    Frequency    Min 2X/week      PT Plan Current plan remains appropriate    Co-evaluation              AM-PAC PT "6 Clicks" Mobility   Outcome Measure  Help needed turning from your back to your side while in a flat bed without using bedrails?: A Little Help needed moving from lying on your back to sitting on the side of a flat bed without using bedrails?: A Little Help needed moving to and from a bed to a chair (including a wheelchair)?: A Lot Help needed standing up from a chair using your arms (e.g., wheelchair or bedside chair)?: A Lot Help needed to walk in hospital room?: Total Help needed climbing 3-5 steps with a railing? : Total 6 Click Score: 12    End of Session Equipment Utilized During Treatment: Oxygen Activity Tolerance: Patient tolerated treatment well Patient left: with call bell/phone within reach;in chair Nurse Communication: Mobility status PT Visit Diagnosis: Difficulty in walking, not elsewhere classified (R26.2)     Time: 5427-0623 PT Time Calculation (min) (ACUTE ONLY): 30 min  Charges:  $Therapeutic Activity: 23-37 mins                     [place] Pager [phone] Office  [phone]    Claretha Cooper 08/19/2020, 4:21 PM
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
## 2020-08-20 DIAGNOSIS — U071 COVID-19: Secondary | ICD-10-CM | POA: Diagnosis not present

## 2020-08-20 DIAGNOSIS — J9601 Acute respiratory failure with hypoxia: Secondary | ICD-10-CM | POA: Diagnosis not present

## 2020-08-20 LAB — CBC WITH DIFFERENTIAL/PLATELET
Abs Immature Granulocytes: 0.21 10*3/uL — ABNORMAL HIGH (ref 0.00–0.07)
Basophils Absolute: 0 10*3/uL (ref 0.0–0.1)
Basophils Relative: 0 %
Eosinophils Absolute: 0 10*3/uL (ref 0.0–0.5)
Eosinophils Relative: 0 %
HCT: 39 % (ref 36.0–46.0)
Hemoglobin: 12.9 g/dL (ref 12.0–15.0)
Immature Granulocytes: 2 %
Lymphocytes Relative: 2 %
Lymphs Abs: 0.2 10*3/uL — ABNORMAL LOW (ref 0.7–4.0)
MCH: 28.8 pg (ref 26.0–34.0)
MCHC: 33.1 g/dL (ref 30.0–36.0)
MCV: 87.1 fL (ref 80.0–100.0)
Monocytes Absolute: 0.3 10*3/uL (ref 0.1–1.0)
Monocytes Relative: 3 %
Neutro Abs: 9.9 10*3/uL — ABNORMAL HIGH (ref 1.7–7.7)
Neutrophils Relative %: 93 %
Platelets: 118 10*3/uL — ABNORMAL LOW (ref 150–400)
RBC: 4.48 MIL/uL (ref 3.87–5.11)
RDW: 15.3 % (ref 11.5–15.5)
WBC: 10.7 10*3/uL — ABNORMAL HIGH (ref 4.0–10.5)
nRBC: 0 % (ref 0.0–0.2)

## 2020-08-20 LAB — BASIC METABOLIC PANEL
Anion gap: 8 (ref 5–15)
BUN: 25 mg/dL — ABNORMAL HIGH (ref 8–23)
CO2: 22 mmol/L (ref 22–32)
Calcium: 8.8 mg/dL — ABNORMAL LOW (ref 8.9–10.3)
Chloride: 107 mmol/L (ref 98–111)
Creatinine, Ser: 0.66 mg/dL (ref 0.44–1.00)
GFR, Estimated: >60 mL/min (ref >60)
Glucose, Bld: 78 mg/dL (ref 70–99)
Potassium: 4 mmol/L (ref 3.5–5.1)
Sodium: 137 mmol/L (ref 135–145)

## 2020-08-20 LAB — GLUCOSE, CAPILLARY
Glucose-Capillary: 103 mg/dL — ABNORMAL HIGH (ref 70–99)
Glucose-Capillary: 48 mg/dL — ABNORMAL LOW (ref 70–99)
Glucose-Capillary: 78 mg/dL (ref 70–99)
Glucose-Capillary: 88 mg/dL (ref 70–99)
Glucose-Capillary: 89 mg/dL (ref 70–99)

## 2020-08-20 LAB — PHOSPHORUS: Phosphorus: 2.3 mg/dL — ABNORMAL LOW (ref 2.5–4.6)

## 2020-08-20 LAB — MAGNESIUM: Magnesium: 2.1 mg/dL (ref 1.7–2.4)

## 2020-08-20 MED ORDER — INSULIN DETEMIR 100 UNIT/ML ~~LOC~~ SOLN
0.1000 [IU]/kg | Freq: Two times a day (BID) | SUBCUTANEOUS | Status: DC
  Administered 2020-08-20 – 2020-08-21 (×2): 10 [IU] via SUBCUTANEOUS
  Filled 2020-08-20 (×2): qty 0.1

## 2020-08-20 MED ORDER — K PHOS MONO-SOD PHOS DI & MONO 155-852-130 MG PO TABS
500.0000 mg | ORAL_TABLET | Freq: Once | ORAL | Status: AC
  Administered 2020-08-20: 500 mg via ORAL
  Filled 2020-08-20: qty 2

## 2020-08-20 NOTE — Progress Notes (Signed)
Hypoglycemic Event  CBG: 48  Treatment: 8 oz orange juice Symptoms: drowsy  Follow-up CBG: Time:12:10 CBG Result: 78  Possible Reasons for Event: pt eating 40% or less of meals  Comments/MD notified:yes    Lynnea Maizes Garcia-Contreras

## 2020-08-20 NOTE — Progress Notes (Signed)
Inpatient Diabetes Program Recommendations  AACE/ADA: New Consensus Statement on Inpatient Glycemic Control (2015)  Target Ranges:  Prepandial:   less than 140 mg/dL      Peak postprandial:   less than 180 mg/dL (1-2 hours)      Critically ill patients:  140 - 180 mg/dL   Lab Results  Component Value Date   GLUCAP 78 08/20/2020   HGBA1C 6.0 (H) 08/01/2020    Review of Glycemic Control Results for KATRICE, GOEL A (MRN [id number]) as of 08/20/2020 12:45  Ref. Range 08/19/2020 07:48 08/19/2020 11:27 08/19/2020 16:20 08/19/2020 21:22 08/20/2020 08:24 08/20/2020 11:43 08/20/2020 12:06  Glucose-Capillary Latest Ref Range: 70 - 99 mg/dL 87 96 106 (H) 71 103 (H) 48 (L) 78   Current orders for Inpatient glycemic control:  Levemir 15 units BID Novolog 0-15 units TID Novolog 4 units TID with meals Tradjenta 5 mg daily Solumedrol 60 mg q12H   Inpatient Diabetes Program Recommendations:     Hypoglycemia at 11:43 today.  CBG's consistently running on lower side.  Please consider decreasing Levemir to 10 BID.  Will continue to follow while inpatient.  Thank you, Reche Dixon, RN, BSN Diabetes Coordinator Inpatient Diabetes Program [phone] (team pager from 8a-5p)
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
## 2020-08-21 ENCOUNTER — Inpatient Hospital Stay (HOSPITAL_COMMUNITY): Payer: Medicare HMO

## 2020-08-21 DIAGNOSIS — J9601 Acute respiratory failure with hypoxia: Secondary | ICD-10-CM | POA: Diagnosis not present

## 2020-08-21 DIAGNOSIS — U071 COVID-19: Secondary | ICD-10-CM | POA: Diagnosis not present

## 2020-08-21 LAB — CBC WITH DIFFERENTIAL/PLATELET
Abs Immature Granulocytes: 0.09 10*3/uL — ABNORMAL HIGH (ref 0.00–0.07)
Basophils Absolute: 0 10*3/uL (ref 0.0–0.1)
Basophils Relative: 0 %
Eosinophils Absolute: 0 10*3/uL (ref 0.0–0.5)
Eosinophils Relative: 0 %
HCT: 36.1 % (ref 36.0–46.0)
Hemoglobin: 12.2 g/dL (ref 12.0–15.0)
Immature Granulocytes: 1 %
Lymphocytes Relative: 3 %
Lymphs Abs: 0.3 10*3/uL — ABNORMAL LOW (ref 0.7–4.0)
MCH: 28.8 pg (ref 26.0–34.0)
MCHC: 33.8 g/dL (ref 30.0–36.0)
MCV: 85.3 fL (ref 80.0–100.0)
Monocytes Absolute: 0.3 10*3/uL (ref 0.1–1.0)
Monocytes Relative: 3 %
Neutro Abs: 8.4 10*3/uL — ABNORMAL HIGH (ref 1.7–7.7)
Neutrophils Relative %: 93 %
Platelets: 107 10*3/uL — ABNORMAL LOW (ref 150–400)
RBC: 4.23 MIL/uL (ref 3.87–5.11)
RDW: 15.5 % (ref 11.5–15.5)
WBC: 9.1 10*3/uL (ref 4.0–10.5)
nRBC: 0 % (ref 0.0–0.2)

## 2020-08-21 LAB — BASIC METABOLIC PANEL
Anion gap: 5 (ref 5–15)
BUN: 28 mg/dL — ABNORMAL HIGH (ref 8–23)
CO2: 26 mmol/L (ref 22–32)
Calcium: 8.7 mg/dL — ABNORMAL LOW (ref 8.9–10.3)
Chloride: 108 mmol/L (ref 98–111)
Creatinine, Ser: 0.7 mg/dL (ref 0.44–1.00)
GFR, Estimated: >60 mL/min (ref >60)
Glucose, Bld: 103 mg/dL — ABNORMAL HIGH (ref 70–99)
Potassium: 4.3 mmol/L (ref 3.5–5.1)
Sodium: 139 mmol/L (ref 135–145)

## 2020-08-21 LAB — GLUCOSE, CAPILLARY
Glucose-Capillary: 83 mg/dL (ref 70–99)
Glucose-Capillary: 125 mg/dL — ABNORMAL HIGH (ref 70–99)
Glucose-Capillary: 185 mg/dL — ABNORMAL HIGH (ref 70–99)
Glucose-Capillary: 118 mg/dL — ABNORMAL HIGH (ref 70–99)

## 2020-08-21 MED ORDER — INSULIN DETEMIR 100 UNIT/ML ~~LOC~~ SOLN
0.1000 [IU]/kg | Freq: Every day | SUBCUTANEOUS | Status: DC
  Administered 2020-08-22 – 2020-09-01 (×11): 10 [IU] via SUBCUTANEOUS
  Filled 2020-08-21 (×12): qty 0.1
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
## 2020-08-21 NOTE — Progress Notes (Signed)
Physical Therapy Treatment Patient Details Name: [name] MRN: [id number] DOB: 1951/01/09 Today's Date: 08/21/2020    History of Present Illness Pt is 70 year old female who has not been vaccinated against COVID-19 with a past medical history of OSA not on CPAP, NICM, chronic systolic heart failure, high PVC burden s/p radiofrequency catheter ablation with [name] on 07/18/2018, hypertension who presented to the ED with 2 days of fever, cough, dyspnea. Pt admitted for  Acute hypoxic respiratory failure and Severe Sepsis without septic shock secondary to COVID-19.  Treatment focused on improving activity tolerance. Patient now on 25 L HHFNC and 15 L PNB. Patient complaining of dizziness at rest but agreeable to get out of bed. Patient required sidelying to sit positioning - with resting break between transitioning into sitting in order to tolerate dizziness and o2 sat drop. Patient sat at side of bed for prolonged amount of time to recover after sats dropped to 79% and dizziness. Patient reporting chest pain/pressure and wanting chest percussion - feeling like something is stuck in her chest. Patient provided verbal cues for breathing technique - though patient unable to accurately perform tending to pant through mouth. Patient very fearful of falling and needing to people to feel safe. Patient required min assist x 2 to stand and pivot to recliner. O2 sats dropped to 72% and recovered to 84-86% while therapist in the room. Rn notified of patient's complaints of chest pain. Patient continues to require high levels of oxygenation and her prolonged hospital stay has significantly weakened her. Recommend short term rehab at discharge.  PT Comments       Follow Up Recommendations  SNF;LTACH     Equipment Recommendations  None recommended by PT    Recommendations for Other Services       Precautions / Restrictions Precautions Precautions: Fall Precaution Comments: monitor vitals, currently  on 25L HHFNC 85% FIO2 and 15L PNB, Restrictions Weight Bearing Restrictions: No LUE Weight Bearing: Weight bearing as tolerated Other Position/Activity Restrictions: pt with left wrist brace, states she is NWB to wrist, pt reports H/O vertigo, takes meclazine routinely    Mobility  Bed Mobility Overal bed mobility: Needs Assistance Bed Mobility: Rolling;Sidelying to Sit Rolling: Supervision Sidelying to sit: Min guard Supine to sit: Min assist;HOB elevated Sit to supine: Min guard   General bed mobility comments: Patient reports increased vertigo symptoms today - dizziness at rest and with movement. Patient able to roll left and right without significant dizziness - though does report some with roll the right. Patient able to place herself in sidelying. Needed to stay in position prior to sitting up to manage dizziness.    Transfers Overall transfer level: Needs assistance Equipment used: 2 person hand held assist Transfers: Sit to/from [facility] Sit to Stand: Min assist;+2 physical assistance;+2 safety/equipment Stand pivot transfers: Min assist;+2 physical assistance;+2 safety/equipment;From elevated surface       General transfer comment: Patient fearful of falling due to not being out of bed for significant amount of time, dizziness, and weakness. Min x 2 to stand and pivot tor recliner. O2 sat dropped to 73%. Recovered to 84-86%. Left in recliner with lift pad underneath her for safety.  Ambulation/Gait             General Gait Details: Stand pvt only due to dyspnea and current O2 demand   Stairs             Wheelchair Mobility    Modified Rankin (Stroke Patients Only)  Balance Overall balance assessment: Needs assistance Sitting-balance support: Single extremity supported Sitting balance-Leahy Scale: Fair Sitting balance - Comments: min guard to min assist for balance due to dizziness.   Standing balance support: During  functional activity Standing balance-Leahy Scale: Poor Standing balance comment: Needs external support.                            Cognition Arousal/Alertness: Awake/alert Behavior During Therapy: WFL for tasks assessed/performed Overall Cognitive Status: Within Functional Limits for tasks assessed                                        Exercises      General Comments        Pertinent Vitals/Pain Pain Assessment: Faces Faces Pain Scale: Hurts little more Pain Location: chest pressure Pain Descriptors / Indicators: Grimacing;Discomfort Pain Intervention(s): Limited activity within patient's tolerance;Monitored during session    Home Living                      Prior Function            PT Goals (current goals can now be found in the care plan section) Acute Rehab PT Goals Patient Stated Goal: to get stronger and go home PT Goal Formulation: With patient Time For Goal Achievement: 08/31/20 Potential to Achieve Goals: Fair Progress towards PT goals: Progressing toward goals    Frequency    Min 2X/week      PT Plan Current plan remains appropriate    Co-evaluation PT/OT/SLP Co-Evaluation/Treatment: Yes Reason for Co-Treatment: To address functional/ADL transfers PT goals addressed during session: Mobility/safety with mobility OT goals addressed during session: ADL's and self-care      AM-PAC PT "6 Clicks" Mobility   Outcome Measure  Help needed turning from your back to your side while in a flat bed without using bedrails?: A Little Help needed moving from lying on your back to sitting on the side of a flat bed without using bedrails?: A Little Help needed moving to and from a bed to a chair (including a wheelchair)?: A Lot Help needed standing up from a chair using your arms (e.g., wheelchair or bedside chair)?: A Lot Help needed to walk in hospital room?: Total Help needed climbing 3-5 steps with a railing? :  Total 6 Click Score: 12    End of Session Equipment Utilized During Treatment: Oxygen Activity Tolerance: Patient limited by fatigue Patient left: with call bell/phone within reach;in chair Nurse Communication: Mobility status PT Visit Diagnosis: Difficulty in walking, not elsewhere classified (R26.2)     Time: 1103-1140 PT Time Calculation (min) (ACUTE ONLY): 37 min  Charges:  $Therapeutic Activity: 8-22 mins                     [place] Pager [phone] Office [phone]    Edelmiro Innocent 08/21/2020, 1:03 PM

## 2020-08-21 NOTE — TOC Progression Note (Signed)
Transition of Care [facility]) - Progression Note    Patient Details  Name: [name] MRN: [id number] Date of Birth: [birth year]  Transition of Care [facility]) CM/SW Contact  Leeroy Cha, RN Phone Number: 08/21/2020, 8:24 AM  Clinical Narrative:     70 y.o. female with PMH significant for OSA not on CPAP,NICM,chronic systolic heart failure,HTN, PVCs.   Patient presented to the ED on 07/31/2020 with 2 days of fever, cough, dyspnea.  She was found to be in acute hypoxic respiratory failure, required supplemental oxygen.  CT angio chest showed diffuse bilateral groundglass opacities compatible with diffuse pneumonia. She was admitted for COVID pneumonia and managed for the same.  Despite aggressive treatment of Covid pneumonia, for last several days, patient continues to have significant oxygen requirement and is not ready for discharge. PLAN: home with self care Expected Discharge Plan: [place] Barriers to Discharge: No Barriers Identified  Expected Discharge Plan and Services Expected Discharge Plan: [place] In-house Referral: Clinical Social Work     Living arrangements for the past 2 months: Single Family Home                                       Social Determinants of Health (SDOH) Interventions    Readmission Risk Interventions No flowsheet data found.

## 2020-08-21 NOTE — Progress Notes (Signed)
Occupational Therapy Treatment Patient Details Name: [name] MRN: [id number] DOB: 1950/08/16 Today's Date: 08/21/2020    History of present illness Pt is 70 year old female who has not been vaccinated against COVID-19 with a past medical history of OSA not on CPAP, NICM, chronic systolic heart failure, high PVC burden s/p radiofrequency catheter ablation with [name] on 07/18/2018, hypertension who presented to the ED with 2 days of fever, cough, dyspnea. Pt admitted for  Acute hypoxic respiratory failure and Severe Sepsis without septic shock secondary to COVID-19.   OT comments  Treatment focused on improving activity tolerance. Patient now on 25 L HHFNC and 15 L PNB. Patient complaining of dizziness at rest but agreeable to get out of bed. Patient required sidelying to sit positioning - with resting break between transitioning into sitting in order to tolerate dizziness and o2 sat drop. Patient sat at side of bed for prolonged amount of time to recover after sats dropped to 79% and dizziness. Patient reporting chest pain/pressure and wanting chest percussion - feeling like something is stuck in her chest. Patient provided verbal cues for breathing technique - though patient unable to accurately perform tending to pant through mouth. Patient very fearful of falling and needing to people to feel safe. Patient required min assist x 2 to stand and pivot to recliner. O2 sats dropped to 72% and recovered to 84-86% while therapist in the room. Rn notified of patient's complaints of chest pain. Patient continues to require high levels of oxygenation and her prolonged hospital stay has significantly weakened her. Recommend short term rehab at discharge.   Follow Up Recommendations  LTACH;SNF    Equipment Recommendations  None recommended by OT    Recommendations for Other Services      Precautions / Restrictions Precautions Precautions: Fall Precaution Comments: monitor vitals, currently on  25L HHFNC 85% FIO2 and 15L PNB, Restrictions Weight Bearing Restrictions: No Other Position/Activity Restrictions: pt with left wrist brace, states she is NWB to wrist, pt reports H/O vertigo, takes meclazine routinely       Mobility Bed Mobility Overal bed mobility: Needs Assistance Bed Mobility: Rolling;Sidelying to Sit Rolling: Supervision Sidelying to sit: Min guard       General bed mobility comments: Patient reports increased vertigo symptoms today - dizziness at rest and with movement. Patient able to roll left and right without significant dizziness - though does report some with roll the right. Patient able to place herself in sidelying. Needed to stay in position prior to sitting up to manage dizziness.  Transfers Overall transfer level: Needs assistance Equipment used: 2 person hand held assist Transfers: Sit to/from [facility] Sit to Stand: Min assist;+2 physical assistance;+2 safety/equipment Stand pivot transfers: Min assist;+2 physical assistance;+2 safety/equipment;From elevated surface       General transfer comment: Patient fearful of falling due to not being out of bed for significant amount of time, dizziness, and weakness. Min x 2 to stand and pivot tor recliner. O2 sat dropped to 73%. Recovered to 84-86%. Left in recliner with lift pad underneath her for safety.    Balance Overall balance assessment: Needs assistance Sitting-balance support: Single extremity supported Sitting balance-Leahy Scale: Fair Sitting balance - Comments: min guard to min assist for balance due to dizziness.   Standing balance support: During functional activity Standing balance-Leahy Scale: Poor Standing balance comment: Needs external support.  ADL either performed or assessed with clinical judgement   ADL                                               Vision Patient Visual Report: No change from  baseline     Perception     Praxis      Cognition Arousal/Alertness: Awake/alert Behavior During Therapy: WFL for tasks assessed/performed Overall Cognitive Status: Within Functional Limits for tasks assessed                                          Exercises     Shoulder Instructions       General Comments      Pertinent Vitals/ Pain       Pain Assessment: Faces Faces Pain Scale: Hurts little more Pain Location: chest pressure Pain Descriptors / Indicators: Grimacing;Discomfort Pain Intervention(s): Limited activity within patient's tolerance;Monitored during session  Home Living                                          Prior Functioning/Environment              Frequency  Min 2X/week        Progress Toward Goals  OT Goals(current goals can now be found in the care plan section)  Progress towards OT goals: Not progressing toward goals - comment  Acute Rehab OT Goals Patient Stated Goal: to get stronger and go home OT Goal Formulation: With patient Time For Goal Achievement: 08/24/20 Potential to Achieve Goals: [place] Discharge plan needs to be updated    Co-evaluation    PT/OT/SLP Co-Evaluation/Treatment: Yes Reason for Co-Treatment: To address functional/ADL transfers;For patient/therapist safety PT goals addressed during session: Mobility/safety with mobility OT goals addressed during session:  (activity tolerance)      AM-PAC OT "6 Clicks" Daily Activity     Outcome Measure   Help from another person eating meals?: None Help from another person taking care of personal grooming?: A Little Help from another person toileting, which includes using toliet, bedpan, or urinal?: A Lot Help from another person bathing (including washing, rinsing, drying)?: A Lot Help from another person to put on and taking off regular upper body clothing?: A Lot Help from another person to put on and taking off regular lower  body clothing?: A Lot 6 Click Score: 15    End of Session Equipment Utilized During Treatment: Oxygen  OT Visit Diagnosis: Other abnormalities of gait and mobility (R26.89);Muscle weakness (generalized) (M62.81)   Activity Tolerance Patient limited by fatigue   Patient Left in chair;with call bell/phone within reach   Nurse Communication Mobility status        Time: 2778-2423 OT Time Calculation (min): 31 min  Charges: OT General Charges $OT Visit: 1 Visit OT Treatments $Therapeutic Activity: 8-22 mins  Derl Barrow, OTR/L [place]  Office [phone] Pager: [place] 08/21/2020, 12:44 PM

## 2020-08-21 NOTE — Progress Notes (Signed)
Changed water on [place] system- uneventful.

## 2020-08-22 DIAGNOSIS — J9601 Acute respiratory failure with hypoxia: Secondary | ICD-10-CM | POA: Diagnosis not present

## 2020-08-22 DIAGNOSIS — U071 COVID-19: Secondary | ICD-10-CM | POA: Diagnosis not present

## 2020-08-22 LAB — GLUCOSE, CAPILLARY
Glucose-Capillary: 104 mg/dL — ABNORMAL HIGH (ref 70–99)
Glucose-Capillary: 101 mg/dL — ABNORMAL HIGH (ref 70–99)
Glucose-Capillary: 222 mg/dL — ABNORMAL HIGH (ref 70–99)
Glucose-Capillary: 174 mg/dL — ABNORMAL HIGH (ref 70–99)

## 2020-08-22 MED ORDER — FUROSEMIDE 10 MG/ML IJ SOLN
40.0000 mg | Freq: Every day | INTRAMUSCULAR | Status: DC
  Administered 2020-08-22 – 2020-08-28 (×7): 40 mg via INTRAVENOUS
  Filled 2020-08-22 (×7): qty 4
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
## 2020-08-23 DIAGNOSIS — U071 COVID-19: Secondary | ICD-10-CM | POA: Diagnosis not present

## 2020-08-23 DIAGNOSIS — J9601 Acute respiratory failure with hypoxia: Secondary | ICD-10-CM | POA: Diagnosis not present

## 2020-08-23 LAB — CBC WITH DIFFERENTIAL/PLATELET
Abs Immature Granulocytes: 0.09 10*3/uL — ABNORMAL HIGH (ref 0.00–0.07)
Basophils Absolute: 0 10*3/uL (ref 0.0–0.1)
Basophils Relative: 0 %
Eosinophils Absolute: 0 10*3/uL (ref 0.0–0.5)
Eosinophils Relative: 0 %
HCT: 36.6 % (ref 36.0–46.0)
Hemoglobin: 12.3 g/dL (ref 12.0–15.0)
Immature Granulocytes: 1 %
Lymphocytes Relative: 5 %
Lymphs Abs: 0.4 10*3/uL — ABNORMAL LOW (ref 0.7–4.0)
MCH: 28.9 pg (ref 26.0–34.0)
MCHC: 33.6 g/dL (ref 30.0–36.0)
MCV: 86.1 fL (ref 80.0–100.0)
Monocytes Absolute: 0.4 10*3/uL (ref 0.1–1.0)
Monocytes Relative: 5 %
Neutro Abs: 7.2 10*3/uL (ref 1.7–7.7)
Neutrophils Relative %: 89 %
Platelets: 99 10*3/uL — ABNORMAL LOW (ref 150–400)
RBC: 4.25 MIL/uL (ref 3.87–5.11)
RDW: 15.6 % — ABNORMAL HIGH (ref 11.5–15.5)
WBC: 8.1 10*3/uL (ref 4.0–10.5)
nRBC: 0 % (ref 0.0–0.2)

## 2020-08-23 LAB — BASIC METABOLIC PANEL
Anion gap: 7 (ref 5–15)
BUN: 32 mg/dL — ABNORMAL HIGH (ref 8–23)
CO2: 26 mmol/L (ref 22–32)
Calcium: 8.7 mg/dL — ABNORMAL LOW (ref 8.9–10.3)
Chloride: 104 mmol/L (ref 98–111)
Creatinine, Ser: 0.73 mg/dL (ref 0.44–1.00)
GFR, Estimated: >60 mL/min (ref >60)
Glucose, Bld: 148 mg/dL — ABNORMAL HIGH (ref 70–99)
Potassium: 4 mmol/L (ref 3.5–5.1)
Sodium: 137 mmol/L (ref 135–145)

## 2020-08-23 LAB — GLUCOSE, CAPILLARY
Glucose-Capillary: 102 mg/dL — ABNORMAL HIGH (ref 70–99)
Glucose-Capillary: 118 mg/dL — ABNORMAL HIGH (ref 70–99)
Glucose-Capillary: 155 mg/dL — ABNORMAL HIGH (ref 70–99)
Glucose-Capillary: 154 mg/dL — ABNORMAL HIGH (ref 70–99)

## 2020-08-23 LAB — MAGNESIUM: Magnesium: 2 mg/dL (ref 1.7–2.4)
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
## 2020-08-24 DIAGNOSIS — J9601 Acute respiratory failure with hypoxia: Secondary | ICD-10-CM | POA: Diagnosis not present

## 2020-08-24 DIAGNOSIS — U071 COVID-19: Secondary | ICD-10-CM | POA: Diagnosis not present

## 2020-08-24 LAB — GLUCOSE, CAPILLARY
Glucose-Capillary: 150 mg/dL — ABNORMAL HIGH (ref 70–99)
Glucose-Capillary: 158 mg/dL — ABNORMAL HIGH (ref 70–99)
Glucose-Capillary: 136 mg/dL — ABNORMAL HIGH (ref 70–99)
Glucose-Capillary: 138 mg/dL — ABNORMAL HIGH (ref 70–99)

## 2020-08-24 MED ORDER — METHYLPREDNISOLONE SODIUM SUCC 125 MG IJ SOLR
60.0000 mg | Freq: Every day | INTRAMUSCULAR | Status: DC
  Administered 2020-08-24 – 2020-08-27 (×4): 60 mg via INTRAVENOUS
  Filled 2020-08-24 (×4): qty 2
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
## 2020-08-25 DIAGNOSIS — J9601 Acute respiratory failure with hypoxia: Secondary | ICD-10-CM | POA: Diagnosis not present

## 2020-08-25 DIAGNOSIS — U071 COVID-19: Secondary | ICD-10-CM | POA: Diagnosis not present

## 2020-08-25 LAB — GLUCOSE, CAPILLARY
Glucose-Capillary: 85 mg/dL (ref 70–99)
Glucose-Capillary: 176 mg/dL — ABNORMAL HIGH (ref 70–99)
Glucose-Capillary: 155 mg/dL — ABNORMAL HIGH (ref 70–99)
Glucose-Capillary: 284 mg/dL — ABNORMAL HIGH (ref 70–99)

## 2020-08-25 MED ORDER — MORPHINE SULFATE (PF) 2 MG/ML IV SOLN
2.0000 mg | INTRAVENOUS | Status: DC | PRN
  Filled 2020-08-25: qty 1
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
## 2020-08-25 NOTE — Progress Notes (Signed)
Pt reported sharp chest pain, was intermittent throughout the day but did not notify any of the healthcare team about it. This occurrence, chest pain persisted and thus notified healthcare team. EKG performed, shows no signs of current infarct. Night time floor coverage paged, morphine ordered. Pt reports nausea and vomiting to morphine, refuses morphine and reports chest pain no longer present. Pt instructed to notify healthcare team if chest pain returns. Will continue to monitor.

## 2020-08-26 ENCOUNTER — Ambulatory Visit: Payer: Medicare HMO | Admitting: Cardiovascular Disease

## 2020-08-26 DIAGNOSIS — U071 COVID-19: Secondary | ICD-10-CM | POA: Diagnosis not present

## 2020-08-26 DIAGNOSIS — J9601 Acute respiratory failure with hypoxia: Secondary | ICD-10-CM | POA: Diagnosis not present

## 2020-08-26 LAB — GLUCOSE, CAPILLARY
Glucose-Capillary: 92 mg/dL (ref 70–99)
Glucose-Capillary: 158 mg/dL — ABNORMAL HIGH (ref 70–99)
Glucose-Capillary: 160 mg/dL — ABNORMAL HIGH (ref 70–99)
Glucose-Capillary: 175 mg/dL — ABNORMAL HIGH (ref 70–99)

## 2020-08-26 NOTE — Progress Notes (Signed)
Physical Therapy Treatment Patient Details Name: [name] MRN: [id number] DOB: 1951/05/13 Today's Date: 08/26/2020    History of Present Illness Pt is 70 year old female who has not been vaccinated against COVID-19 with a past medical history of OSA not on CPAP, NICM, chronic systolic heart failure, high PVC burden s/p radiofrequency catheter ablation with [name] on 07/18/2018, hypertension who presented to the ED with 2 days of fever, cough, dyspnea. Pt admitted for  Acute hypoxic respiratory failure and Severe Sepsis without septic shock secondary to COVID-19.    PT Comments    The patient states that her vertigo is  Bad today. States" I wish I could see the wall.". Patient  Restless, requiring much encouragement  For mobility. Patient required 2 max assist to eventually stand and transfer to recliner.   At rest on 35 L/80% at rest, SPO2 93%  During mobility dropping into 60's , Placed on PNRB 15 L, with SPO2 returning to mid 80's.  Patient continues to tolerate minimal activity with desaturation.  Continue PT for mobility.     Follow Up Recommendations  SNF     Equipment Recommendations  None recommended by PT    Recommendations for Other Services       Precautions / Restrictions Precautions Precautions: Fall Precaution Comments: monitor vitals, currently on 35L HHFNC needed NRB 15L for recovery Restrictions Weight Bearing Restrictions: No LUE Weight Bearing: Non weight bearing Other Position/Activity Restrictions: pt with left wrist brace, states she is NWB to wrist, pt reports H/O vertigo, takes meclazine routinely    Mobility  Bed Mobility Overal bed mobility: Needs Assistance Bed Mobility: Supine to Sit     Supine to sit: Min assist;HOB elevated     General bed mobility comments: min A for trunk support and increased time    Transfers Overall transfer level: Needs assistance Equipment used: 2 person hand held assist Transfers: Sit to/from [employer] Sit to Stand: Mod assist;+2 physical assistance;+2 safety/equipment;From elevated surface Stand pivot transfers: Mod assist;+2 physical assistance;+2 safety/equipment       General transfer comment: unable to power up to standing on first attempt, bed height elevated and with mod A x2 pt able to power up to standing and pivot to recliner  Ambulation/Gait                 Stairs             Wheelchair Mobility    Modified Rankin (Stroke Patients Only)       Balance Overall balance assessment: Needs assistance Sitting-balance support: Feet supported Sitting balance-Leahy Scale: Fair Sitting balance - Comments: intermittently patient leans, closes eyes ,reports feeling dizzy.   Standing balance support: Bilateral upper extremity supported Standing balance-Leahy Scale: Poor Standing balance comment: reliant on mod A x2                            Cognition Arousal/Alertness: Awake/alert Behavior During Therapy: Anxious Overall Cognitive Status: No family/caregiver present to determine baseline cognitive functioning                         Following Commands: Follows one step commands consistently;Follows multi-step commands inconsistently       General Comments: patient appearing anxious with mobility "I was ready 30 mins ago and know I don't know what happened, my vertigo" needs increased time seated EOB and encouragement to stand  Exercises      General Comments        Pertinent Vitals/Pain Pain Assessment: Faces Faces Pain Scale: Hurts even more Pain Location: ears Pain Descriptors / Indicators: Pressure Pain Intervention(s): Monitored during session    Home Living                      Prior Function            PT Goals (current goals can now be found in the care plan section) Acute Rehab PT Goals Patient Stated Goal: to get stronger and go home Progress towards PT goals: Progressing toward  goals    Frequency    Min 2X/week      PT Plan Current plan remains appropriate    Co-evaluation PT/OT/SLP Co-Evaluation/Treatment: Yes Reason for Co-Treatment: Complexity of the patient's impairments (multi-system involvement);For patient/therapist safety PT goals addressed during session: Mobility/safety with mobility OT goals addressed during session: ADL's and self-care      AM-PAC PT "6 Clicks" Mobility   Outcome Measure  Help needed turning from your back to your side while in a flat bed without using bedrails?: A Little Help needed moving from lying on your back to sitting on the side of a flat bed without using bedrails?: A Little Help needed moving to and from a bed to a chair (including a wheelchair)?: A Lot Help needed standing up from a chair using your arms (e.g., wheelchair or bedside chair)?: A Lot Help needed to walk in hospital room?: Total Help needed climbing 3-5 steps with a railing? : Total 6 Click Score: 12    End of Session Equipment Utilized During Treatment: Oxygen Activity Tolerance: Treatment limited secondary to medical complications (Comment);Patient limited by pain Patient left: with call bell/phone within reach;in chair Nurse Communication: Mobility status PT Visit Diagnosis: Difficulty in walking, not elsewhere classified (R26.2)     Time: 1610-9604 PT Time Calculation (min) (ACUTE ONLY): 30 min  Charges:  $Therapeutic Activity: 8-22 mins                     Tresa Endo PT Acute Rehabilitation Services Pager [phone] Office [phone]    Claretha Cooper 08/26/2020, 1:19 PM

## 2020-08-26 NOTE — Progress Notes (Signed)
MD Maylene Roes notified and aware for continuation of foley. Foley orders in .

## 2020-08-26 NOTE — Progress Notes (Signed)
Occupational Therapy Treatment Patient Details Name: [name] MRN: [id number] DOB: 1950-09-19 Today's Date: 08/26/2020    History of present illness Pt is 70 year old female who has not been vaccinated against COVID-19 with a past medical history of OSA not on CPAP, NICM, chronic systolic heart failure, high PVC burden s/p radiofrequency catheter ablation with [name] on 07/18/2018, hypertension who presented to the ED with 2 days of fever, cough, dyspnea. Pt admitted for  Acute hypoxic respiratory failure and Severe Sepsis without septic shock secondary to COVID-19.   OT comments  Patient continues to require [place] on 35L this session and increased time for all mobility. Pt reporting her ears hurt from pressure "maybe its the oxygen?" and dizziness from her vertigo. Pt needing min A for bed mobility and prolonged sitting EOB with sats reading in 70s RR in 30s however unsure if accurate wave form as patient would quickly increase to 80s and was carrying on conversation with therapy. Pt needed two attempts to stand, with bed height elevated and mod A x2 on second attempt in order to pivot to recliner. RN arrive at end of session to don NRB on 15L with patient recovering from 80% up to 90%. Continue to recommend acute OT services to maximize endurance and reduce O2 needs in order to facilitate D/C. Per chart pt stating going home with her DTR however unsure if family can provide physical assist pt currently needing for BADLs/transfers.   Follow Up Recommendations  LTACH;SNF    Equipment Recommendations  None recommended by OT       Precautions / Restrictions Precautions Precautions: Fall Precaution Comments: monitor vitals, currently on 35L HHFNC needed NRB 15L for recovery Restrictions Weight Bearing Restrictions: No       Mobility Bed Mobility Overal bed mobility: Needs Assistance Bed Mobility: Supine to Sit     Supine to sit: Min assist;HOB elevated     General bed  mobility comments: min A for trunk support and increased time    Transfers Overall transfer level: Needs assistance Equipment used: 2 person hand held assist Transfers: Sit to/from [facility] Sit to Stand: Mod assist;+2 physical assistance;+2 safety/equipment;From elevated surface Stand pivot transfers: Mod assist;+2 physical assistance;+2 safety/equipment       General transfer comment: unable to power up to standing on first attempt, bed height elevated and with mod A x2 pt able to power up to standing and pivot to recliner    Balance Overall balance assessment: Needs assistance Sitting-balance support: Feet supported Sitting balance-Leahy Scale: Fair     Standing balance support: Bilateral upper extremity supported Standing balance-Leahy Scale: Poor Standing balance comment: reliant on mod A x2                           ADL either performed or assessed with clinical judgement   ADL Overall ADL's : Needs assistance/impaired                         Toilet Transfer: Moderate assistance;+2 for physical assistance;+2 for safety/equipment;Cueing for safety;Stand-pivot;BSC Toilet Transfer Details (indicate cue type and reason): to recliner, first attempt from EOB unable to clear buttock. bed height elevated and with mod A x2 able to power up to standing and take few steps to recliner. pt with decreased balance, activity tolerance and global weakness         Functional mobility during ADLs: Moderate assistance;+2 for physical assistance;+2 for  safety/equipment;Cueing for safety General ADL Comments: seated EOB O2 sats reading in low 70s on 35L HHFNC and RR in 30s however patient still talking and does not appear to have severe dyspnea. Question pleth accuracy as patient will quickly jump from low 70 to 80%. Pt had recovered to 80% post transfer, RN arrive to room and placed NRB on 15L with increase to 90%               Cognition  Arousal/Alertness: Awake/alert Behavior During Therapy: Anxious Overall Cognitive Status: No family/caregiver present to determine baseline cognitive functioning                                 General Comments: patient appearing anxious with mobility "I was ready 30 mins ago and know I don't know what happened, my vertigo" needs increased time seated EOB and encouragement to stand                   Pertinent Vitals/ Pain       Pain Assessment: Faces Faces Pain Scale: Hurts even more Pain Location: ears Pain Descriptors / Indicators: Pressure Pain Intervention(s): Other (comment) (RN aware)         Frequency  Min 2X/week        Progress Toward Goals  OT Goals(current goals can now be found in the care plan section)  Progress towards OT goals: Not progressing toward goals - comment (ongoing high O2 demands/desats with minimal activity)  Acute Rehab OT Goals Patient Stated Goal: to get stronger and go home OT Goal Formulation: With patient Time For Goal Achievement: 09/09/20 Potential to Achieve Goals: Fair ADL Goals Pt Will Perform Lower Body Dressing: with mod assist;sit to/from stand Pt Will Transfer to Toilet: with min assist;stand pivot transfer;bedside commode Pt Will Perform Toileting - Clothing Manipulation and hygiene: with mod assist;sit to/from stand Pt/caregiver will Perform Home Exercise Program: Increased strength;Both right and left upper extremity;With theraband;Independently  Plan Discharge plan remains appropriate    Co-evaluation    PT/OT/SLP Co-Evaluation/Treatment: Yes Reason for Co-Treatment: Complexity of the patient's impairments (multi-system involvement);For patient/therapist safety;To address functional/ADL transfers   OT goals addressed during session: ADL's and self-care      AM-PAC OT "6 Clicks" Daily Activity     Outcome Measure   Help from another person eating meals?: None Help from another person taking care of  personal grooming?: A Little Help from another person toileting, which includes using toliet, bedpan, or urinal?: A Lot Help from another person bathing (including washing, rinsing, drying)?: A Lot Help from another person to put on and taking off regular upper body clothing?: A Lot Help from another person to put on and taking off regular lower body clothing?: A Lot 6 Click Score: 15    End of Session Equipment Utilized During Treatment: Oxygen  OT Visit Diagnosis: Other abnormalities of gait and mobility (R26.89);Muscle weakness (generalized) (M62.81)   Activity Tolerance Patient limited by fatigue   Patient Left in chair;with call bell/phone within reach   Nurse Communication Mobility status        Time: 0960-4540 OT Time Calculation (min): 25 min  Charges: OT General Charges $OT Visit: 1 Visit OT Treatments $Self Care/Home Management : 8-22 mins  Delbert Phenix OT OT pager: [place] 08/26/2020, 11:59 AM

## 2020-08-26 NOTE — Progress Notes (Signed)
MD Maylene Roes states ok to discontinue airborne/contact precautions.
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
## 2020-08-27 DIAGNOSIS — J9601 Acute respiratory failure with hypoxia: Secondary | ICD-10-CM | POA: Diagnosis not present

## 2020-08-27 DIAGNOSIS — U071 COVID-19: Secondary | ICD-10-CM | POA: Diagnosis not present

## 2020-08-27 LAB — CBC
HCT: 43.4 % (ref 36.0–46.0)
Hemoglobin: 14.7 g/dL (ref 12.0–15.0)
MCH: 28.9 pg (ref 26.0–34.0)
MCHC: 33.9 g/dL (ref 30.0–36.0)
MCV: 85.3 fL (ref 80.0–100.0)
Platelets: 111 10*3/uL — ABNORMAL LOW (ref 150–400)
RBC: 5.09 MIL/uL (ref 3.87–5.11)
RDW: 15.8 % — ABNORMAL HIGH (ref 11.5–15.5)
WBC: 8.6 10*3/uL (ref 4.0–10.5)
nRBC: 0 % (ref 0.0–0.2)

## 2020-08-27 LAB — GLUCOSE, CAPILLARY
Glucose-Capillary: 116 mg/dL — ABNORMAL HIGH (ref 70–99)
Glucose-Capillary: 137 mg/dL — ABNORMAL HIGH (ref 70–99)
Glucose-Capillary: 184 mg/dL — ABNORMAL HIGH (ref 70–99)
Glucose-Capillary: 140 mg/dL — ABNORMAL HIGH (ref 70–99)

## 2020-08-27 MED ORDER — METHYLPREDNISOLONE SODIUM SUCC 40 MG IJ SOLR
40.0000 mg | Freq: Every day | INTRAMUSCULAR | Status: DC
  Administered 2020-08-28: 40 mg via INTRAVENOUS
  Filled 2020-08-27: qty 1

## 2020-08-27 NOTE — Progress Notes (Signed)
Physical Therapy Treatment Patient Details Name: [name] MRN: [id number] DOB: September 05, 1950 Today's Date: 08/27/2020    History of Present Illness Pt is 70 year old female who has not been vaccinated against COVID-19 with a past medical history of OSA not on CPAP, NICM, chronic systolic heart failure, high PVC burden s/p radiofrequency catheter ablation with [name] on 07/18/2018, hypertension who presented to the ED with 2 days of fever, cough, dyspnea. Pt admitted for  Acute hypoxic respiratory failure and Severe Sepsis without septic shock secondary to COVID-19.    PT Comments    Patient resting in bed on 30L/100% HHFNC. SPO2 96%, RR 25, then drops into high 80's without moving sensor on  finger.  Nelcor forehead sensor placed, attempted comparison with  Spo2 which reads about the same as finger sensor. Left sensor on forehead for activity.  Patient eager to  [facility]. Patient has had Meclazine, intermittent complaints of dizziness but not as frequently as previous visit.  Patient had urgency to have a BM. Used STEDY lift to stand and roll to [facility]. SPO2 dropping into hi 70's, increased dyspnea, HR up into 120's. Placed PNRB at 15 L. Patient encouraged relaxation techniques. SPO2 rising into 80's , RR in 30's. Patient sat on [facility] for ~ 10 minutes. RN in to assist with hygiene post toileting. RN increased to 35L. Pt.Assisted to stand again with mod assist to rise from lower seat. Patient returned to bed via [facility]. Patient did appear to recover more quickly once returned to bed. Decreased back to 30L by RN. Patient resting in bed on PNRB and HHFNC.  Continue progressive mobility, monitor VSS, wean as able.  Follow Up Recommendations  SNF     Equipment Recommendations  None recommended by PT    Recommendations for Other Services       Precautions / Restrictions Precautions Precautions: Fall Precaution Comments: monitor vitals, currently on 30L HHFNC , not on NRB but placed while  mobilizing as sats dropped into 70's Restrictions Other Position/Activity Restrictions: pt with left wrist brace, states she is NWB to wrist, pt reports H/O vertigo, takes meclazine routinely    Mobility  Bed Mobility   Bed Mobility: Sidelying to Sit   Sidelying to sit: Min assist   Sit to supine: Min assist   General bed mobility comments: min A for trunk support and increased time, assist legs onto bed as patient fatigued and increased SOB/anxious after being on /BSC.    Transfers Overall transfer level: Needs assistance   Transfers: Sit to/from Stand Sit to Stand: Mod assist;+2 safety/equipment;From elevated surface         General transfer comment: min assist from bed , pulling on Stedy bar. Mod assist from lower BSC. Flaps placed for pt. to sit  to turn to [facility] and back to bed.  Ambulation/Gait                 Stairs             Wheelchair Mobility    Modified Rankin (Stroke Patients Only)       Balance Overall balance assessment: Needs assistance Sitting-balance support: Feet supported Sitting balance-Leahy Scale: Fair Sitting balance - Comments: sat with UE support throughout seated on BSC   Standing balance support: Bilateral upper extremity supported Standing balance-Leahy Scale: Poor Standing balance comment: utilzed stedy  Cognition Arousal/Alertness: Awake/alert Behavior During Therapy: Anxious                                   General Comments: patient  very motivated to work on mobility, cues for slow breaths, moving slowly to avoid dizziness.      Exercises      General Comments        Pertinent Vitals/Pain Faces Pain Scale: Hurts even more Pain Location: abdomen, feels constipated Pain Descriptors / Indicators: Pressure Pain Intervention(s): Monitored during session;Premedicated before session    Home Living                      Prior Function             PT Goals (current goals can now be found in the care plan section) Progress towards PT goals: Progressing toward goals    Frequency    Min 2X/week      PT Plan Current plan remains appropriate    Co-evaluation              AM-PAC PT "6 Clicks" Mobility   Outcome Measure  Help needed turning from your back to your side while in a flat bed without using bedrails?: A Little Help needed moving from lying on your back to sitting on the side of a flat bed without using bedrails?: A Little Help needed moving to and from a bed to a chair (including a wheelchair)?: A Lot Help needed standing up from a chair using your arms (e.g., wheelchair or bedside chair)?: A Lot Help needed to walk in hospital room?: Total Help needed climbing 3-5 steps with a railing? : Total 6 Click Score: 12    End of Session Equipment Utilized During Treatment: Oxygen Activity Tolerance: Patient tolerated treatment well;Patient limited by fatigue Patient left: in bed;with call bell/phone within reach;with nursing/sitter in room Nurse Communication: Mobility status;Need for lift equipment PT Visit Diagnosis: Difficulty in walking, not elsewhere classified (R26.2)     Time: 9038-3338 PT Time Calculation (min) (ACUTE ONLY): 58 min  Charges:  $Therapeutic Activity: 23-37 mins $Self Care/Home Management: [place] Pager [phone] Office [phone] }   Claretha Cooper 08/27/2020, 3:19 PM
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
## 2020-08-27 NOTE — TOC Progression Note (Signed)
Transition of Care [facility]) - Progression Note    Patient Details  Name: [name] MRN: [id number] Date of Birth: [birth year]  Transition [facility]) CM/SW Contact  Leeroy Cha, RN Phone Number: 08/27/2020, 11:17 AM  Clinical Narrative:    Spoke wit patient has no preference as to which nursing home.  Just wants to stay in [place]. [place] number:[phone] A EXM:[phone] fl2 completed and sent out.   Expected Discharge Plan: [place] Barriers to Discharge: No Barriers Identified  Expected Discharge Plan and Services Expected Discharge Plan: [place] In-house Referral: Clinical Social Work     Living arrangements for the past 2 months: Single Family Home                                       Social Determinants of Health (SDOH) Interventions    Readmission Risk Interventions No flowsheet data found.
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
## 2020-08-28 DIAGNOSIS — J9601 Acute respiratory failure with hypoxia: Secondary | ICD-10-CM | POA: Diagnosis not present

## 2020-08-28 DIAGNOSIS — U071 COVID-19: Secondary | ICD-10-CM | POA: Diagnosis not present

## 2020-08-28 LAB — GLUCOSE, CAPILLARY
Glucose-Capillary: 101 mg/dL — ABNORMAL HIGH (ref 70–99)
Glucose-Capillary: 173 mg/dL — ABNORMAL HIGH (ref 70–99)
Glucose-Capillary: 190 mg/dL — ABNORMAL HIGH (ref 70–99)
Glucose-Capillary: 151 mg/dL — ABNORMAL HIGH (ref 70–99)

## 2020-08-28 MED ORDER — PREDNISONE 20 MG PO TABS
40.0000 mg | ORAL_TABLET | Freq: Every day | ORAL | Status: DC
  Administered 2020-08-29 – 2020-09-08 (×11): 40 mg via ORAL
  Filled 2020-08-28 (×11): qty 2
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
## 2020-08-28 NOTE — Progress Notes (Signed)
Physical Therapy Treatment Patient Details Name: [name] MRN: [id number] DOB: 23-Feb-1951 Today's Date: 08/28/2020    History of Present Illness Pt is 70 year old female who has not been vaccinated against COVID-19 with a past medical history of OSA not on CPAP, NICM, chronic systolic heart failure, high PVC burden s/p radiofrequency catheter ablation with [name] on 07/18/2018, hypertension who presented to the ED with 2 days of fever, cough, dyspnea. Pt admitted for  Acute hypoxic respiratory failure and Severe Sepsis without septic shock secondary to COVID-19.    PT Comments    Patient reports having a "bad" day but willing to work on mobility. Patient resting in bed on 35 L/1005 HHFNC, SPO2 96%, then drops to 86%( finger sensor).  Patient's daughter present and encouraging patient. Placed Nelcor forehead sensor with SPO2 92%.  Encouraged patient to make slow transitions while mobilizing due to H/O vertigo(Patient had Meclazine prior to visit) Patient  Does appear to move more readily with less compaints of dizziness, able to turn to look at the 3 different therapist standing before her..   Patient sat on bed edge for ~ 10 minutes. SPO2 at ~ 85% throughout with gradual drop to high 70's. RN into room. PNRB placed with SPo2 back to ~ 82%. Patient stood at [facility] x 1 for ~ 2 minutes, SPO2 ` 80%. RR fluctuates from 25-34. Patient assisted back into bed. SPO2 returned to 90%, removed NRB and placed in the Bed/chair position .  SPO2 dropping back to high 70's, placed PNRB  With SPO2 return to 90%. Left  On  HHFNC without PNRB.[name] Daughter  present Encouraged  Patient  To sit with HOB elevated when tolerated. Readings from Nelcor Forehead and finger appear to be fairly consistent.  Follow Up Recommendations  SNF     Equipment Recommendations  None recommended by PT    Recommendations for Other Services       Precautions / Restrictions Precautions Precaution Comments: monitor vitals,  currently on 35 L / 100% HHFNC , not on NRB at rest but placed while mobilizing as sats dropped into 70's Restrictions Other Position/Activity Restrictions: pt with left wrist brace, states she is NWB to wrist, pt reports H/O vertigo, takes meclazine routinely    Mobility  Bed Mobility Overal bed mobility: Needs Assistance Bed Mobility: Rolling;Sidelying to Sit Rolling: Supervision Sidelying to sit: Min assist     Sit to sidelying: Min assist General bed mobility comments: encouraged slow turns to left side, assisted trunk to sitting upright with min assist.  Increased dyspnea after standing so assisted with legs back onto bed.    Transfers Overall transfer level: Needs assistance   Transfers: Sit to/from Stand Sit to Stand: +2 physical assistance;From elevated surface;Min assist         General transfer comment: min assist from bed , pulling on Stedy bar. Patient stood ~  2 minutes.  Cues to reach back to bed, decreased control of descent.Patient on [place] and PNRB .  Ambulation/Gait                 Stairs             Wheelchair Mobility    Modified Rankin (Stroke Patients Only)       Balance Overall balance assessment: Needs assistance Sitting-balance support: Feet supported Sitting balance-Leahy Scale: Fair Sitting balance - Comments: sat with 1UE support throughout   Standing balance support: Bilateral upper extremity supported Standing balance-Leahy Scale: Poor Standing balance comment: utilzed stedy,  leans on bar, did stand erect  for short time, holding bar.                            Cognition Arousal/Alertness: Awake/alert Behavior During Therapy: Anxious Overall Cognitive Status: Within Functional Limits for tasks assessed                                 General Comments: Patient initially not anxious, visiting with her daughter. After  sitting on bed edge, pt.  stated" I am going to have a panic attack".Verbal  cues  for relaxation techniques, pursed lip breaths. Patient gradually calmed. Once she stood up x 1, similarly anxious. Recovered well once returned to supine. Patient requiring PNRB when increased anxiousness.      Exercises      General Comments        Pertinent Vitals/Pain Faces Pain Scale: Hurts a little bit Pain Location: left wrist, chest , nasal passages Pain Descriptors / Indicators: Pressure;Discomfort Pain Intervention(s): Monitored during session;Premedicated before session;Relaxation;Repositioned    Home Living                      Prior Function            PT Goals (current goals can now be found in the care plan section) Acute Rehab PT Goals Patient Stated Goal: to be abkle to walk PT Goal Formulation: With patient/family Time For Goal Achievement: 09/11/20 Potential to Achieve Goals: Fair Progress towards PT goals: Progressing toward goals    Frequency    Min 2X/week (can bene from more)      PT Plan Current plan remains appropriate    Co-evaluation              AM-PAC PT "6 Clicks" Mobility   Outcome Measure  Help needed turning from your back to your side while in a flat bed without using bedrails?: A Little Help needed moving from lying on your back to sitting on the side of a flat bed without using bedrails?: A Little Help needed moving to and from a bed to a chair (including a wheelchair)?: A Lot Help needed standing up from a chair using your arms (e.g., wheelchair or bedside chair)?: A Lot Help needed to walk in hospital room?: Total Help needed climbing 3-5 steps with a railing? : Total 6 Click Score: 12    End of Session Equipment Utilized During Treatment: Oxygen Activity Tolerance: Patient tolerated treatment well Patient left: in bed;with call bell/phone within reach;with family/visitor present (in bed/chair position.) Nurse Communication: Mobility status;Need for lift equipment PT Visit Diagnosis: Difficulty in  walking, not elsewhere classified (R26.2)     Time: 1530-1620 PT Time Calculation (min) (ACUTE ONLY): 50 min  Charges:  $Therapeutic Activity: 38-52 mins                     Tresa Endo PT Acute Rehabilitation Services Pager [phone] Office [phone]    Claretha Cooper 08/28/2020, 4:56 PM

## 2020-08-28 NOTE — Progress Notes (Signed)
Bladder scan showed 847 mLs of urine in bladder. Dr Maylene Roes notified by this RN. Orders received to follow protocol and in-and-out cath. 1550 mLs of clear, yellow urine drained from patient's bladder. Pt is now comfortable at this time. This RN will continue to carefully monitor pt.

## 2020-08-29 DIAGNOSIS — J9601 Acute respiratory failure with hypoxia: Secondary | ICD-10-CM | POA: Diagnosis not present

## 2020-08-29 DIAGNOSIS — U071 COVID-19: Secondary | ICD-10-CM | POA: Diagnosis not present

## 2020-08-29 LAB — GLUCOSE, CAPILLARY
Glucose-Capillary: 104 mg/dL — ABNORMAL HIGH (ref 70–99)
Glucose-Capillary: 171 mg/dL — ABNORMAL HIGH (ref 70–99)
Glucose-Capillary: 154 mg/dL — ABNORMAL HIGH (ref 70–99)
Glucose-Capillary: 196 mg/dL — ABNORMAL HIGH (ref 70–99)
Glucose-Capillary: 203 mg/dL — ABNORMAL HIGH (ref 70–99)
Glucose-Capillary: 189 mg/dL — ABNORMAL HIGH (ref 70–99)

## 2020-08-29 MED ORDER — FUROSEMIDE 40 MG PO TABS
40.0000 mg | ORAL_TABLET | Freq: Every day | ORAL | Status: DC
  Administered 2020-08-29 – 2020-09-08 (×11): 40 mg via ORAL
  Filled 2020-08-29 (×11): qty 1
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
## 2020-08-30 ENCOUNTER — Inpatient Hospital Stay (HOSPITAL_COMMUNITY): Payer: Medicare HMO

## 2020-08-30 DIAGNOSIS — U071 COVID-19: Secondary | ICD-10-CM | POA: Diagnosis not present

## 2020-08-30 DIAGNOSIS — J9601 Acute respiratory failure with hypoxia: Secondary | ICD-10-CM | POA: Diagnosis not present

## 2020-08-30 LAB — CBC
HCT: 41.8 % (ref 36.0–46.0)
Hemoglobin: 14.1 g/dL (ref 12.0–15.0)
MCH: 28.8 pg (ref 26.0–34.0)
MCHC: 33.7 g/dL (ref 30.0–36.0)
MCV: 85.3 fL (ref 80.0–100.0)
Platelets: 110 10*3/uL — ABNORMAL LOW (ref 150–400)
RBC: 4.9 MIL/uL (ref 3.87–5.11)
RDW: 16 % — ABNORMAL HIGH (ref 11.5–15.5)
WBC: 7.2 10*3/uL (ref 4.0–10.5)
nRBC: 0.3 % — ABNORMAL HIGH (ref 0.0–0.2)

## 2020-08-30 LAB — BASIC METABOLIC PANEL
Anion gap: 12 (ref 5–15)
BUN: 28 mg/dL — ABNORMAL HIGH (ref 8–23)
CO2: 27 mmol/L (ref 22–32)
Calcium: 9 mg/dL (ref 8.9–10.3)
Chloride: 96 mmol/L — ABNORMAL LOW (ref 98–111)
Creatinine, Ser: 0.71 mg/dL (ref 0.44–1.00)
GFR, Estimated: >60 mL/min (ref >60)
Glucose, Bld: 184 mg/dL — ABNORMAL HIGH (ref 70–99)
Potassium: 3.5 mmol/L (ref 3.5–5.1)
Sodium: 135 mmol/L (ref 135–145)

## 2020-08-30 LAB — GLUCOSE, CAPILLARY
Glucose-Capillary: 94 mg/dL (ref 70–99)
Glucose-Capillary: 182 mg/dL — ABNORMAL HIGH (ref 70–99)
Glucose-Capillary: 166 mg/dL — ABNORMAL HIGH (ref 70–99)
Glucose-Capillary: 125 mg/dL — ABNORMAL HIGH (ref 70–99)

## 2020-08-30 MED ORDER — POTASSIUM CHLORIDE CRYS ER 20 MEQ PO TBCR
40.0000 meq | EXTENDED_RELEASE_TABLET | Freq: Once | ORAL | Status: AC
  Administered 2020-08-30: 40 meq via ORAL
  Filled 2020-08-30: qty 2
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
## 2020-08-30 NOTE — Progress Notes (Signed)
Physical Therapy Treatment Patient Details Name: [name] MRN: [id number] DOB: 1951-03-11 Today's Date: 08/30/2020   History of Present Illness  Pt is 70 year old female who has not been vaccinated against COVID-19 with a past medical history of OSA not on CPAP, NICM, chronic systolic heart failure, high PVC burden s/p radiofrequency catheter ablation with [name] on 07/18/2018, hypertension who presented to the ED with 2 days of fever, cough, dyspnea. Pt admitted for  Acute hypoxic respiratory failure and Severe Sepsis without septic shock secondary to COVID-19.    Clinical Impression  Patient with improved sat levels today 85-95% now decreased to 25L 100% on HHFNC. Patient with significant change in Lt LE strength and reporting great difficulty moving Lt leg to reposition in bed. Pt Lt LE MMT revealed  2-/5 for hip flexion and extension, 3-/5 for dorsi felxion, 2+/5 for plantar flexion compared to Rt LE testing of 3+/5 hip flexion,4-/4 for hip extension, 4/5 for ankle dorsi/plantar flexion. Patient also noted to have impaired heel to shin testing on Lt LE and impaired light touch on lower leg and dorsal foot. RN notified and patient reposition to chair position in bed. Educated on exercises for Rt/Lt LE. Acute PT will continue to progress pt as able.     Follow Up Recommendations SNF    Equipment Recommendations  None recommended by PT    Recommendations for Other Services       Precautions / Restrictions Precautions Precautions: Fall Precaution Comments: monitor vitals, currently on 25 L/100% HHFNC , not on NRB at rest but placed while mobilizing as sats dropped into 70's Restrictions Weight Bearing Restrictions: Yes LUE Weight Bearing: Weight bearing as tolerated Other Position/Activity Restrictions: pt with left wrist brace, states she is NWB to wrist, pt reports H/O vertigo, takes meclazine routinely      Mobility  Bed Mobility Overal bed mobility: Needs Assistance              General bed mobility comments: 2+ to boost superiorly in bed and pt using Rt LE and UE's on bed rails to assist with scoot. Bed placed in chair position with HOB at 45 degrees. pt reported mild dizziness that subsided after ~1 minute. Sats dropped to 85% then improved back to 98% sitting upright in bed.              Pertinent Vitals/Pain Pain Assessment: No/denies pain Faces Pain Scale: No hurt Pain Intervention(s): Limited activity within patient's tolerance;Monitored during session     Extremity/Trunk Assessment        Lower Extremity Assessment Lower Extremity Assessment: RLE deficits/detail;LLE deficits/detail RLE Deficits / Details: 3+/5 hip flexion,4-/4 for hip extension, 4/5 for ankle dorsi/plantar flexion. normal heel to shin. 2 beats clonus on Rt LE. RLE Sensation: WNL RLE Coordination: WNL LLE Deficits / Details: 2-/5 for hip flexion and extension, 3-/5 for dorsi felxion, 2+/5 for plantar flexion. pt with impaired coordination and weakness limiting heel to shin. LLE Sensation: decreased light touch (pt reports light touch intact except for dorsal foot and lateral lower leg.) LLE Coordination: decreased gross motor    Cervical / Trunk Assessment Cervical / Trunk Assessment: Normal  Communication      Cognition Arousal/Alertness: Awake/alert Behavior During Therapy: Anxious;WFL for tasks assessed/performed Overall Cognitive Status: Within Functional Limits for tasks assessed                General Comments: pt reporting some concerns over new Lt LE weakness. reports started yesterday.  General Comments      Exercises Other Exercises Other Exercises: Reviewed Rt LE heelslides and SAQ while in chair position. Educated pt to use gait belt for Lt LE heel slide and to complete SAQ however pt unable to fully lift Lt foot off bed.   Assessment/Plan    PT Assessment    PT Problem List         PT Treatment Interventions      PT Goals  (Current goals can be found in the Care Plan section)  Acute Rehab PT Goals Patient Stated Goal: to get better and go home PT Goal Formulation: With patient/family Time For Goal Achievement: 09/11/20 Potential to Achieve Goals: Fair    Frequency Min 2X/week   Barriers to discharge        Co-evaluation   Reason for Co-Treatment: For patient/therapist safety;To address functional/ADL transfers   OT goals addressed during session: Strengthening/ROM       AM-PAC PT "6 Clicks" Mobility  Outcome Measure Help needed turning from your back to your side while in a flat bed without using bedrails?: A Little Help needed moving from lying on your back to sitting on the side of a flat bed without using bedrails?: A Little Help needed moving to and from a bed to a chair (including a wheelchair)?: A Lot Help needed standing up from a chair using your arms (e.g., wheelchair or bedside chair)?: A Lot Help needed to walk in hospital room?: Total Help needed climbing 3-5 steps with a railing? : Total 6 Click Score: 12    End of Session Equipment Utilized During Treatment: Oxygen Activity Tolerance: Patient tolerated treatment well Patient left: in bed;with call bell/phone within reach;with bed alarm set (chair position) Nurse Communication: Mobility status;Need for lift equipment PT Visit Diagnosis: Difficulty in walking, not elsewhere classified (R26.2)    Time: 3976-7341 PT Time Calculation (min) (ACUTE ONLY): 19 min   Charges:     PT Treatments $Therapeutic Activity: 8-22 mins        Verner Mould, DPT Acute Rehabilitation Services Office [phone] Pager [phone]    Jacques Navy 08/30/2020, 3:10 PM

## 2020-08-30 NOTE — Progress Notes (Signed)
  PROGRESS NOTE  Patient with complaints of left leg weakness and tingling while working with PT. Has had some intermittent pain in the leg as well. No other focal deficits reported. Unfortunately cannot transport to CT/MRI while on 25L HHFNC. Patient remains on Eliquis, so unlikely to change clinical course. I will check repeat DVT duplex US as it is in the same leg as hx of DVT in January. Monitor.    Dessa Phi, DO [facility] 08/30/2020, 4:50 PM  Available via Epic secure chat 7am-7pm After these hours, please refer to coverage provider listed on [email or web address]

## 2020-08-30 NOTE — Progress Notes (Signed)
Occupational Therapy Treatment Patient Details [name]: Chelsea [name] MRN: [id number] DOB: November 01, 1950 Today's Date: 08/30/2020    History of present illness Pt is 70 year old female who has not been vaccinated against COVID-19 with a past medical history of OSA not on CPAP, NICM, chronic systolic heart failure, high PVC burden s/p radiofrequency catheter ablation with [name] on 07/18/2018, hypertension who presented to the ED with 2 days of fever, cough, dyspnea. Pt admitted for  Acute hypoxic respiratory failure and Severe Sepsis without septic shock secondary to COVID-19.   OT comments  Patient reporting new onset of LLE weakness today and some numbness on her foot. No complaints of weakness in upper extremity or changes in vision. UE extremities assessed - left shoulder and elbow weaker on left compared to right but not significantly. Patient has recent history of wrist fracture so decreased use could be contributing to difference in strength. However LE significantly weak compared to right - with inability to lift leg. She has not had difficulty with LLE during prior treatments. Further mobility deferred at this time. Rn notified who reports she will inform MD.   Follow Up Recommendations  LTACH;SNF    Equipment Recommendations       Recommendations for Other Services      Precautions / Restrictions Precautions Precautions: Fall Precaution Comments: monitor vitals, currently on 25 L/100% HHFNC , not on NRB at rest but placed while mobilizing as sats dropped into 70's Restrictions Weight Bearing Restrictions: Yes LUE Weight Bearing: Weight bearing as tolerated Other Position/Activity Restrictions: pt with left wrist brace, states she is NWB to wrist, pt reports H/O vertigo, takes meclazine routinely       Mobility Bed Mobility Overal bed mobility: Needs Assistance             General bed mobility comments: 2+ to boost superiorly in bed and pt using Rt LE and UE's on bed  rails to assist with scoot. Bed placed in chair position with HOB at 45 degrees. pt reported mild dizziness that subsided after ~1 minute. Sats dropped to 85% then improved back to 98% sitting upright in bed.    Transfers                      Balance                                           ADL either performed or assessed with clinical judgement   ADL                                               Vision Patient Visual Report: No change from baseline     Perception     Praxis      Cognition Arousal/Alertness: Awake/alert Behavior During Therapy: Anxious;WFL for tasks assessed/performed Overall Cognitive Status: Within Functional Limits for tasks assessed                                 General Comments: pt reporting some concerns over new Lt LE weakness. reports started yesterday.        Exercises Exercises: Other exercises Other Exercises Other Exercises: Reviewed Rt LE heelslides and SAQ while  in chair position. Educated pt to use gait belt for Lt LE heel slide and to complete SAQ however pt unable to fully lift Lt foot off bed.   Shoulder Instructions       General Comments      Pertinent Vitals/ Pain       Pain Assessment: No/denies pain Faces Pain Scale: No hurt Pain Intervention(s): Limited activity within patient's tolerance;Monitored during session  Home Living                                          Prior Functioning/Environment              Frequency  Min 2X/week        Progress Toward Goals  OT Goals(current goals can now be found in the care plan section)  Progress towards OT goals: OT to reassess next treatment  Acute Rehab OT Goals Patient Stated Goal: to get better and go home OT Goal Formulation: With patient Time For Goal Achievement: 09/09/20 Potential to Achieve Goals: [name]    Co-evaluation     PT/OT/SLP Co-Evaluation/Treatment: Yes Reason for Co-Treatment: For patient/therapist safety;To address functional/ADL transfers   OT goals addressed during session: Strengthening/ROM      AM-PAC OT "6 Clicks" Daily Activity     Outcome Measure   Help from another person eating meals?: None Help from another person taking care of personal grooming?: A Little Help from another person toileting, which includes using toliet, bedpan, or urinal?: A Lot Help from another person bathing (including washing, rinsing, drying)?: A Lot Help from another person to put on and taking off regular upper body clothing?: A Lot Help from another person to put on and taking off regular lower body clothing?: A Lot 6 Click Score: 15    End of Session Equipment Utilized During Treatment: Oxygen  OT Visit Diagnosis: Other abnormalities of gait and mobility (R26.89);Muscle weakness (generalized) (M62.81)   Activity Tolerance Patient tolerated treatment well   Patient Left in chair;with call bell/phone within reach   Nurse Communication  (new LE weakness)        Time: 0923-3007 OT Time Calculation (min): 15 min  Charges:  (no charge this visit)  Derl Barrow, OTR/L [place]  Office [phone] Pager: [place] 08/30/2020, 3:07 PM

## 2020-08-31 ENCOUNTER — Inpatient Hospital Stay (HOSPITAL_COMMUNITY): Payer: Medicare HMO

## 2020-08-31 DIAGNOSIS — U071 COVID-19: Secondary | ICD-10-CM | POA: Diagnosis not present

## 2020-08-31 DIAGNOSIS — Z515 Encounter for palliative care: Secondary | ICD-10-CM | POA: Diagnosis not present

## 2020-08-31 DIAGNOSIS — J9601 Acute respiratory failure with hypoxia: Secondary | ICD-10-CM | POA: Diagnosis not present

## 2020-08-31 DIAGNOSIS — Z7189 Other specified counseling: Secondary | ICD-10-CM | POA: Diagnosis not present

## 2020-08-31 DIAGNOSIS — M79605 Pain in left leg: Secondary | ICD-10-CM | POA: Diagnosis not present

## 2020-08-31 DIAGNOSIS — A419 Sepsis, unspecified organism: Secondary | ICD-10-CM | POA: Diagnosis not present

## 2020-08-31 LAB — GLUCOSE, CAPILLARY
Glucose-Capillary: 83 mg/dL (ref 70–99)
Glucose-Capillary: 142 mg/dL — ABNORMAL HIGH (ref 70–99)
Glucose-Capillary: 166 mg/dL — ABNORMAL HIGH (ref 70–99)
Glucose-Capillary: 164 mg/dL — ABNORMAL HIGH (ref 70–99)

## 2020-08-31 NOTE — Progress Notes (Signed)
Left lower extremity venous duplex has been completed. Preliminary results can be found in CV Proc through chart review.   08/31/20 9:29 AM [name] RVT

## 2020-08-31 NOTE — Progress Notes (Signed)
Noted pt's documented O2 sats trends coincide with pts family being at bedside. Pt expressed concern regarding issuers with 70 y/o unemployed son and how she was going to take care of him while she was being hospitalized.Pt was very talkative while family at bedside. Pt denies SOB or distress at this time.

## 2020-08-31 NOTE — Progress Notes (Signed)
Physical Therapy Treatment Patient Details Name: [name] MRN: [id number] DOB: [birth year] Today's Date: 08/31/2020    History of Present Illness Pt is 70 year old female who has not been vaccinated against COVID-19 with a past medical history of OSA not on CPAP, NICM, chronic systolic heart failure, high PVC burden s/p radiofrequency catheter ablation with [name] on 07/18/2018, hypertension who presented to the ED with 2 days of fever, cough, dyspnea. Pt admitted for  Acute hypoxic respiratory failure and Severe Sepsis without septic shock secondary to COVID-19.    PT Comments    Patient on 45L/min HHFNC 100% today (increased from 25L last session); required 15L/min NRB during activity. Pt remains motivated to progress toward acute PT mobility goals. Pt continues to be fatigued and desats to low 80's (79% lowest) throughout session requiring extended rest breaks and cues for pursed lip breathing to promote oxygen recovery. Pt performed supine to sit transfer with supervision and cues for safe hand placement. Pt was able to stand with +2 MOD assist for ~30s with HR reaching 130bpm. EOS pt increased to 50L HHFNC for recovery to 90%, reduced back to 45L and maintained sats at 88-90% with NRB. Pt required MIN assist for sit to supine transfer 2/2 fatigue at EOS and placed in chair position for increased upright activity and to eat lunch. Acute therapy to follow up during stay to maximize functional mobility.   Follow Up Recommendations  SNF     Equipment Recommendations  None recommended by PT    Recommendations for Other Services       Precautions / Restrictions Precautions Precautions: Fall Restrictions Weight Bearing Restrictions: Yes LUE Weight Bearing: Weight bearing as tolerated Other Position/Activity Restrictions: pt with left wrist brace, states she is NWB to wrist, pt reports H/O vertigo, takes meclazine routinely    Mobility  Bed Mobility Overal bed mobility: Needs  Assistance Bed Mobility: Supine to Sit     Supine to sit: Supervision;HOB elevated Sit to supine: Min assist;HOB elevated   General bed mobility comments: supervision for safety and cues for hand placement and use of R UE on bedrail to perform supine to sit with increased time.  MIN assist for prorgession of LEs onto bed 2/2 fatigue with sit to supine. pt able to assist with +2 scooting toward HOB in supine.    Transfers Overall transfer level: Needs assistance Equipment used: 2 person hand held assist Transfers: Sit to/from Stand Sit to Stand: +2 physical assistance;+2 safety/equipment;Mod assist         General transfer comment: Pt able to stand for ~30s with MOD assist +2 for safety and power up to stand from EOB. Pt's HR reached 130bpm in standing and sats 81-85% in standing.  Ambulation/Gait                 Stairs             Wheelchair Mobility    Modified Rankin (Stroke Patients Only)       Balance Overall balance assessment: Needs assistance Sitting-balance support: Feet supported Sitting balance-Leahy Scale: Fair Sitting balance - Comments: pt able to sit with single-No UE support throughout session.   Standing balance support: During functional activity;Bilateral upper extremity supported Standing balance-Leahy Scale: Poor Standing balance comment: use of +2 assist to maintain standing.                            Cognition Arousal/Alertness: Awake/alert Behavior During Therapy:  WFL for tasks assessed/performed Overall Cognitive Status: Within Functional Limits for tasks assessed                                        Exercises General Exercises - Lower Extremity Long Arc Quad: AROM;Both;10 reps;Seated Other Exercises Other Exercises: reviewed heel slide and SAQ in bed for Bil LEs. Other Exercises: Pt's Lt LE with good return of strength. able to complete SLR, 4/5 for dorsi/plantar flexion (equal to Rt LE)     General Comments General comments (skin integrity, edema, etc.): Pt on 45L/min HHFNC (100%). At start of session Sat 96%, desating to high 80's while talking. Pt desats to 80% sitting EOB and NRB donned at 15L/min. Sats improved to 84-87%. pt desat to 79%, improved with pursed lip breathing. Sats 83-85% in standing. EOS pt placed on 50L/min and sat returned to 90%, pt reduced back to 45L HHFNC with NRB and sat stayed at 88-90%.      Pertinent Vitals/Pain Pain Assessment: Faces Faces Pain Scale: Hurts little more Pain Location: L wrist, chest Pain Descriptors / Indicators: Tightness;Tender;Discomfort Pain Intervention(s): Limited activity within patient's tolerance;Monitored during session;Repositioned    Home Living                      Prior Function            PT Goals (current goals can now be found in the care plan section) Acute Rehab PT Goals Patient Stated Goal: to get better and go home PT Goal Formulation: With patient Time For Goal Achievement: 09/11/20 Potential to Achieve Goals: Fair Progress towards PT goals: Progressing toward goals    Frequency    Min 2X/week      PT Plan Current plan remains appropriate    Co-evaluation              AM-PAC PT "6 Clicks" Mobility   Outcome Measure  Help needed turning from your back to your side while in a flat bed without using bedrails?: A Little Help needed moving from lying on your back to sitting on the side of a flat bed without using bedrails?: A Little Help needed moving to and from a bed to a chair (including a wheelchair)?: A Lot Help needed standing up from a chair using your arms (e.g., wheelchair or bedside chair)?: A Lot Help needed to walk in hospital room?: Total Help needed climbing 3-5 steps with a railing? : Total 6 Click Score: 12    End of Session Equipment Utilized During Treatment: Oxygen Activity Tolerance: Patient tolerated treatment well Patient left: in bed;with call  bell/phone within reach;with bed alarm set (in chair position) Nurse Communication: Mobility status PT Visit Diagnosis: Difficulty in walking, not elsewhere classified (R26.2)     Time: 2831-5176 PT Time Calculation (min) (ACUTE ONLY): 42 min  Charges:  $Therapeutic Activity: 38-52 mins                     Lauren Youngblood, SPT  Acute rehab     Elna Breslow 08/31/2020, 4:42 PM

## 2020-08-31 NOTE — TOC Progression Note (Signed)
Transition of Care [facility]) - Progression Note    Patient Details  Name: [name] MRN: [id number] Date of Birth: 09-Aug-1950  Transition of Care [facility]) CM/SW Contact  Leeroy Cha, RN Phone Number: 08/31/2020, 8:04 AM  Clinical Narrative:    Patient regressed to using bipap from hfnc, No bed offers at this time. Will continue to follow for toc needs and placement.   Expected Discharge Plan: [place] Barriers to Discharge: No Barriers Identified  Expected Discharge Plan and Services Expected Discharge Plan: [place] In-house Referral: Clinical Social Work     Living arrangements for the past 2 months: Single Family Home                                       Social Determinants of Health (SDOH) Interventions    Readmission Risk Interventions No flowsheet data found.
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
## 2020-09-01 ENCOUNTER — Encounter (HOSPITAL_COMMUNITY): Payer: Self-pay | Admitting: Internal Medicine

## 2020-09-01 DIAGNOSIS — U071 COVID-19: Secondary | ICD-10-CM | POA: Diagnosis not present

## 2020-09-01 DIAGNOSIS — J9601 Acute respiratory failure with hypoxia: Secondary | ICD-10-CM | POA: Diagnosis not present

## 2020-09-01 LAB — GLUCOSE, CAPILLARY
Glucose-Capillary: 90 mg/dL (ref 70–99)
Glucose-Capillary: 148 mg/dL — ABNORMAL HIGH (ref 70–99)
Glucose-Capillary: 192 mg/dL — ABNORMAL HIGH (ref 70–99)
Glucose-Capillary: 109 mg/dL — ABNORMAL HIGH (ref 70–99)

## 2020-09-01 MED ORDER — CHOLECALCIFEROL 10 MCG (400 UNIT) PO TABS
400.0000 [IU] | ORAL_TABLET | Freq: Every day | ORAL | Status: DC
  Administered 2020-09-01 – 2020-09-23 (×23): 400 [IU] via ORAL
  Filled 2020-09-01 (×23): qty 1

## 2020-09-01 MED ORDER — FUROSEMIDE 10 MG/ML IJ SOLN
20.0000 mg | Freq: Once | INTRAMUSCULAR | Status: AC
  Administered 2020-09-01: 20 mg via INTRAVENOUS
  Filled 2020-09-01: qty 2

## 2020-09-01 NOTE — Progress Notes (Signed)
Physical Therapy Treatment Patient Details Name: [name] MRN: [id number] DOB: 1950/11/16 Today's Date: 09/01/2020       PT Comments    Pt remains motivated and continues to progress toward acute PT goals. Pt on 45L/min HHFNC (100%) at start of session. Pt's 02 sats between 83-86% throughout session with NRB (15L) donned. Pt continues to desat with mobility and talking. Pt required min assist for trunk control and LE's with supine<>sit transfer. She desats to 77-84% sitting EOB, PT increased pt to 50L/min and pt recovered to mid-high 80's with cues for pursed lip breathing. Pt required MOD assist +2 for power up to stand from EOB, and was able to take lateral steps toward [facility] for positioning with MOD assist +2 for safety and stability. HR reaching 134 bpm max and RR reaching 44 with recovered to 110's bpm and 20's RR once sitting EOB. Pt left on 50L + NRB at EOS with 02 sat at 90% after ~4 minutes rest, RN aware. Acute therapy to follow up to progress functional mobility as tolerated and maximize independence.          09/01/20 1200  PT Visit Information  Last PT Received On 09/01/20  Assistance Needed +2  History of Present Illness Pt is 70 year old female who has not been vaccinated against COVID-19 with a past medical history of OSA not on CPAP, NICM, chronic systolic heart failure, high PVC burden s/p radiofrequency catheter ablation with [name] on 07/18/2018, hypertension who presented to the ED with 2 days of fever, cough, dyspnea. Pt admitted for  Acute hypoxic respiratory failure and Severe Sepsis without septic shock secondary to COVID-19.  Subjective Data  Subjective I am just so weak today  Patient Stated Goal to get better and go home  Precautions  Precautions Fall  Restrictions  Weight Bearing Restrictions Yes  LUE Weight Bearing WBAT  Other Position/Activity Restrictions pt with left wrist brace, states she is NWB to wrist, pt reports H/O vertigo, takes meclazine  routinely  Pain Assessment  Pain Assessment Faces  Faces Pain Scale 6  Pain Location L wrist, chest, Rt knee  Pain Descriptors / Indicators Tightness;Tender;Discomfort;Grimacing  Pain Intervention(s) Limited activity within patient's tolerance;Monitored during session;Repositioned  Cognition  Arousal/Alertness Awake/alert  Behavior During Therapy WFL for tasks assessed/performed  Overall Cognitive Status Within Functional Limits for tasks assessed  Bed Mobility  Overal bed mobility Needs Assistance  Bed Mobility Supine to Sit  Supine to sit HOB elevated;Min assist  Sit to supine HOB elevated;Min assist  General bed mobility comments patiet requires cues for supine to sit, able to bring LE's off EOB, assist needed to raise trunk upright as pt unable to press up through Lt UE 2/2 wrist pain. Min assist to bring bil LE's onto bed at EOS as pt fagitued, SOB. Mod+2 assist to scoot superiorly in bed; pt assisting with bil LE.s  Transfers  Overall transfer level Needs assistance  Equipment used 2 person hand held assist  Transfers Sit to/from Stand  Sit to Stand +2 physical assistance;+2 safety/equipment;Mod assist  General transfer comment Pt able to stand for ~30s  with MOD assist +2 for safety and power up to stand from EOB. Pt was able to take ~3 lateral steps toward [name] with MOD asisst +2 for safety and to maintain standing balance. Pt's HR reached 130bpm recovering to 100-110's with O2 sats in low 80s and RR reaching up to 44 in standing recovering to 20's.  Balance  Overall balance assessment Needs  assistance  Sitting-balance support Feet supported  Sitting balance-Leahy Scale Fair  Sitting balance - Comments pt able to sit with single-No UE support throughout session.  Standing balance support During functional activity;Bilateral upper extremity supported  Standing balance-Leahy Scale Poor  Standing balance comment use of +2 assist to maintain standing.  General Comments  General  comments (skin integrity, edema, etc.) pt on 45L/min HHFNC (100%). At start of session pt sat 91% and continues to desat into mid-low 80's when talking. Pt sats 83-86% sitting EOB with NRB 15L donned. Pt placed on 50L/min during session prior to stand. After stand pt dropped to 70's and a low of 69%. Pt requried extra time to recover back to >84% with cues for pursed lip breathing. Pt left on 50L HFNC at EOS with NRB at 15L and sats at 88-90%, RN aware.  PT - End of Session  Equipment Utilized During Treatment Oxygen  Activity Tolerance Patient tolerated treatment well;Treatment limited secondary to medical complications (Comment) (desats with mobility)  Patient left in bed;with call bell/phone within reach;with bed alarm set (in modified chair position)  Nurse Communication Mobility status   PT - Assessment/Plan  PT Plan Current plan remains appropriate  PT Visit Diagnosis Difficulty in walking, not elsewhere classified (R26.2)  PT Frequency (ACUTE ONLY) Min 2X/week  Follow Up Recommendations SNF  PT equipment None recommended by PT  AM-PAC PT "6 Clicks" Mobility Outcome Measure (Version 2)  Help needed turning from your back to your side while in a flat bed without using bedrails? 3  Help needed moving from lying on your back to sitting on the side of a flat bed without using bedrails? 3  Help needed moving to and from a bed to a chair (including a wheelchair)? 2  Help needed standing up from a chair using your arms (e.g., wheelchair or bedside chair)? 2  Help needed to walk in hospital room? 1  Help needed climbing 3-5 steps with a railing?  1  6 Click Score 12  Consider Recommendation of Discharge To: CIR/SNF/LTACH  PT Goal Progression  Progress towards PT goals Progressing toward goals  Acute Rehab PT Goals  PT Goal Formulation With patient  Time For Goal Achievement 09/11/20  Potential to Achieve Goals Fair  PT Time Calculation  PT Start Time (ACUTE ONLY) 1129  PT Stop Time  (ACUTE ONLY) 1202  PT Time Calculation (min) (ACUTE ONLY) 33 min  PT General Charges  $$ ACUTE PT VISIT 1 Visit  PT Treatments  $Therapeutic Activity 23-37 mins     Cresta Riden, SPT  Acute rehab     Roberts Bon 09/01/2020, 4:21 PM
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
## 2020-09-02 DIAGNOSIS — E1169 Type 2 diabetes mellitus with other specified complication: Secondary | ICD-10-CM | POA: Diagnosis not present

## 2020-09-02 DIAGNOSIS — I10 Essential (primary) hypertension: Secondary | ICD-10-CM

## 2020-09-02 DIAGNOSIS — U071 COVID-19: Secondary | ICD-10-CM | POA: Diagnosis not present

## 2020-09-02 DIAGNOSIS — I428 Other cardiomyopathies: Secondary | ICD-10-CM | POA: Diagnosis not present

## 2020-09-02 LAB — BASIC METABOLIC PANEL
Anion gap: 11 (ref 5–15)
BUN: 33 mg/dL — ABNORMAL HIGH (ref 8–23)
CO2: 26 mmol/L (ref 22–32)
Calcium: 8.8 mg/dL — ABNORMAL LOW (ref 8.9–10.3)
Chloride: 100 mmol/L (ref 98–111)
Creatinine, Ser: 0.72 mg/dL (ref 0.44–1.00)
GFR, Estimated: >60 mL/min (ref >60)
Glucose, Bld: 90 mg/dL (ref 70–99)
Potassium: 3.2 mmol/L — ABNORMAL LOW (ref 3.5–5.1)
Sodium: 137 mmol/L (ref 135–145)

## 2020-09-02 LAB — GLUCOSE, CAPILLARY
Glucose-Capillary: 84 mg/dL (ref 70–99)
Glucose-Capillary: 163 mg/dL — ABNORMAL HIGH (ref 70–99)
Glucose-Capillary: 200 mg/dL — ABNORMAL HIGH (ref 70–99)
Glucose-Capillary: 179 mg/dL — ABNORMAL HIGH (ref 70–99)

## 2020-09-02 LAB — CBC
HCT: 42 % (ref 36.0–46.0)
Hemoglobin: 13.9 g/dL (ref 12.0–15.0)
MCH: 28.9 pg (ref 26.0–34.0)
MCHC: 33.1 g/dL (ref 30.0–36.0)
MCV: 87.3 fL (ref 80.0–100.0)
Platelets: 132 10*3/uL — ABNORMAL LOW (ref 150–400)
RBC: 4.81 MIL/uL (ref 3.87–5.11)
RDW: 16.5 % — ABNORMAL HIGH (ref 11.5–15.5)
WBC: 5.5 10*3/uL (ref 4.0–10.5)
nRBC: 0 % (ref 0.0–0.2)

## 2020-09-02 MED ORDER — POTASSIUM CHLORIDE CRYS ER 20 MEQ PO TBCR
40.0000 meq | EXTENDED_RELEASE_TABLET | ORAL | Status: AC
  Administered 2020-09-02 (×2): 40 meq via ORAL
  Filled 2020-09-02 (×2): qty 2

## 2020-09-02 NOTE — Progress Notes (Signed)
The patient was being treated in the office for a closed distal radius fracture.  The patient was in a wrist brace.  The patient was last seen in our office in January.  The patient can be weight-bear as tolerated in the wrist.  When she is discharged from the hospital she can follow-up with me in the office.  We are treating the fracture in a closed manner.  Please contact me should any issues or concerns arise.

## 2020-09-02 NOTE — Progress Notes (Signed)
Occupational Therapy Treatment Patient Details Name: [name] MRN: [id number] DOB: [birth year] Today's Date: 09/02/2020    History of present illness Pt is 70 year old female who has not been vaccinated against COVID-19 with a past medical history of OSA not on CPAP, NICM, chronic systolic heart failure, high PVC burden s/p radiofrequency catheter ablation with [name] on 07/18/2018, hypertension who presented to the ED with 2 days of fever, cough, dyspnea. Pt admitted for  Acute hypoxic respiratory failure and Severe Sepsis without septic shock secondary to COVID-19.   OT comments  Treatment focused on persistent and consistent instruction and cues on diaphragmatic breathing - having patient place hands on chest and stomach as a tactile cue. Patient found on 35% HHFNC in supine. Patient's o2 sats drop to low 80s with talking. Placed NRB in preparation for activity which improved o2 sat to low 90s. Patient min assist to transfer to edge of of bed - cue for not using left wrist as she reports it is painful. Continues instruction on breathing technique at edge of bed. Initially o2 sat 91% and therapist removed NRB but quickly  dropped to 77% and therapist replaced. Mild reports of dizziness at edge of bed. Patient min assist x 2 to stand and then pivot to recliner - LE extremities wobbly with mild buckling of left knee but able to take 3-4 steps to recliner with assistance of two. Continues verbal cues for breathing technique. Patient left on NRB in chair to recover - as she had not recovered back 85%.  Therapist provided with handout as reminder for breathing technique. Patient exhibiting improved tolerance for activity today. Continue POC.    Follow Up Recommendations  LTACH;SNF    Equipment Recommendations   (TBD)    Recommendations for Other Services      Precautions / Restrictions Precautions Precautions: Fall Precaution Comments: monitor vitals, currently on 35 L/100% HHFNC , not on  NRB at rest - needed for activity Restrictions Weight Bearing Restrictions: No LUE Weight Bearing: Weight bearing as tolerated Other Position/Activity Restrictions: pt with left wrist brace, states she is NWB to wrist, pt reports H/O vertigo, takes meclazine routinely       Mobility Bed Mobility Overal bed mobility: Needs Assistance Bed Mobility: Supine to Sit     Supine to sit: HOB elevated;+2 for safety/equipment;Min assist     General bed mobility comments: Cue to not use LUE for transfer (patient reporting pain).    Transfers Overall transfer level: Needs assistance Equipment used: 2 person hand held assist Transfers: Sit to/from [facility] Sit to Stand: +2 physical assistance;+2 safety/equipment;Min assist Stand pivot transfers: Min assist;+2 physical assistance;+2 safety/equipment       General transfer comment: Min assist x 2 to stand and then pivot to recliner.    Balance Overall balance assessment: Needs assistance Sitting-balance support: No upper extremity supported;Feet supported Sitting balance-Leahy Scale: Fair Sitting balance - Comments: able to sit edge of bed without UE support     Standing balance-Leahy Scale: Poor Standing balance comment: use of +2 assist to maintain standing.                           ADL either performed or assessed with clinical judgement   ADL  Vision Patient Visual Report: No change from baseline     Perception     Praxis      Cognition Arousal/Alertness: Awake/alert Behavior During Therapy: WFL for tasks assessed/performed Overall Cognitive Status: Within Functional Limits for tasks assessed                                 General Comments: She reports that she is having problems with her memory in the hospital        Exercises Other Exercises Other Exercises: Education on diaphragmatic breathing in  supine, in sitting at EOB and in recliner. Use of bilateral hand placement as a tactile cue. Also hand written paper on table top.   Shoulder Instructions       General Comments      Pertinent Vitals/ Pain       Pain Assessment: Faces Faces Pain Scale: Hurts little more Pain Location: left wrist Pain Descriptors / Indicators: Grimacing;Guarding Pain Intervention(s): Monitored during session  Home Living                                          Prior Functioning/Environment              Frequency  Min 2X/week        Progress Toward Goals  OT Goals(current goals can now be found in the care plan section)  Progress towards OT goals: Progressing toward goals  Acute Rehab OT Goals Patient Stated Goal: to get better and go home OT Goal Formulation: With patient Time For Goal Achievement: 09/09/20 Potential to Achieve Goals: [place] Discharge plan remains appropriate    Co-evaluation          OT goals addressed during session:  (activity tolerance, functional mobility)      AM-PAC OT "6 Clicks" Daily Activity     Outcome Measure   Help from another person eating meals?: None Help from another person taking care of personal grooming?: A Little Help from another person toileting, which includes using toliet, bedpan, or urinal?: A Lot Help from another person bathing (including washing, rinsing, drying)?: A Lot Help from another person to put on and taking off regular upper body clothing?: A Lot Help from another person to put on and taking off regular lower body clothing?: A Lot 6 Click Score: 15    End of Session Equipment Utilized During Treatment: Oxygen  OT Visit Diagnosis: Other abnormalities of gait and mobility (R26.89);Muscle weakness (generalized) (M62.81)   Activity Tolerance Patient tolerated treatment well   Patient Left in chair;with call bell/phone within reach   Nurse Communication Mobility status        Time:  1020-1046 OT Time Calculation (min): 26 min  Charges: OT General Charges $OT Visit: 1 Visit OT Treatments $Self Care/Home Management : 8-22 mins $Therapeutic Activity: 8-22 mins  Javien Tesch, OTR/L [place]  Office [phone] Pager: [place] 09/02/2020, 10:56 AM

## 2020-09-02 NOTE — TOC Progression Note (Signed)
Transition of Care [facility]) - Progression Note    Patient Details  Name: [name] MRN: [id number] Date of Birth: 12/25/1950  Transition of [facility]) CM/SW Contact  Leeroy Cha, RN Phone Number: 09/02/2020, 7:27 AM  Clinical Narrative:    New events last 24 hours / Subjective: Continue to have desaturation episodes with conversation.  She desatted into the 70s this morning, and quickly returned >80 when taking slow and deep breaths through the nose.  She states that she feels stronger, has no other questions or concerns or new symptoms to report.  Her leg weakness has completely resolved. PLAN: following for toc needs.  May need home o2 and pt  Expected Discharge Plan: [place] Barriers to Discharge: No Barriers Identified  Expected Discharge Plan and Services Expected Discharge Plan: [place] In-house Referral: Clinical Social Work     Living arrangements for the past 2 months: Single Family Home                                       Social Determinants of Health (SDOH) Interventions    Readmission Risk Interventions No flowsheet data found.
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
## 2020-09-02 NOTE — TOC Progression Note (Signed)
Transition of Care [facility]) - Progression Note    Patient Details  Name: [name] MRN: [id number] Date of Birth: 06-Apr-1951  Transition of Care [facility]) CM/SW Contact  Leeroy Cha, RN Phone Number: 09/02/2020, 1:52 PM  Clinical Narrative:    Patient referred to both ltachs for review.   Expected Discharge Plan: [place] Barriers to Discharge: No Barriers Identified  Expected Discharge Plan and Services Expected Discharge Plan: [place] In-house Referral: Clinical Social Work     Living arrangements for the past 2 months: Single Family Home                                       Social Determinants of Health (SDOH) Interventions    Readmission Risk Interventions No flowsheet data found.

## 2020-09-03 DIAGNOSIS — U071 COVID-19: Secondary | ICD-10-CM | POA: Diagnosis not present

## 2020-09-03 DIAGNOSIS — E1169 Type 2 diabetes mellitus with other specified complication: Secondary | ICD-10-CM | POA: Diagnosis not present

## 2020-09-03 DIAGNOSIS — I428 Other cardiomyopathies: Secondary | ICD-10-CM | POA: Diagnosis not present

## 2020-09-03 DIAGNOSIS — I10 Essential (primary) hypertension: Secondary | ICD-10-CM | POA: Diagnosis not present

## 2020-09-03 LAB — CBC
HCT: 45.2 % (ref 36.0–46.0)
Hemoglobin: 14.7 g/dL (ref 12.0–15.0)
MCH: 28.7 pg (ref 26.0–34.0)
MCHC: 32.5 g/dL (ref 30.0–36.0)
MCV: 88.1 fL (ref 80.0–100.0)
Platelets: 151 10*3/uL (ref 150–400)
RBC: 5.13 MIL/uL — ABNORMAL HIGH (ref 3.87–5.11)
RDW: 16.7 % — ABNORMAL HIGH (ref 11.5–15.5)
WBC: 5.2 10*3/uL (ref 4.0–10.5)
nRBC: 0 % (ref 0.0–0.2)

## 2020-09-03 LAB — BASIC METABOLIC PANEL
Anion gap: 8 (ref 5–15)
BUN: 29 mg/dL — ABNORMAL HIGH (ref 8–23)
CO2: 29 mmol/L (ref 22–32)
Calcium: 9.2 mg/dL (ref 8.9–10.3)
Chloride: 101 mmol/L (ref 98–111)
Creatinine, Ser: 0.7 mg/dL (ref 0.44–1.00)
GFR, Estimated: >60 mL/min (ref >60)
Glucose, Bld: 102 mg/dL — ABNORMAL HIGH (ref 70–99)
Potassium: 4 mmol/L (ref 3.5–5.1)
Sodium: 138 mmol/L (ref 135–145)

## 2020-09-03 LAB — GLUCOSE, CAPILLARY
Glucose-Capillary: 101 mg/dL — ABNORMAL HIGH (ref 70–99)
Glucose-Capillary: 164 mg/dL — ABNORMAL HIGH (ref 70–99)
Glucose-Capillary: 245 mg/dL — ABNORMAL HIGH (ref 70–99)
Glucose-Capillary: 110 mg/dL — ABNORMAL HIGH (ref 70–99)

## 2020-09-03 NOTE — Progress Notes (Signed)
Physical Therapy Treatment Patient Details Name: [name] MRN: [id number] DOB: [birth year] Today's Date: 09/03/2020    History of Present Illness Pt is 70 year old female who has not been vaccinated against COVID-19 with a past medical history of OSA not on CPAP, NICM, chronic systolic heart failure, high PVC burden s/p radiofrequency catheter ablation with [name] on 07/18/2018, hypertension who presented to the ED with 2 days of fever, cough, dyspnea. Pt admitted for  Acute hypoxic respiratory failure and Severe Sepsis without septic shock secondary to COVID-19.    PT Comments    Pt on 40L/min (100%) HHFNC.  Pt tolerated treatment well today.  Pt was able to perform supine to sit transfer with supervision for safety. Pt required MOD-MAX assist +2 for power up and stability with stand and stand pivot from EOB <>BSC. NRB donned while pt was on [facility] as pt reported she felt like she "couldn't breathe and needed more oxygen." 02 sats reamined in 79%-87% during mobiltiy with HR reaching up to 135bpm and RR 31 with recovery to 110's HR and 20's RR. Pt required MOD assist for progression of B LEs onto bed. Pt left in chair position to promote improvement of upright activity tolerance. NRB removed at EOS and pt 02 sats remained 88%. Acute therapy to follow up during stay to progress functional mobility as able.       Follow Up Recommendations  SNF     Equipment Recommendations  None recommended by PT    Recommendations for Other Services       Precautions / Restrictions Precautions Precautions: Fall Precaution Comments: monitor vitals, currently on 40 L/ (100%) HHFNC, NRB needed for activity Restrictions LUE Weight Bearing: Weight bearing as tolerated    Mobility  Bed Mobility Overal bed mobility: Needs Assistance Bed Mobility: Supine to Sit;Sit to Supine     Supine to sit: HOB elevated;Supervision Sit to supine: Mod assist;HOB elevated   General bed mobility comments: use of B  UEs to scoot to EOB with supervision for safety. MOD assist to lift B LEs back onto EOB at EOS.    Transfers Overall transfer level: Needs assistance Equipment used: 2 person hand held assist Transfers: Sit to/from [facility] Sit to Stand: Mod assist;+2 physical assistance;+2 safety/equipment Stand pivot transfers: Mod assist;Max assist;+2 physical assistance;+2 safety/equipment       General transfer comment: x3: MOD assist +2 for power up and stability with stand and stand pivot EOB <>BSC. NRB donned while pt was on [facility] as pt reported she felt like she "couldn't breathe and needed more oxygen." 02 sats reamined in 79%-87% while on BSC. Pt fatigued after bowel movement while on BSC and required increased MAX assist +2 and cues for full erect posture. Pt's HR reached up to 135bpm and RR 31 with recovery to 110's HR and 20's RR.  Ambulation/Gait                 Stairs             Wheelchair Mobility    Modified Rankin (Stroke Patients Only)       Balance Overall balance assessment: Needs assistance Sitting-balance support: Feet supported Sitting balance-Leahy Scale: Fair     Standing balance support: Bilateral upper extremity supported Standing balance-Leahy Scale: Poor Standing balance comment: use of +2 assist to maintain standing.  Cognition Arousal/Alertness: Awake/alert Behavior During Therapy: Anxious Overall Cognitive Status: Within Functional Limits for tasks assessed                                 General Comments: pt needs encouragement and reassurance that O2 saturations doing well with activity as patient does become anxious." I feel like I can't breath." Therapist providing cues for diaphragmatic and pursed lip breathing for relaxation/calming.      Exercises General Exercises - Lower Extremity Ankle Circles/Pumps: AROM;Both;20 reps;Supine (chair position) Short Arc Quad:  AROM;Both;5 reps;Supine (chair position)    General Comments General comments (skin integrity, edema, etc.): pt on 40L/min (100%) HHFNC. Pt 02 sats at 92-93% at start of session. Pt 02 sats remained in 82-88% during mobilty with low of 79% while on [name] which recovered with cues for pursed lip and diaphragmatic breathing.      Pertinent Vitals/Pain Pain Assessment: Faces Faces Pain Scale: Hurts little more Pain Location: hips with increased flexion in sitting Pain Descriptors / Indicators: Discomfort Pain Intervention(s): Monitored during session;Limited activity within patient's tolerance;Repositioned    Home Living                      Prior Function            PT Goals (current goals can now be found in the care plan section) Acute Rehab PT Goals Patient Stated Goal: to get better and go home PT Goal Formulation: With patient Time For Goal Achievement: 09/11/20 Potential to Achieve Goals: Fair Progress towards PT goals: Progressing toward goals    Frequency    Min 2X/week      PT Plan Current plan remains appropriate    Co-evaluation PT/OT/SLP Co-Evaluation/Treatment: Yes Reason for Co-Treatment: For patient/therapist safety;To address functional/ADL transfers PT goals addressed during session: Mobility/safety with mobility;Strengthening/ROM OT goals addressed during session: ADL's and self-care      AM-PAC PT "6 Clicks" Mobility   Outcome Measure  Help needed turning from your back to your side while in a flat bed without using bedrails?: A Little Help needed moving from lying on your back to sitting on the side of a flat bed without using bedrails?: None Help needed moving to and from a bed to a chair (including a wheelchair)?: A Lot Help needed standing up from a chair using your arms (e.g., wheelchair or bedside chair)?: A Lot Help needed to walk in hospital room?: Total Help needed climbing 3-5 steps with a railing? : Total 6 Click Score: 13     End of Session Equipment Utilized During Treatment: Oxygen Activity Tolerance: Patient tolerated treatment well Patient left: in bed;with call bell/phone within reach;with bed alarm set;with nursing/sitter in room (chair position) Nurse Communication: Mobility status PT Visit Diagnosis: Difficulty in walking, not elsewhere classified (R26.2)     Time: 8546-2703 PT Time Calculation (min) (ACUTE ONLY): 38 min  Charges:  $Therapeutic Exercise: 8-22 mins $Therapeutic Activity: 8-22 mins                     Anisia Leija, SPT  Acute rehab     Elna Breslow 09/03/2020, 4:43 PM

## 2020-09-03 NOTE — Progress Notes (Signed)
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 on 07/18/2018, hypertension who presented to the ED with 2 days of fever, cough, dyspnea. Pt admitted for  Acute hypoxic respiratory failure and Severe Sepsis without septic shock secondary to COVID-19.   OT comments  Patient requiring increased assistance for transfer to [facility] and back to bed. Initially pt mod x2  to pivot to [name], cues for PLB and encouragement as patient becomes anxious post mobility. NRB donned in addition to the 40L HHFNC 100% FIO2 seated on BSC reading mid 80s. Pt then max A x2 to partial stand for pericare needing total A and ultimately sat back onto commode as patient still having BM. Pt needing max A x2 from PT to power up to standing again, with OT providing additional support at pt buttock to assist pivoting back to bed. Note lowest O2 reading during treatment 79-80%, once back to bed pt reading 86-87% with NRB and HHFNC with PT still present. Cont with POC.   Follow Up Recommendations  LTACH;SNF    Equipment Recommendations  None recommended by OT       Precautions / Restrictions Precautions Precautions: Fall Precaution Comments: monitor vitals, currently on 40 L/100% HHFNC , NRB needed for activity Restrictions LUE Weight Bearing: Weight bearing as tolerated       Mobility Bed Mobility Overal bed mobility: Needs Assistance Bed Mobility: Supine to Sit;Sit to Supine     Supine to sit: HOB elevated;Min assist Sit to supine: Mod assist;HOB elevated   General bed mobility comments: mod A to lift LEs back onto EOB    Transfers Overall transfer level: Needs assistance Equipment used: 2  person hand held assist Transfers: Sit to/from [facility] Sit to Stand: Mod assist;+2 physical assistance;+2 safety/equipment Stand pivot transfers: Mod assist;Max assist;+2 physical assistance;+2 safety/equipment       General transfer comment: initial transfer to [facility] mod A x2 to power up to standing and pivot. patient fatigue with having BM on BSC needing max A x2 to power up to stand twice from [facility] and OT providing additional support when pivoting back to bed    Balance Overall balance assessment: Needs assistance Sitting-balance support: Feet supported Sitting balance-Leahy Scale: Fair     Standing balance support: Bilateral upper extremity supported Standing balance-Leahy Scale: Poor Standing balance comment: use of +2 assist to maintain standing.                           ADL either performed or assessed with clinical judgement   ADL Overall ADL's : Needs assistance/impaired                         Toilet Transfer: Moderate assistance;Maximal assistance;+2 for physical assistance;+2 for safety/equipment;Stand-pivot;BSC Toilet Transfer Details (indicate cue type and reason): from EOB to [facility] pt mod A x2 to pivot to recliner, cues for PLB and reassurance to calm patient as she became moderately anxious after tranfer. Pt needing max A x2 to power up to standing from [facility] twice due to not completed BM pt stating "I can't stand my legs won't let me." Toileting- Clothing Manipulation and Hygiene:  Total assistance;Sit to/from stand Toileting - Clothing Manipulation Details (indicate cue type and reason): unable to stand without significant B UE support from PT therefore total A after bowel movement for perianal care     Functional mobility during ADLs: Moderate assistance;Maximal assistance;+2 for physical assistance;+2 for safety/equipment;Cueing for sequencing General ADL Comments: initial transfer to commode without NRB however unable to obtain  pleth wave therefore set up nelcore forehead sensor. Donned NRB and pt reading in mid 80s after prolonged sitting on commode and cues for PLB. with standing activity pt desat to 79-81% however recovered well with 15L NRB and 40L HHFNC               Cognition Arousal/Alertness: Awake/alert Behavior During Therapy: Anxious Overall Cognitive Status: Within Functional Limits for tasks assessed                                 General Comments: pt needs encouragement and reassurance that O2 saturations doing well with activity as patient does become anxious "I feel like I can't breath"                   Pertinent Vitals/ Pain       Pain Assessment: Faces Faces Pain Scale: Hurts little more Pain Location: hips with increased flexion in sitting Pain Descriptors / Indicators: Discomfort Pain Intervention(s): Monitored during session         Frequency  Min 2X/week        Progress Toward Goals  OT Goals(current goals can now be found in the care plan section)  Progress towards OT goals: Progressing toward goals  Acute Rehab OT Goals Patient Stated Goal: to get better and go home OT Goal Formulation: With patient Time For Goal Achievement: 09/09/20 Potential to Achieve Goals: Fair ADL Goals Pt Will Perform Lower Body Dressing: with mod assist;sit to/from stand Pt Will Transfer to Toilet: with min assist;stand pivot transfer;bedside commode Pt Will Perform Toileting - Clothing Manipulation and hygiene: with mod assist;sit to/from stand Pt/caregiver will Perform Home Exercise Program: Increased strength;Both right and left upper extremity;With theraband;Independently  Plan Discharge plan remains appropriate    Co-evaluation    PT/OT/SLP Co-Evaluation/Treatment: Yes Reason for Co-Treatment: For patient/therapist safety;To address functional/ADL transfers   OT goals addressed during session: ADL's and self-care      AM-PAC OT "6 Clicks" Daily Activity      Outcome Measure   Help from another person eating meals?: None Help from another person taking care of personal grooming?: A Little Help from another person toileting, which includes using toliet, bedpan, or urinal?: A Lot Help from another person bathing (including washing, rinsing, drying)?: A Lot Help from another person to put on and taking off regular upper body clothing?: A Lot Help from another person to put on and taking off regular lower body clothing?: A Lot 6 Click Score: 15    End of Session Equipment Utilized During Treatment: Oxygen  OT Visit Diagnosis: Other abnormalities of gait and mobility (R26.89);Muscle weakness (generalized) (M62.81)   Activity Tolerance Patient tolerated treatment well   Patient Left in bed;with call bell/phone within reach   Nurse Communication Mobility status;Other (comment) (O2 sats with activity)        Time: 7591-6384 OT Time Calculation (min): 31 min  Charges: OT General Charges $OT Visit: 1 Visit OT Treatments $Self Care/Home Management : 8-22 mins  Delbert Phenix OT OT pager: [phone]  Rosemary Holms 09/03/2020, 2:29 PM
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
## 2020-09-04 DIAGNOSIS — E1169 Type 2 diabetes mellitus with other specified complication: Secondary | ICD-10-CM | POA: Diagnosis not present

## 2020-09-04 DIAGNOSIS — I10 Essential (primary) hypertension: Secondary | ICD-10-CM | POA: Diagnosis not present

## 2020-09-04 DIAGNOSIS — U071 COVID-19: Secondary | ICD-10-CM | POA: Diagnosis not present

## 2020-09-04 DIAGNOSIS — I428 Other cardiomyopathies: Secondary | ICD-10-CM | POA: Diagnosis not present

## 2020-09-04 LAB — GLUCOSE, CAPILLARY
Glucose-Capillary: 89 mg/dL (ref 70–99)
Glucose-Capillary: 122 mg/dL — ABNORMAL HIGH (ref 70–99)
Glucose-Capillary: 205 mg/dL — ABNORMAL HIGH (ref 70–99)
Glucose-Capillary: 136 mg/dL — ABNORMAL HIGH (ref 70–99)

## 2020-09-04 MED ORDER — IPRATROPIUM-ALBUTEROL 20-100 MCG/ACT IN AERS
1.0000 | INHALATION_SPRAY | Freq: Four times a day (QID) | RESPIRATORY_TRACT | Status: DC | PRN
  Administered 2020-09-18: 1 via RESPIRATORY_TRACT
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
## 2020-09-04 NOTE — Progress Notes (Signed)
Physical Therapy Treatment Patient Details Name: [name] MRN: [id number] DOB: 1950-11-11 Today's Date: 09/04/2020    History of Present Illness Pt is 70 year old female who has not been vaccinated against COVID-19 with a past medical history of OSA not on CPAP, NICM, chronic systolic heart failure, high PVC burden s/p radiofrequency catheter ablation with [name] on 07/18/2018, hypertension who presented to the ED with 2 days of fever, cough, dyspnea. Pt admitted for  Acute hypoxic respiratory failure and Severe Sepsis without septic shock secondary to COVID-19.    PT Comments    Pt progressing toward goals. Reports she has been doing exercises on her own. Reviewed use of flutter valve, pt able to return demo.  Pt O2 requirements decr from last PT session--currently 35L/min/90% FiO2 PRB,HFNC.  Pt SpO2= 90s to 76% (at lowest with ~ 2 minutes to recover), returns to 90s at rest.  HR 107--135--112 decr assist overall today. Encouraged OOB and pt to work on LE exercises while in chair. Continue follow in acute setting, LTACH vs SNF   Follow Up Recommendations  SNF vs LTACH     Equipment Recommendations  None recommended by PT    Recommendations for Other Services       Precautions / Restrictions Precautions Precautions: Fall Precaution Comments: monitor vitals, currently on 35L/ 90%FiO2 PRB, HFNC Required Braces or Orthoses: Other Brace Other Brace: wears L wrist brace d/t previous injury Restrictions Weight Bearing Restrictions: No LUE Weight Bearing: Weight bearing as tolerated    Mobility  Bed Mobility Overal bed mobility: Needs Assistance Bed Mobility: Supine to Sit     Supine to sit: Min assist;HOB elevated     General bed mobility comments: assist to elevate trunk, likes to pull on therapist's hand    Transfers Overall transfer level: Needs assistance Equipment used: 2 person hand held assist Transfers: Sit to/from [employer] Sit to  Stand: Min assist;+2 physical assistance;+2 safety/equipment;From elevated surface Stand pivot transfers: Min assist;+2 physical assistance;+2 safety/equipment       General transfer comment: verbal cues to power up with LEs, extend hips and knees. assist to rise and transiton to full standing. pt feels she cannot use RW, holds on to therapists for stand pivot to chair  Ambulation/Gait                 Stairs             Wheelchair Mobility    Modified Rankin (Stroke Patients Only)       Balance   Sitting-balance support: Feet supported Sitting balance-Leahy Scale: Fair Sitting balance - Comments: able to sit edge of bed without UE support       Standing balance comment: use of +2 assist to maintain standing.                            Cognition Arousal/Alertness: Awake/alert Behavior During Therapy: Anxious Overall Cognitive Status: Within Functional Limits for tasks assessed                                        Exercises      General Comments        Pertinent Vitals/Pain Pain Assessment: No/denies pain (feet "numb")    Home Living  Prior Function            PT Goals (current goals can now be found in the care plan section) Acute Rehab PT Goals Patient Stated Goal: to get better and go home PT Goal Formulation: With patient Time For Goal Achievement: 09/11/20 Potential to Achieve Goals: Fair Progress towards PT goals: Progressing toward goals    Frequency    Min 2X/week      PT Plan Current plan remains appropriate    Co-evaluation              AM-PAC PT "6 Clicks" Mobility   Outcome Measure  Help needed turning from your back to your side while in a flat bed without using bedrails?: A Little Help needed moving from lying on your back to sitting on the side of a flat bed without using bedrails?: A Little Help needed moving to and from a bed to a chair (including a  wheelchair)?: A Little Help needed standing up from a chair using your arms (e.g., wheelchair or bedside chair)?: A Lot Help needed to walk in hospital room?: Total Help needed climbing 3-5 steps with a railing? : Total 6 Click Score: 13    End of Session Equipment Utilized During Treatment: Gait belt;Oxygen Activity Tolerance: Patient tolerated treatment well Patient left: in chair;with call bell/phone within reach;with chair alarm set   PT Visit Diagnosis: Difficulty in walking, not elsewhere classified (R26.2)     Time: 6295-2841 PT Time Calculation (min) (ACUTE ONLY): 28 min  Charges:  $Therapeutic Activity: 23-37 mins                     Baxter Flattery, PT  Acute Rehab Dept ([place]) [phone] Pager [phone]  09/04/2020    [facility] 09/04/2020, 2:51 PM

## 2020-09-05 DIAGNOSIS — I428 Other cardiomyopathies: Secondary | ICD-10-CM | POA: Diagnosis not present

## 2020-09-05 DIAGNOSIS — E1169 Type 2 diabetes mellitus with other specified complication: Secondary | ICD-10-CM | POA: Diagnosis not present

## 2020-09-05 DIAGNOSIS — U071 COVID-19: Secondary | ICD-10-CM | POA: Diagnosis not present

## 2020-09-05 DIAGNOSIS — I10 Essential (primary) hypertension: Secondary | ICD-10-CM | POA: Diagnosis not present

## 2020-09-05 LAB — EXPECTORATED SPUTUM ASSESSMENT W GRAM STAIN, RFLX TO RESP C

## 2020-09-05 LAB — MRSA PCR SCREENING: MRSA by PCR: POSITIVE — AB

## 2020-09-05 LAB — GLUCOSE, CAPILLARY
Glucose-Capillary: 101 mg/dL — ABNORMAL HIGH (ref 70–99)
Glucose-Capillary: 136 mg/dL — ABNORMAL HIGH (ref 70–99)
Glucose-Capillary: 166 mg/dL — ABNORMAL HIGH (ref 70–99)
Glucose-Capillary: 130 mg/dL — ABNORMAL HIGH (ref 70–99)

## 2020-09-05 MED ORDER — MUPIROCIN 2 % EX OINT
TOPICAL_OINTMENT | Freq: Two times a day (BID) | CUTANEOUS | Status: AC
  Administered 2020-09-08: 1 via NASAL
  Filled 2020-09-05 (×3): qty 22

## 2020-09-05 NOTE — Plan of Care (Signed)
  Problem: Nutrition: Goal: Adequate nutrition will be maintained Outcome: Progressing   

## 2020-09-05 NOTE — Plan of Care (Signed)
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
## 2020-09-06 DIAGNOSIS — E1169 Type 2 diabetes mellitus with other specified complication: Secondary | ICD-10-CM | POA: Diagnosis not present

## 2020-09-06 DIAGNOSIS — I10 Essential (primary) hypertension: Secondary | ICD-10-CM | POA: Diagnosis not present

## 2020-09-06 DIAGNOSIS — I428 Other cardiomyopathies: Secondary | ICD-10-CM | POA: Diagnosis not present

## 2020-09-06 DIAGNOSIS — U071 COVID-19: Secondary | ICD-10-CM | POA: Diagnosis not present

## 2020-09-06 LAB — BASIC METABOLIC PANEL
Anion gap: 9 (ref 5–15)
BUN: 23 mg/dL (ref 8–23)
CO2: 25 mmol/L (ref 22–32)
Calcium: 8.8 mg/dL — ABNORMAL LOW (ref 8.9–10.3)
Chloride: 103 mmol/L (ref 98–111)
Creatinine, Ser: 0.65 mg/dL (ref 0.44–1.00)
GFR, Estimated: >60 mL/min (ref >60)
Glucose, Bld: 103 mg/dL — ABNORMAL HIGH (ref 70–99)
Potassium: 2.8 mmol/L — ABNORMAL LOW (ref 3.5–5.1)
Sodium: 137 mmol/L (ref 135–145)

## 2020-09-06 LAB — CBC
HCT: 38.7 % (ref 36.0–46.0)
Hemoglobin: 13 g/dL (ref 12.0–15.0)
MCH: 29 pg (ref 26.0–34.0)
MCHC: 33.6 g/dL (ref 30.0–36.0)
MCV: 86.4 fL (ref 80.0–100.0)
Platelets: 152 10*3/uL (ref 150–400)
RBC: 4.48 MIL/uL (ref 3.87–5.11)
RDW: 16 % — ABNORMAL HIGH (ref 11.5–15.5)
WBC: 5.9 10*3/uL (ref 4.0–10.5)
nRBC: 0 % (ref 0.0–0.2)

## 2020-09-06 LAB — GLUCOSE, CAPILLARY
Glucose-Capillary: 96 mg/dL (ref 70–99)
Glucose-Capillary: 190 mg/dL — ABNORMAL HIGH (ref 70–99)
Glucose-Capillary: 189 mg/dL — ABNORMAL HIGH (ref 70–99)
Glucose-Capillary: 151 mg/dL — ABNORMAL HIGH (ref 70–99)

## 2020-09-06 MED ORDER — MAGIC MOUTHWASH
10.0000 mL | Freq: Three times a day (TID) | ORAL | Status: AC
  Administered 2020-09-06 – 2020-09-12 (×19): 10 mL via ORAL
  Filled 2020-09-06 (×22): qty 10

## 2020-09-06 NOTE — Progress Notes (Signed)
Took patient off of BiPAP and placed on heated high flow nasal cannula 30L/ and 100%, SATS 93%, will continue to monitor patient.
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
## 2020-09-07 DIAGNOSIS — I428 Other cardiomyopathies: Secondary | ICD-10-CM | POA: Diagnosis not present

## 2020-09-07 DIAGNOSIS — E1169 Type 2 diabetes mellitus with other specified complication: Secondary | ICD-10-CM | POA: Diagnosis not present

## 2020-09-07 DIAGNOSIS — I10 Essential (primary) hypertension: Secondary | ICD-10-CM | POA: Diagnosis not present

## 2020-09-07 DIAGNOSIS — U071 COVID-19: Secondary | ICD-10-CM | POA: Diagnosis not present

## 2020-09-07 LAB — BASIC METABOLIC PANEL
Anion gap: 11 (ref 5–15)
BUN: 22 mg/dL (ref 8–23)
CO2: 26 mmol/L (ref 22–32)
Calcium: 8.9 mg/dL (ref 8.9–10.3)
Chloride: 101 mmol/L (ref 98–111)
Creatinine, Ser: 0.63 mg/dL (ref 0.44–1.00)
GFR, Estimated: >60 mL/min (ref >60)
Glucose, Bld: 102 mg/dL — ABNORMAL HIGH (ref 70–99)
Potassium: 2.6 mmol/L — CL (ref 3.5–5.1)
Sodium: 138 mmol/L (ref 135–145)

## 2020-09-07 LAB — GLUCOSE, CAPILLARY
Glucose-Capillary: 101 mg/dL — ABNORMAL HIGH (ref 70–99)
Glucose-Capillary: 188 mg/dL — ABNORMAL HIGH (ref 70–99)
Glucose-Capillary: 210 mg/dL — ABNORMAL HIGH (ref 70–99)

## 2020-09-07 LAB — MAGNESIUM: Magnesium: 1.8 mg/dL (ref 1.7–2.4)

## 2020-09-07 MED ORDER — POTASSIUM CHLORIDE CRYS ER 20 MEQ PO TBCR
40.0000 meq | EXTENDED_RELEASE_TABLET | ORAL | Status: AC
  Administered 2020-09-07 (×3): 40 meq via ORAL
  Filled 2020-09-07 (×3): qty 2

## 2020-09-07 NOTE — Progress Notes (Signed)
PT Cancellation Note  Patient Details Name: [name] MRN: [id number] DOB: [birth year]   Cancelled Treatment:    Reason Eval/Treat Not Completed: Medical issues which prohibited therapy (pt's potassium level at 2.6. Pt is not approrpiate for PT at this time. Therapy to follow up as patient appropriate and schedule allows.)  Elna Breslow, SPT  Acute rehab   Elna Breslow 09/07/2020, 11:21 AM
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
## 2020-09-07 NOTE — Care Management Important Message (Signed)
Important Message  Patient Details IM Letter given to the Patient. Name: [name] MRN: [id number] Date of Birth: 06-17-1951   Medicare Important Message Given:  Yes     [name] 09/07/2020, 2:37 PM

## 2020-09-07 NOTE — Plan of Care (Signed)

## 2020-09-08 ENCOUNTER — Inpatient Hospital Stay (HOSPITAL_COMMUNITY): Payer: Medicare HMO

## 2020-09-08 DIAGNOSIS — I10 Essential (primary) hypertension: Secondary | ICD-10-CM | POA: Diagnosis not present

## 2020-09-08 DIAGNOSIS — E1169 Type 2 diabetes mellitus with other specified complication: Secondary | ICD-10-CM | POA: Diagnosis not present

## 2020-09-08 DIAGNOSIS — U071 COVID-19: Secondary | ICD-10-CM | POA: Diagnosis not present

## 2020-09-08 DIAGNOSIS — I428 Other cardiomyopathies: Secondary | ICD-10-CM | POA: Diagnosis not present

## 2020-09-08 LAB — BASIC METABOLIC PANEL
Anion gap: 10 (ref 5–15)
BUN: 23 mg/dL (ref 8–23)
CO2: 24 mmol/L (ref 22–32)
Calcium: 9.7 mg/dL (ref 8.9–10.3)
Chloride: 105 mmol/L (ref 98–111)
Creatinine, Ser: 0.68 mg/dL (ref 0.44–1.00)
GFR, Estimated: >60 mL/min (ref >60)
Glucose, Bld: 128 mg/dL — ABNORMAL HIGH (ref 70–99)
Potassium: 3.8 mmol/L (ref 3.5–5.1)
Sodium: 139 mmol/L (ref 135–145)

## 2020-09-08 LAB — GLUCOSE, CAPILLARY
Glucose-Capillary: 136 mg/dL — ABNORMAL HIGH (ref 70–99)
Glucose-Capillary: 143 mg/dL — ABNORMAL HIGH (ref 70–99)
Glucose-Capillary: 152 mg/dL — ABNORMAL HIGH (ref 70–99)
Glucose-Capillary: 185 mg/dL — ABNORMAL HIGH (ref 70–99)
Glucose-Capillary: 229 mg/dL — ABNORMAL HIGH (ref 70–99)

## 2020-09-08 LAB — CULTURE, RESPIRATORY W GRAM STAIN: Culture: NORMAL

## 2020-09-08 MED ORDER — FUROSEMIDE 40 MG PO TABS
40.0000 mg | ORAL_TABLET | Freq: Every day | ORAL | Status: DC
  Administered 2020-09-09 – 2020-09-23 (×14): 40 mg via ORAL
  Filled 2020-09-08 (×15): qty 1

## 2020-09-08 MED ORDER — PREDNISONE 20 MG PO TABS
30.0000 mg | ORAL_TABLET | Freq: Every day | ORAL | Status: DC
  Administered 2020-09-09 – 2020-09-17 (×9): 30 mg via ORAL
  Filled 2020-09-08 (×10): qty 1

## 2020-09-08 MED ORDER — POTASSIUM CHLORIDE CRYS ER 20 MEQ PO TBCR
40.0000 meq | EXTENDED_RELEASE_TABLET | Freq: Every day | ORAL | Status: DC
  Administered 2020-09-09 – 2020-09-23 (×15): 40 meq via ORAL
  Filled 2020-09-08 (×15): qty 2

## 2020-09-08 NOTE — TOC Progression Note (Signed)
Transition of Care [facility]) - Progression Note    Patient Details  Name: [name] MRN: [id number] Date of Birth: [birth year]  Transition of Care [facility]) CM/SW Contact  Joaquin Courts, RN Phone Number: 09/08/2020, 9:47 AM  Clinical Narrative:    CM spoke with [employer] rep Delsa Sale, patient continues on 90% FiO2, [facility] needs FiO2 at 65% or lower for admission.  CM spoke with respiratory therapy who reports they are attempting to wean but this is proving challenging, patient desaturates once FiO2 is lowered.  TOC and LTAC will continue to follow patient.  Will need insurance auth initiated once FiO2 is 65% or below.   Expected Discharge Plan: [place] Barriers to Discharge: No Barriers Identified  Expected Discharge Plan and Services Expected Discharge Plan: [place] In-house Referral: Clinical Social Work     Living arrangements for the past 2 months: Single Family Home                                       Social Determinants of Health (SDOH) Interventions    Readmission Risk Interventions No flowsheet data found.
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
## 2020-09-08 NOTE — Plan of Care (Signed)
  Problem: Education: Goal: Knowledge of risk factors and measures for prevention of condition will improve Outcome: Progressing   Problem: Coping: Goal: Psychosocial and spiritual needs will be supported Outcome: Progressing   Problem: Coping: Goal: Level of anxiety will decrease Outcome: Progressing   Problem: Nutrition: Goal: Adequate nutrition will be maintained Outcome: Progressing   Problem: Pain Managment: Goal: General experience of comfort will improve Outcome: Progressing   Problem: Safety: Goal: Ability to remain free from injury will improve Outcome: Progressing

## 2020-09-08 NOTE — Progress Notes (Signed)
OT Cancellation Note  Patient Details Name: [name] MRN: [id number] DOB: October 17, 1950   Cancelled Treatment:    Reason Eval/Treat Not Completed: Medical issues which prohibited therapy (K of 2.6) OT will continue to follow acutely  Jaci Carrel 09/08/2020, 8:03 AM   [place] Pager: [phone] Office: [phone]

## 2020-09-08 NOTE — Progress Notes (Signed)
Physical Therapy Treatment Patient Details Name: [name] MRN: [id number] DOB: [birth year] Today's Date: 09/08/2020    History of Present Illness Pt is 70 year old female who has not been vaccinated against COVID-19 with a past medical history of OSA not on CPAP, NICM, chronic systolic heart failure, high PVC burden s/p radiofrequency catheter ablation with [name] on 07/18/2018, hypertension who presented to the ED with 2 days of fever, cough, dyspnea. Pt admitted for  Acute hypoxic respiratory failure and Severe Sepsis without septic shock secondary to COVID-19.    PT Comments    Pt agreeable to mobilize however reporting fear of BM so assisted with bed pan first (however no BM).  Pt attempting to get to EOB however reported vertigo and requested antivert so RN into room to give med.  Pt then agreeable to get OOB to recliner.  Pt sat EOB for at least 5 minutes appearing anxious however denies anxiety.  Pt did require NRB as SPO2 remained in mid 70s for a few minutes.  Pt cued to perform diaphragmatic breathing and SpO2 improved.  Pt assisted to recliner and reports LE weakness.  Pt encouraged to take antivert if needed to be OOB daily with nursing.   Follow Up Recommendations  SNF;LTACH (vs)     Equipment Recommendations  None recommended by PT    Recommendations for Other Services       Precautions / Restrictions Precautions Precautions: Fall Precaution Comments: monitor vitals, currently on 30L/ 85%FiO2 HFNC, NRB as needed for activity/recovery Required Braces or Orthoses: Other Brace Other Brace: wears L wrist brace d/t previous injury    Mobility  Bed Mobility Overal bed mobility: Needs Assistance Bed Mobility: Supine to Sit     Supine to sit: Min guard     General bed mobility comments: pt held rail to self assist and therapist provided a hand for pt to pull herself upright; SpO2 76% on HFNC 30 L/min 85% with sitting EOB for 3 minutes so applied NRB and improved to  93% with breathing    Transfers Overall transfer level: Needs assistance Equipment used: 2 person hand held assist Transfers: Sit to/from [facility] Sit to Stand: Min assist;+2 safety/equipment Stand pivot transfers: Min assist;+2 safety/equipment       General transfer comment: verbal cues for hand placement and weight shifting, blocked one knee for safety in case of buckling as pt reports weakness, SPO2 91% after transfer with HFNC and NRB  Ambulation/Gait             General Gait Details: pt reports tingling "all over", continues to have dyspnea; appears to have anxiety although she denies feeling anxious   Stairs             Wheelchair Mobility    Modified Rankin (Stroke Patients Only)       Balance                                            Cognition Arousal/Alertness: Awake/alert Behavior During Therapy: Anxious Overall Cognitive Status: Within Functional Limits for tasks assessed                                        Exercises General Exercises - Lower Extremity Straight Leg Raises: AROM;Both;5 reps  General Comments        Pertinent Vitals/Pain Pain Assessment: Faces Faces Pain Scale: Hurts little more Pain Location: tingling sensation "all over" Pain Intervention(s): Other (comment);Monitored during session Scientist, forensic in room, pt reports she has been saying this since this morning)    Home Living                      Prior Function            PT Goals (current goals can now be found in the care plan section) Acute Rehab PT Goals PT Goal Formulation: With patient Time For Goal Achievement: 09/22/20 Potential to Achieve Goals: Fair Progress towards PT goals: Progressing toward goals    Frequency    Min 2X/week      PT Plan Current plan remains appropriate    Co-evaluation              AM-PAC PT "6 Clicks" Mobility   Outcome Measure  Help needed  turning from your back to your side while in a flat bed without using bedrails?: A Little Help needed moving from lying on your back to sitting on the side of a flat bed without using bedrails?: A Little Help needed moving to and from a bed to a chair (including a wheelchair)?: A Little Help needed standing up from a chair using your arms (e.g., wheelchair or bedside chair)?: A Lot Help needed to walk in hospital room?: A Lot Help needed climbing 3-5 steps with a railing? : Total 6 Click Score: 14    End of Session Equipment Utilized During Treatment: Gait belt;Oxygen Activity Tolerance: Patient tolerated treatment well Patient left: in chair;with call bell/phone within reach;with chair alarm set Nurse Communication: Mobility status PT Visit Diagnosis: Difficulty in walking, not elsewhere classified (R26.2);Muscle weakness (generalized) (M62.81)     Time: 0165-5374 PT Time Calculation (min) (ACUTE ONLY): 27 min  Charges:  $Therapeutic Activity: 23-37 mins                     Jannette Spanner PT, DPT Acute Rehabilitation Services Pager: [phone] Office: [phone]  York Ram E 09/08/2020, 3:45 PM

## 2020-09-09 DIAGNOSIS — E1169 Type 2 diabetes mellitus with other specified complication: Secondary | ICD-10-CM | POA: Diagnosis not present

## 2020-09-09 DIAGNOSIS — J9601 Acute respiratory failure with hypoxia: Secondary | ICD-10-CM | POA: Diagnosis not present

## 2020-09-09 DIAGNOSIS — T7401XA Adult neglect or abandonment, confirmed, initial encounter: Secondary | ICD-10-CM | POA: Diagnosis not present

## 2020-09-09 DIAGNOSIS — U071 COVID-19: Secondary | ICD-10-CM | POA: Diagnosis not present

## 2020-09-09 LAB — CBC
HCT: 41.4 % (ref 36.0–46.0)
Hemoglobin: 13.5 g/dL (ref 12.0–15.0)
MCH: 28.6 pg (ref 26.0–34.0)
MCHC: 32.6 g/dL (ref 30.0–36.0)
MCV: 87.7 fL (ref 80.0–100.0)
Platelets: 179 10*3/uL (ref 150–400)
RBC: 4.72 MIL/uL (ref 3.87–5.11)
RDW: 16.4 % — ABNORMAL HIGH (ref 11.5–15.5)
WBC: 7.6 10*3/uL (ref 4.0–10.5)
nRBC: 0.5 % — ABNORMAL HIGH (ref 0.0–0.2)

## 2020-09-09 LAB — GLUCOSE, CAPILLARY
Glucose-Capillary: 93 mg/dL (ref 70–99)
Glucose-Capillary: 121 mg/dL — ABNORMAL HIGH (ref 70–99)
Glucose-Capillary: 208 mg/dL — ABNORMAL HIGH (ref 70–99)

## 2020-09-09 LAB — BASIC METABOLIC PANEL
Anion gap: 9 (ref 5–15)
BUN: 22 mg/dL (ref 8–23)
CO2: 25 mmol/L (ref 22–32)
Calcium: 9.1 mg/dL (ref 8.9–10.3)
Chloride: 102 mmol/L (ref 98–111)
Creatinine, Ser: 0.7 mg/dL (ref 0.44–1.00)
GFR, Estimated: >60 mL/min (ref >60)
Glucose, Bld: 103 mg/dL — ABNORMAL HIGH (ref 70–99)
Potassium: 3.7 mmol/L (ref 3.5–5.1)
Sodium: 136 mmol/L (ref 135–145)

## 2020-09-09 NOTE — Progress Notes (Signed)
Occupational Therapy Treatment Patient Details Name: [name] MRN: [id number] DOB: 1950/08/24 Today's Date: 09/09/2020    History of present illness Pt is 70 year old female who has not been vaccinated against COVID-19 with a past medical history of OSA not on CPAP, NICM, chronic systolic heart failure, high PVC burden s/p radiofrequency catheter ablation with [name] on 07/18/2018, hypertension who presented to the ED with 2 days of fever, cough, dyspnea. Pt admitted for  Acute hypoxic respiratory failure and Severe Sepsis without septic shock secondary to COVID-19.   OT comments  Patient agreeable to edge of bed activity today, states her knees are too weak and painful to try standing. Patient participate in exercises listed below with min cues for technique. Pt desat to 84-86% with exercise on 15L NRB and 30L 85% FiO2 however recovers quickly. Cue patient in scooting up to head of bed, after approximately 5 scoots pt desat to 75% and wanting to return quickly to bed with min G assist for LE management. Pt recover to mid 80s once semi-supine and RN present. Will continue with POC to try and continue improving activity tolerance and cardiopulmonary status to reduce oxygenation needs in order for patient to D/C to venue listed below.   Follow Up Recommendations  LTACH    Equipment Recommendations  None recommended by OT       Precautions / Restrictions Precautions Precautions: Fall Precaution Comments: monitor vitals, currently on 30L/ 85%FiO2 HFNC, NRB as needed for activity/recovery Required Braces or Orthoses: Other Brace Other Brace: wears L wrist brace d/t previous injury       Mobility Bed Mobility Overal bed mobility: Needs Assistance Bed Mobility: Rolling;Sidelying to Sit;Sit to Sidelying Rolling: Supervision Sidelying to sit: Min guard;HOB elevated     Sit to sidelying: Min guard General bed mobility comments: patient able to utilize bed rails to assist with bed  mobility, min G for LE management    Transfers                 General transfer comment: patient declined attempting to stand "my knees are too weak and painful today" was able to scoot up to head of bed at min G level. pt does desat to high 70s with NRB on post scooting activity    Balance Overall balance assessment: Needs assistance Sitting-balance support: Feet supported Sitting balance-Leahy Scale: Good                                                  Cognition Arousal/Alertness: Awake/alert Behavior During Therapy: Anxious Overall Cognitive Status: Within Functional Limits for tasks assessed                                 General Comments: increased anxiety with mobility at EOB        Exercises Exercises: General Upper Extremity General Exercises - Upper Extremity Shoulder Horizontal ABduction: Strengthening;20 reps;Theraband;Both;Seated Theraband Level (Shoulder Horizontal Abduction): Level 2 (Red) Elbow Flexion: Right;Strengthening;20 reps;Seated;Theraband Theraband Level (Elbow Flexion): Level 2 (Red) Elbow Extension: Both;Strengthening;20 reps;Seated;Theraband Theraband Level (Elbow Extension): Level 2 (Red)           Pertinent Vitals/ Pain       Pain Assessment: Faces Faces Pain Scale: No hurt         Frequency  Min 2X/week        Progress Toward Goals  OT Goals(current goals can now be found in the care plan section)  Progress towards OT goals: Not progressing toward goals - comment (continues to need high level O2/FIO2 and desating with activity)  Acute Rehab OT Goals Patient Stated Goal: to get better and go home OT Goal Formulation: With patient Time For Goal Achievement: 09/23/20 Potential to Achieve Goals: Fair ADL Goals Pt Will Perform Lower Body Dressing: sit to/from stand;with min assist Pt Will Transfer to Toilet: with min assist;stand pivot transfer;bedside commode Pt Will Perform Toileting  - Clothing Manipulation and hygiene: with min assist;sit to/from stand Pt/caregiver will Perform Home Exercise Program: Increased strength;Both right and left upper extremity;With theraband;Independently  Plan Discharge plan remains appropriate       AM-PAC OT "6 Clicks" Daily Activity     Outcome Measure   Help from another person eating meals?: None Help from another person taking care of personal grooming?: A Little Help from another person toileting, which includes using toliet, bedpan, or urinal?: A Lot Help from another person bathing (including washing, rinsing, drying)?: A Lot Help from another person to put on and taking off regular upper body clothing?: A Lot Help from another person to put on and taking off regular lower body clothing?: A Lot 6 Click Score: 15    End of Session Equipment Utilized During Treatment: Oxygen  OT Visit Diagnosis: Other abnormalities of gait and mobility (R26.89);Muscle weakness (generalized) (M62.81)   Activity Tolerance Patient limited by fatigue   Patient Left in bed;with call bell/phone within reach;with bed alarm set;with nursing/sitter in room   Nurse Communication Mobility status;Other (comment) (sats with activity)        Time: 1696-7893 OT Time Calculation (min): 19 min  Charges: OT General Charges $OT Visit: 1 Visit OT Treatments $Therapeutic Exercise: 8-22 mins  Delbert Phenix OT OT pager: [place] 09/09/2020, 2:08 PM
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
## 2020-09-09 NOTE — Plan of Care (Signed)
  Problem: Education: Goal: Knowledge of risk factors and measures for prevention of condition will improve Outcome: Progressing   Problem: Coping: Goal: Psychosocial and spiritual needs will be supported Outcome: Progressing   Problem: Respiratory: Goal: Will maintain a patent airway Outcome: Progressing   Problem: Education: Goal: Knowledge of General Education information will improve Description: Including pain rating scale, medication(s)/side effects and non-pharmacologic comfort measures Outcome: Progressing   Problem: Nutrition: Goal: Adequate nutrition will be maintained Outcome: Progressing   Problem: Coping: Goal: Level of anxiety will decrease Outcome: Progressing

## 2020-09-10 DIAGNOSIS — U071 COVID-19: Secondary | ICD-10-CM | POA: Diagnosis not present

## 2020-09-10 DIAGNOSIS — J9601 Acute respiratory failure with hypoxia: Secondary | ICD-10-CM | POA: Diagnosis not present

## 2020-09-10 DIAGNOSIS — T7401XA Adult neglect or abandonment, confirmed, initial encounter: Secondary | ICD-10-CM | POA: Diagnosis not present

## 2020-09-10 DIAGNOSIS — E1169 Type 2 diabetes mellitus with other specified complication: Secondary | ICD-10-CM | POA: Diagnosis not present

## 2020-09-10 LAB — GLUCOSE, CAPILLARY
Glucose-Capillary: 110 mg/dL — ABNORMAL HIGH (ref 70–99)
Glucose-Capillary: 180 mg/dL — ABNORMAL HIGH (ref 70–99)
Glucose-Capillary: 198 mg/dL — ABNORMAL HIGH (ref 70–99)
Glucose-Capillary: 155 mg/dL — ABNORMAL HIGH (ref 70–99)

## 2020-09-10 MED ORDER — INSULIN ASPART 100 UNIT/ML ~~LOC~~ SOLN
2.0000 [IU] | Freq: Three times a day (TID) | SUBCUTANEOUS | Status: DC
  Administered 2020-09-11 – 2020-09-15 (×13): 2 [IU] via SUBCUTANEOUS

## 2020-09-10 NOTE — Progress Notes (Signed)
Physical Therapy Treatment Patient Details Name: [name] MRN: [id number] DOB: 03/24/1951 Today's Date: 09/10/2020    History of Present Illness Pt is 70 year old female who has not been vaccinated against COVID-19 with a past medical history of OSA not on CPAP, NICM, chronic systolic heart failure, high PVC burden s/p radiofrequency catheter ablation with [name] on 07/18/2018, hypertension who presented to the ED with 2 days of fever, cough, dyspnea. Pt admitted for  Acute hypoxic respiratory failure and Severe Sepsis without septic shock secondary to COVID-19.    PT Comments    Pt requiring increased time due to dyspnea and internally distracted (tingling, "why can't I breathe?", chest hurts, mouth dry, nose stopped up; cycling through these).  Pt provided with cues for breathing and relaxation techniques.  Pt felt unable to stand or transfer to recliner today.  RN in/out of room during session and aware.    Follow Up Recommendations  SNF;LTACH (vs)     Equipment Recommendations  None recommended by PT    Recommendations for Other Services       Precautions / Restrictions Precautions Precautions: Fall Precaution Comments: monitor vitals, currently on 30L/ 85%FiO2 HFNC, NRB as needed for activity/recovery Required Braces or Orthoses: Other Brace Other Brace: wears L wrist brace d/t previous injury    Mobility  Bed Mobility Overal bed mobility: Needs Assistance Bed Mobility: Supine to Sit;Sit to Supine     Supine to sit: Min guard Sit to supine: Min guard   General bed mobility comments: patient able to utilize bed rails to assist with bed mobility    Transfers                 General transfer comment: pt declined today, feeling too SOB and "hot"  Ambulation/Gait                 Stairs             Wheelchair Mobility    Modified Rankin (Stroke Patients Only)       Balance                                             Cognition Arousal/Alertness: Awake/alert Behavior During Therapy: Anxious Overall Cognitive Status: Within Functional Limits for tasks assessed                                 General Comments: increased anxiety with mobility and dyspnea      Exercises General Exercises - Lower Extremity Ankle Circles/Pumps: AROM;Both;15 reps;Seated Long Arc Quad: AROM;Both;15 reps;Seated    General Comments General comments (skin integrity, edema, etc.): Pt on 30L/ 85%FiO2 HFNC, 15L partial NRB for activity and Spo2 dropped to 75% upon sitting EOB and then upon return to bed.  SPO2 mostly mid80s with sitting EOB and exercises.  Pt resting on HFNC only however requires partial NRB for activity at this time.      Pertinent Vitals/Pain Pain Assessment: 0-10 Pain Score: 6  Pain Location: left chest Pain Descriptors / Indicators: Discomfort Pain Intervention(s): Repositioned;Monitored during session (RN in room when pt c/o pain, pt working on theraband prior to arrival)    Home Living                      Prior Function  PT Goals (current goals can now be found in the care plan section) Progress towards PT goals: Progressing toward goals    Frequency           PT Plan Current plan remains appropriate    Co-evaluation              AM-PAC PT "6 Clicks" Mobility   Outcome Measure  Help needed turning from your back to your side while in a flat bed without using bedrails?: A Little Help needed moving from lying on your back to sitting on the side of a flat bed without using bedrails?: A Little Help needed moving to and from a bed to a chair (including a wheelchair)?: A Little Help needed standing up from a chair using your arms (e.g., wheelchair or bedside chair)?: A Lot Help needed to walk in hospital room?: A Lot Help needed climbing 3-5 steps with a railing? : Total 6 Click Score: 14    End of Session Equipment Utilized During  Treatment: Oxygen Activity Tolerance: Other (comment) (limited by dyspnea, ?anxiety) Patient left: with call bell/phone within reach;in bed Nurse Communication: Mobility status PT Visit Diagnosis: Difficulty in walking, not elsewhere classified (R26.2);Muscle weakness (generalized) (M62.81)     Time: 3300-7622 PT Time Calculation (min) (ACUTE ONLY): 25 min  Charges:  $Therapeutic Activity: 23-37 mins                     Jannette Spanner PT, DPT Acute Rehabilitation Services Pager: [phone] Office: [phone]  York Ram E 09/10/2020, 2:38 PM
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
## 2020-09-10 NOTE — Plan of Care (Signed)
  Problem: Education: Goal: Knowledge of risk factors and measures for prevention of condition will improve Outcome: Progressing   Problem: Safety: Goal: Ability to remain free from injury will improve Outcome: Progressing

## 2020-09-10 NOTE — Care Management Important Message (Signed)
Important Message  Patient Details IM Letter given to the Patient. Name: [name] MRN: [id number] Date of Birth: [birth year]   Medicare Important Message Given:  Yes     [name] 09/10/2020, 10:56 AM

## 2020-09-10 NOTE — Progress Notes (Signed)
Pt on BIPAP for hs use and tolerating well

## 2020-09-11 ENCOUNTER — Inpatient Hospital Stay (HOSPITAL_COMMUNITY): Payer: Medicare HMO

## 2020-09-11 DIAGNOSIS — E1169 Type 2 diabetes mellitus with other specified complication: Secondary | ICD-10-CM | POA: Diagnosis not present

## 2020-09-11 DIAGNOSIS — J9601 Acute respiratory failure with hypoxia: Secondary | ICD-10-CM | POA: Diagnosis not present

## 2020-09-11 DIAGNOSIS — R918 Other nonspecific abnormal finding of lung field: Secondary | ICD-10-CM | POA: Diagnosis not present

## 2020-09-11 DIAGNOSIS — U071 COVID-19: Secondary | ICD-10-CM | POA: Diagnosis not present

## 2020-09-11 DIAGNOSIS — T7401XA Adult neglect or abandonment, confirmed, initial encounter: Secondary | ICD-10-CM | POA: Diagnosis not present

## 2020-09-11 LAB — CBC WITH DIFFERENTIAL/PLATELET
Abs Immature Granulocytes: 0.51 10*3/uL — ABNORMAL HIGH (ref 0.00–0.07)
Basophils Absolute: 0.1 10*3/uL (ref 0.0–0.1)
Basophils Relative: 1 %
Eosinophils Absolute: 0.1 10*3/uL (ref 0.0–0.5)
Eosinophils Relative: 1 %
HCT: 43.8 % (ref 36.0–46.0)
Hemoglobin: 14.5 g/dL (ref 12.0–15.0)
Immature Granulocytes: 5 %
Lymphocytes Relative: 11 %
Lymphs Abs: 1.1 10*3/uL (ref 0.7–4.0)
MCH: 28.9 pg (ref 26.0–34.0)
MCHC: 33.1 g/dL (ref 30.0–36.0)
MCV: 87.3 fL (ref 80.0–100.0)
Monocytes Absolute: 0.4 10*3/uL (ref 0.1–1.0)
Monocytes Relative: 4 %
Neutro Abs: 8 10*3/uL — ABNORMAL HIGH (ref 1.7–7.7)
Neutrophils Relative %: 78 %
Platelets: 205 10*3/uL (ref 150–400)
RBC: 5.02 MIL/uL (ref 3.87–5.11)
RDW: 16.4 % — ABNORMAL HIGH (ref 11.5–15.5)
WBC: 10 10*3/uL (ref 4.0–10.5)
nRBC: 0.6 % — ABNORMAL HIGH (ref 0.0–0.2)

## 2020-09-11 LAB — COMPREHENSIVE METABOLIC PANEL WITH GFR
ALT: 30 U/L (ref 0–44)
AST: 29 U/L (ref 15–41)
Albumin: 3 g/dL — ABNORMAL LOW (ref 3.5–5.0)
Alkaline Phosphatase: 79 U/L (ref 38–126)
Anion gap: 12 (ref 5–15)
BUN: 25 mg/dL — ABNORMAL HIGH (ref 8–23)
CO2: 26 mmol/L (ref 22–32)
Calcium: 9.4 mg/dL (ref 8.9–10.3)
Chloride: 100 mmol/L (ref 98–111)
Creatinine, Ser: 0.84 mg/dL (ref 0.44–1.00)
GFR, Estimated: >60 mL/min
Glucose, Bld: 153 mg/dL — ABNORMAL HIGH (ref 70–99)
Potassium: 3.7 mmol/L (ref 3.5–5.1)
Sodium: 138 mmol/L (ref 135–145)
Total Bilirubin: 0.9 mg/dL (ref 0.3–1.2)
Total Protein: 5.8 g/dL — ABNORMAL LOW (ref 6.5–8.1)

## 2020-09-11 LAB — GLUCOSE, CAPILLARY
Glucose-Capillary: 97 mg/dL (ref 70–99)
Glucose-Capillary: 133 mg/dL — ABNORMAL HIGH (ref 70–99)
Glucose-Capillary: 172 mg/dL — ABNORMAL HIGH (ref 70–99)
Glucose-Capillary: 197 mg/dL — ABNORMAL HIGH (ref 70–99)

## 2020-09-11 LAB — LACTATE DEHYDROGENASE: LDH: 313 U/L — ABNORMAL HIGH (ref 98–192)

## 2020-09-11 LAB — BRAIN NATRIURETIC PEPTIDE: B Natriuretic Peptide: 69.6 pg/mL (ref 0.0–100.0)

## 2020-09-11 LAB — SEDIMENTATION RATE: Sed Rate: 13 mm/hr (ref 0–22)

## 2020-09-11 MED ORDER — LIP MEDEX EX OINT
TOPICAL_OINTMENT | CUTANEOUS | Status: DC | PRN
  Filled 2020-09-11 (×2): qty 7

## 2020-09-11 MED ORDER — CLINDAMYCIN PHOSPHATE 600 MG/50ML IV SOLN
600.0000 mg | Freq: Three times a day (TID) | INTRAVENOUS | Status: DC
  Administered 2020-09-11 – 2020-09-17 (×17): 600 mg via INTRAVENOUS
  Filled 2020-09-11 (×17): qty 50

## 2020-09-11 MED ORDER — PRIMAQUINE PHOSPHATE 26.3 (15 BASE) MG PO TABS
30.0000 mg | ORAL_TABLET | Freq: Every day | ORAL | Status: DC
  Administered 2020-09-11 – 2020-09-17 (×7): 30 mg via ORAL
  Filled 2020-09-11 (×7): qty 2
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
## 2020-09-11 NOTE — Progress Notes (Signed)
NAME:  [name], MRN:  [id number], DOB:  01/30/1951, LOS: 3 ADMISSION DATE:  07/31/2020, CONSULTATION DATE:  09/11/20 REFERRING MD:  Dr. Karleen Hampshire, CHIEF COMPLAINT:  Abnormal CT, hypoxic respiratory failure   Brief History:  70 y/o F admitted 1/28 with COVID PNA, unvaccinated.  She was treated with remdesivir, tocilizumab, and steroids.  Hospitalization complicated by staph hominis bacteremia (treated), prolonged hypoxic respiratory failure requiring high flow O2, currently on heated high flow 35L / 100%, LLE DVT now on Eliquis and thrush. Her steroid dosing started at 79m IV Q6 of methylprednisolone and has been tapered to 40 mg prednisone QD. Given lack of progression in weaning oxygen, HRCT was assessed on 3/8 which showed diffuse ground-glass bilaterally, with worsening peribronchovascular regions of consolidation and volume loss in the lower lobes with some developing bronchiectasis.  When compared to prior CT, bronchiectasis had progression in lower lobes.   PCCM called back 3/11 for evaluation.   Past Medical History:  NICM, chronic HFrEF > recovery of EF after ablation  Asthma Obesity  OSA - not on CPAP   Significant Hospital Events:  1/28 Admit  2/03 PCCM consulted  3/11 PCCM called back for evaluation of ongoing hypoxic respiratory failure, GGO on CT  Consults:    Procedures:    Significant Diagnostic Tests:   CTA PE 1/29 >> diffuse bilateral ground-glass opacities, compatible with diffuse pneumonia vs pulmonary edema, negative for central obstructing PE   HRCT Chest 3/8 >> diffuse ground-glass bilaterally, with worsening peribronchovascular regions of consolidation and volume loss in the lower lobes with some developing bronchiectasis. Given patients history of COVID infection, findings likely related to acute infection rather than ILD.  Aortic atherosclerosis, left main and 3 vessel CAD  Micro Data:    Antimicrobials:  Primaquine 3/11 >>  Clindamycin 3/11  >>  Interim History / Subjective:  Afebrile Remains on heated HFNC 35L flow / 100% Pt reports her mouth is very dry from the bipap and she has sores in her mouth from the dryness under her dentures, states she has had a difficult time swallowing in the am due to the dryness. She denies nose bleeding. Reports thin clear sputum production. States she has had chills. Denies coughing up blood.   Objective   Blood pressure 100/73, pulse 99, temperature 98.4 F (36.9 C), temperature source Oral, resp. rate (!) 24, height 5' 5"  (1.651 m), weight 84.5 kg, SpO2 94 %.    FiO2 (%):  [85 %-100 %] 100 %   Intake/Output Summary (Last 24 hours) at 09/11/2020 1436 Last data filed at 09/11/2020 1229 Gross per 24 hour  Intake 200 ml  Output 2375 ml  Net -2175 ml   Filed Weights   07/31/20 1305 09/10/20 0539  Weight: 99.8 kg 84.5 kg    Examination: General: ill appearing adult female lying in bed    HEENT: MM pink/dry, dentures in place, on removal noted upper palate tissue injry Neuro: AAOx4, speech clear, MAE CV: s1s2 RRR, no m/r/g PULM: non-labored at rest, mild dyspnea with prolonged conversation, clear anterior, bronchial breath sounds lower bilaterally GI: soft, bsx4 active  Extremities: warm/dry, no edema  Skin: no rashes or lesions  Resolved Hospital Problem list      Assessment & Plan:    Acute Hypoxic Respiratory Failure in setting of Bilateral Infiltrates, Bronchiectasis, s/p COVID  Bilateral infiltrates / GGO present since admission in the setting of COVID PNA.  Initial CT demonstrated mild lower lobe bronchiectasis with dense consolidation which has  progressed since initial CT.  She has been on high dose steroids for over one month which could predispose her to opportunistic infection. Other differential to consider - recurrent aspiration, post COVID pneumonitis.    -wean O2 for sats >85% -assess sputum for PJP  -allergies preclude PCN / Sulfa with anaphylaxis    -pulmonary  hygiene -IS, mobilize  -PPI -assess G6PD, ESR, LDH -begin empiric clinda / primaquin for possible PJP coverage given prolonged high dose steroids -ok for breaks on bipap, ensure good oral care -nasal hygiene  OSA  -adjust nocturnal bipap settings   Best practice (evaluated daily)  Diet: heart healthy Pain/Anxiety/Delirium protocol (if indicated): n/a  VAP protocol (if indicated): n/a  DVT prophylaxis: eliquis BID  GI prophylaxis: PPI Glucose control: SSI  Mobility: As tolerated  Disposition: per primary   Goals of Care:  Code Status: LCB - CPR ok, no intubation.   Labs   CBC: Recent Labs  Lab 09/06/20 0350 09/09/20 0356 09/11/20 1339  WBC 5.9 7.6 10.0  NEUTROABS  --   --  8.0*  HGB 13.0 13.5 14.5  HCT 38.7 41.4 43.8  MCV 86.4 87.7 87.3  PLT 152 179 921    Basic Metabolic Panel: Recent Labs  Lab 09/06/20 0350 09/07/20 0830 09/08/20 0847 09/09/20 0356  NA 137 138 139 136  K 2.8* 2.6* 3.8 3.7  CL 103 101 105 102  CO2 25 26 24 25   GLUCOSE 103* 102* 128* 103*  BUN 23 22 23 22   CREATININE 0.65 0.63 0.68 0.70  CALCIUM 8.8* 8.9 9.7 9.1  MG  --  1.8  --   --    GFR: Estimated Creatinine Clearance: 71.2 mL/min (by C-G formula based on SCr of 0.7 mg/dL). Recent Labs  Lab 09/06/20 0350 09/09/20 0356 09/11/20 1339  WBC 5.9 7.6 10.0    Liver Function Tests: No results for input(s): AST, ALT, ALKPHOS, BILITOT, PROT, ALBUMIN in the last 168 hours. No results for input(s): LIPASE, AMYLASE in the last 168 hours. No results for input(s): AMMONIA in the last 168 hours.  ABG    Component Value Date/Time   PHART 7.472 (H) 08/06/2020 0759   PCO2ART 30.8 (L) 08/06/2020 0759   PO2ART 92.3 08/06/2020 0759   HCO3 22.3 08/06/2020 0759   TCO2 23 04/30/2018 1358   ACIDBASEDEF 0.0 08/06/2020 0759   O2SAT 96.9 08/06/2020 0759     Coagulation Profile: No results for input(s): INR, PROTIME in the last 168 hours.  Cardiac Enzymes: No results for input(s): CKTOTAL,  CKMB, CKMBINDEX, TROPONINI in the last 168 hours.  HbA1C: Hgb A1c MFr Bld  Date/Time Value Ref Range Status  08/01/2020 05:03 AM 6.0 (H) 4.8 - 5.6 % Final    Comment:    (NOTE) Pre diabetes:          5.7%-6.4%  Diabetes:              >6.4%  Glycemic control for   <7.0% adults with diabetes   09/19/2014 06:47 PM 5.8 (H) 4.8 - 5.6 % Final    Comment:    (NOTE)         Pre-diabetes: 5.7 - 6.4         Diabetes: >6.4         Glycemic control for adults with diabetes: <7.0     CBG: Recent Labs  Lab 09/10/20 1352 09/10/20 1727 09/10/20 2148 09/11/20 0820 09/11/20 1222  GLUCAP 180* 198* 155* 97 133*      Critical  care time: n/a     Noe Gens, MSN, APRN, NP-C, AGACNP-BC [facility] 09/11/2020, 2:37 PM   Please see [email or web address] for pager details.   From 7A-7P if no response, please call [phone] After hours, please call ELink [phone]
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
## 2020-09-12 DIAGNOSIS — J9601 Acute respiratory failure with hypoxia: Secondary | ICD-10-CM | POA: Diagnosis not present

## 2020-09-12 DIAGNOSIS — E1169 Type 2 diabetes mellitus with other specified complication: Secondary | ICD-10-CM | POA: Diagnosis not present

## 2020-09-12 DIAGNOSIS — U071 COVID-19: Secondary | ICD-10-CM | POA: Diagnosis not present

## 2020-09-12 DIAGNOSIS — T7401XA Adult neglect or abandonment, confirmed, initial encounter: Secondary | ICD-10-CM | POA: Diagnosis not present

## 2020-09-12 LAB — GLUCOSE, CAPILLARY
Glucose-Capillary: 113 mg/dL — ABNORMAL HIGH (ref 70–99)
Glucose-Capillary: 160 mg/dL — ABNORMAL HIGH (ref 70–99)
Glucose-Capillary: 243 mg/dL — ABNORMAL HIGH (ref 70–99)
Glucose-Capillary: 219 mg/dL — ABNORMAL HIGH (ref 70–99)
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
## 2020-09-12 NOTE — Progress Notes (Signed)
RT NOTE:  Pt taken off BiPAP and placed on HHFNC. Pt tolerating this well with saturations of 93-94%. BiPAP on stand-by at pt bedside. Vitals are stable at this time, RT will continue to monitor.

## 2020-09-13 DIAGNOSIS — J9601 Acute respiratory failure with hypoxia: Secondary | ICD-10-CM | POA: Diagnosis not present

## 2020-09-13 DIAGNOSIS — U071 COVID-19: Secondary | ICD-10-CM | POA: Diagnosis not present

## 2020-09-13 DIAGNOSIS — E1169 Type 2 diabetes mellitus with other specified complication: Secondary | ICD-10-CM | POA: Diagnosis not present

## 2020-09-13 DIAGNOSIS — T7401XA Adult neglect or abandonment, confirmed, initial encounter: Secondary | ICD-10-CM | POA: Diagnosis not present

## 2020-09-13 LAB — GLUCOSE 6 PHOSPHATE DEHYDROGENASE
G6PDH: 9.2 U/g{Hb} (ref 4.8–15.7)
Hemoglobin: 14.3 g/dL (ref 11.1–15.9)

## 2020-09-13 LAB — GLUCOSE, CAPILLARY
Glucose-Capillary: 93 mg/dL (ref 70–99)
Glucose-Capillary: 125 mg/dL — ABNORMAL HIGH (ref 70–99)
Glucose-Capillary: 214 mg/dL — ABNORMAL HIGH (ref 70–99)
Glucose-Capillary: 216 mg/dL — ABNORMAL HIGH (ref 70–99)

## 2020-09-13 NOTE — Progress Notes (Addendum)
NAME:  [name], MRN:  [id number], DOB:  06-14-1951, LOS: 18 ADMISSION DATE:  07/31/2020, CONSULTATION DATE:  09/11/20 REFERRING MD:  Dr. Karleen Hampshire, CHIEF COMPLAINT:  Abnormal CT, hypoxic respiratory failure   Brief History:  70 y/o F admitted 1/28 with COVID PNA, unvaccinated.  She was treated with remdesivir, tocilizumab, and steroids.  Hospitalization complicated by staph hominis bacteremia (treated), prolonged hypoxic respiratory failure requiring high flow O2, currently on heated high flow 35L / 100%, LLE DVT now on Eliquis and thrush. Her steroid dosing started at 2m IV Q6 of methylprednisolone and has been tapered to 40 mg prednisone QD. Given lack of progression in weaning oxygen, HRCT was assessed on 3/8 which showed diffuse ground-glass bilaterally, with worsening peribronchovascular regions of consolidation and volume loss in the lower lobes with some developing bronchiectasis.  When compared to prior CT, bronchiectasis had progression in lower lobes.   PCCM called back 3/11 for evaluation.   Past Medical History:  NICM, chronic HFrEF > recovery of EF after ablation  Asthma Obesity  OSA - not on CPAP   Significant Hospital Events:  1/28 Admit  2/03 PCCM consulted  3/11 PCCM called back for evaluation of ongoing hypoxic respiratory failure, GGO on CT  Consults:  PCCM  Procedures:    Significant Diagnostic Tests:   CTA PE 1/29 >> diffuse bilateral ground-glass opacities, compatible with diffuse pneumonia vs pulmonary edema, negative for central obstructing PE   Echo 12/54GI diastolic dysfunction   HRCT Chest 3/8 >> diffuse ground-glass bilaterally, with worsening peribronchovascular regions of consolidation and volume loss in the lower lobes with some developing bronchiectasis. Given patients history of COVID infection, findings likely related to acute infection rather than ILD.  Aortic atherosclerosis, left main and 3 vessel CAD  MRI brain >>>  Micro Data:  Resp   3/5 > mixed org > resp flora  MRSA PCR  3/5 >  POS Sputum for PCP 3/13 >>>  Antimicrobials:  Primaquine 3/11 >>  Clindamycin 3/11 >>  Interim History / Subjective:  No real change, complains of vertigo "x years" but worse since covid so MRI ordered by [facility] slt pink tinged sputum on bedside table being sent for PCP studies this am   Objective   Blood pressure 126/85, pulse 73, temperature (!) 96.8 F (36 C), temperature source Axillary, resp. rate 20, height 5' 5"  (1.651 m), weight 84.5 kg, SpO2 97 %.    FiO2 (%):  [85 %-90 %] 85 %   Intake/Output Summary (Last 24 hours) at 09/13/2020 1005 Last data filed at 09/12/2020 1900 Gross per 24 hour  Intake 420 ml  Output 1500 ml  Net -1080 ml   Filed Weights   07/31/20 1305 09/10/20 0539  Weight: 99.8 kg 84.5 kg    Examination: Tmax  97.9  On bipap or hfnc sats consistently > 95% but @ very high 02 flow rates  Pt alert, approp nad @ 45 degrees  No jvd Oropharynx clear,  mucosa nl Neck supple Lungs with classic bronchovesicular changes both bases  RRR no s3 or or sign murmur Abd obese with nl excursion  Extr warm with no edema or clubbing noted Neuro  Sensorium intact,  no apparent motor deficits     Resolved Hospital Problem list      Assessment & Plan:    Acute Hypoxic Respiratory Failure in setting of Bilateral Infiltrates, Bronchiectasis, s/p COVID  Bilateral infiltrates / GGO present since admission in the setting of COVID PNA.  Initial CT demonstrated mild lower lobe bronchiectasis with dense consolidation which has progressed since initial CT.  She has been on high dose steroids for over one month which could predispose her to opportunistic infection. Other differential to consider - recurrent aspiration, post COVID pneumonitis.    -wean O2 for sats >85% :  She hasn't had a sat < 95% so far so will reiterate goals to RT  -assess sputum for PJP pending  -allergies preclude PCN / Sulfa with anaphylaxis     -pulmonary hygiene -IS, mobilize  -PPI -assess G6PD, ESR, LDH :  Nl x for LDL 313 c/w post covid, also c/w PCP though exam is not typical for this - see micro/ abx flowsheets above  -ok for breaks on bipap, ensure good oral care -nasal hygiene  OSA  -adjust nocturnal bipap settings   Best practice (evaluated daily)  Diet: heart healthy Pain/Anxiety/Delirium protocol (if indicated): n/a  VAP protocol (if indicated): n/a  DVT prophylaxis: eliquis BID  GI prophylaxis: PPI Glucose control: SSI  Mobility: As tolerated  Disposition: per primary   Goals of Care:  Code Status: LCB - CPR ok, no intubation.   Labs   CBC: Recent Labs  Lab 09/09/20 0356 09/11/20 1339 09/11/20 1650  WBC 7.6 10.0  --   NEUTROABS  --  8.0*  --   HGB 13.5 14.5 14.3  HCT 41.4 43.8  --   MCV 87.7 87.3  --   PLT 179 205  --     Basic Metabolic Panel: Recent Labs  Lab 09/07/20 0830 09/08/20 0847 09/09/20 0356 09/11/20 1339  NA 138 139 136 138  K 2.6* 3.8 3.7 3.7  CL 101 105 102 100  CO2 26 24 25 26   GLUCOSE 102* 128* 103* 153*  BUN 22 23 22  25*  CREATININE 0.63 0.68 0.70 0.84  CALCIUM 8.9 9.7 9.1 9.4  MG 1.8  --   --   --    GFR: Estimated Creatinine Clearance: 67.9 mL/min (by C-G formula based on SCr of 0.84 mg/dL). Recent Labs  Lab 09/09/20 0356 09/11/20 1339  WBC 7.6 10.0    Liver Function Tests: Recent Labs  Lab 09/11/20 1339  AST 29  ALT 30  ALKPHOS 79  BILITOT 0.9  PROT 5.8*  ALBUMIN 3.0*   No results for input(s): LIPASE, AMYLASE in the last 168 hours. No results for input(s): AMMONIA in the last 168 hours.  ABG    Component Value Date/Time   PHART 7.472 (H) 08/06/2020 0759   PCO2ART 30.8 (L) 08/06/2020 0759   PO2ART 92.3 08/06/2020 0759   HCO3 22.3 08/06/2020 0759   TCO2 23 04/30/2018 1358   ACIDBASEDEF 0.0 08/06/2020 0759   O2SAT 96.9 08/06/2020 0759     Coagulation Profile: No results for input(s): INR, PROTIME in the last 168 hours.  Cardiac  Enzymes: No results for input(s): CKTOTAL, CKMB, CKMBINDEX, TROPONINI in the last 168 hours.  HbA1C: Hgb A1c MFr Bld  Date/Time Value Ref Range Status  08/01/2020 05:03 AM 6.0 (H) 4.8 - 5.6 % Final    Comment:    (NOTE) Pre diabetes:          5.7%-6.4%  Diabetes:              >6.4%  Glycemic control for   <7.0% adults with diabetes   09/19/2014 06:47 PM 5.8 (H) 4.8 - 5.6 % Final    Comment:    (NOTE)         Pre-diabetes:  5.7 - 6.4         Diabetes: >6.4         Glycemic control for adults with diabetes: <7.0     CBG: Recent Labs  Lab 09/12/20 0741 09/12/20 1125 09/12/20 1624 09/12/20 2054 09/13/20 0806  GLUCAP 113* 160* 243* 219* 93      Christinia Gully, MD [facility] [phone]   After 7:00 pm call Elink  [phone]
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
## 2020-09-14 DIAGNOSIS — U071 COVID-19: Secondary | ICD-10-CM | POA: Diagnosis not present

## 2020-09-14 DIAGNOSIS — J9601 Acute respiratory failure with hypoxia: Secondary | ICD-10-CM | POA: Diagnosis not present

## 2020-09-14 DIAGNOSIS — T7401XA Adult neglect or abandonment, confirmed, initial encounter: Secondary | ICD-10-CM | POA: Diagnosis not present

## 2020-09-14 DIAGNOSIS — E1169 Type 2 diabetes mellitus with other specified complication: Secondary | ICD-10-CM | POA: Diagnosis not present

## 2020-09-14 LAB — CBC
HCT: 41.7 % (ref 36.0–46.0)
Hemoglobin: 13.7 g/dL (ref 12.0–15.0)
MCH: 28.5 pg (ref 26.0–34.0)
MCHC: 32.9 g/dL (ref 30.0–36.0)
MCV: 86.9 fL (ref 80.0–100.0)
Platelets: 202 10*3/uL (ref 150–400)
RBC: 4.8 MIL/uL (ref 3.87–5.11)
RDW: 16.4 % — ABNORMAL HIGH (ref 11.5–15.5)
WBC: 9.7 10*3/uL (ref 4.0–10.5)
nRBC: 0.5 % — ABNORMAL HIGH (ref 0.0–0.2)

## 2020-09-14 LAB — BASIC METABOLIC PANEL
Anion gap: 10 (ref 5–15)
BUN: 22 mg/dL (ref 8–23)
CO2: 24 mmol/L (ref 22–32)
Calcium: 9.5 mg/dL (ref 8.9–10.3)
Chloride: 104 mmol/L (ref 98–111)
Creatinine, Ser: 0.78 mg/dL (ref 0.44–1.00)
GFR, Estimated: >60 mL/min (ref >60)
Glucose, Bld: 115 mg/dL — ABNORMAL HIGH (ref 70–99)
Potassium: 3.4 mmol/L — ABNORMAL LOW (ref 3.5–5.1)
Sodium: 138 mmol/L (ref 135–145)

## 2020-09-14 LAB — GLUCOSE, CAPILLARY
Glucose-Capillary: 99 mg/dL (ref 70–99)
Glucose-Capillary: 122 mg/dL — ABNORMAL HIGH (ref 70–99)
Glucose-Capillary: 160 mg/dL — ABNORMAL HIGH (ref 70–99)
Glucose-Capillary: 166 mg/dL — ABNORMAL HIGH (ref 70–99)

## 2020-09-14 LAB — PNEUMOCYSTIS JIROVECI SMEAR BY DFA: Pneumocystis jiroveci Ag: NEGATIVE

## 2020-09-14 MED ORDER — POTASSIUM CHLORIDE CRYS ER 20 MEQ PO TBCR
40.0000 meq | EXTENDED_RELEASE_TABLET | Freq: Once | ORAL | Status: AC
  Administered 2020-09-14: 40 meq via ORAL
  Filled 2020-09-14: qty 2

## 2020-09-14 MED ORDER — ALUM & MAG HYDROXIDE-SIMETH 200-200-20 MG/5ML PO SUSP: 15.0000 mL | Freq: Four times a day (QID) | ORAL | Status: DC | PRN

## 2020-09-14 MED ORDER — ONDANSETRON HCL 4 MG/2ML IJ SOLN
4.0000 mg | Freq: Four times a day (QID) | INTRAMUSCULAR | Status: DC | PRN
  Administered 2020-09-22: 4 mg via INTRAVENOUS
  Filled 2020-09-14: qty 2

## 2020-09-14 MED ORDER — FAMOTIDINE 20 MG PO TABS
20.0000 mg | ORAL_TABLET | Freq: Every day | ORAL | Status: DC
  Administered 2020-09-14 – 2020-09-23 (×10): 20 mg via ORAL
  Filled 2020-09-14 (×10): qty 1

## 2020-09-14 NOTE — Progress Notes (Signed)
PT Cancellation Note  Patient Details Name: ARYIANA KLINKNER MRN: [id number] DOB: 1951/04/07   Cancelled Treatment:    Reason Eval/Treat Not Completed: Medical issues which prohibited therapy. This AM patient reports feeling dizzy, nausea, coughing up sputum. Will check back another time.   Claretha Cooper 09/14/2020, 1:37 PM  [place] Pager [phone] Office [phone]
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
## 2020-09-15 DIAGNOSIS — U071 COVID-19: Secondary | ICD-10-CM | POA: Diagnosis not present

## 2020-09-15 DIAGNOSIS — E1169 Type 2 diabetes mellitus with other specified complication: Secondary | ICD-10-CM | POA: Diagnosis not present

## 2020-09-15 DIAGNOSIS — J9601 Acute respiratory failure with hypoxia: Secondary | ICD-10-CM | POA: Diagnosis not present

## 2020-09-15 DIAGNOSIS — T7401XA Adult neglect or abandonment, confirmed, initial encounter: Secondary | ICD-10-CM | POA: Diagnosis not present

## 2020-09-15 LAB — GLUCOSE, CAPILLARY
Glucose-Capillary: 94 mg/dL (ref 70–99)
Glucose-Capillary: 219 mg/dL — ABNORMAL HIGH (ref 70–99)
Glucose-Capillary: 175 mg/dL — ABNORMAL HIGH (ref 70–99)

## 2020-09-15 MED ORDER — ZINC OXIDE 20 % EX OINT
TOPICAL_OINTMENT | CUTANEOUS | Status: DC | PRN
  Administered 2020-09-15: 1 via TOPICAL
  Filled 2020-09-15 (×2): qty 28.35

## 2020-09-15 NOTE — Plan of Care (Signed)
  Problem: Education: Goal: Knowledge of risk factors and measures for prevention of condition will improve Outcome: Progressing   Problem: Coping: Goal: Psychosocial and spiritual needs will be supported Outcome: Progressing   Problem: Respiratory: Goal: Will maintain a patent airway Outcome: Progressing   Problem: Clinical Measurements: Goal: Cardiovascular complication will be avoided Outcome: Progressing   Problem: Nutrition: Goal: Adequate nutrition will be maintained Outcome: Progressing   Problem: Safety: Goal: Ability to remain free from injury will improve Outcome: Progressing
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
## 2020-09-16 DIAGNOSIS — J9601 Acute respiratory failure with hypoxia: Secondary | ICD-10-CM | POA: Diagnosis not present

## 2020-09-16 DIAGNOSIS — U071 COVID-19: Secondary | ICD-10-CM | POA: Diagnosis not present

## 2020-09-16 LAB — GLUCOSE, CAPILLARY
Glucose-Capillary: 100 mg/dL — ABNORMAL HIGH (ref 70–99)
Glucose-Capillary: 196 mg/dL — ABNORMAL HIGH (ref 70–99)
Glucose-Capillary: 207 mg/dL — ABNORMAL HIGH (ref 70–99)
Glucose-Capillary: 198 mg/dL — ABNORMAL HIGH (ref 70–99)

## 2020-09-16 LAB — PNEUMOCYSTIS SMEAR, DFA: Pneumocystis Smear, DFA: NEGATIVE

## 2020-09-16 NOTE — Plan of Care (Signed)
  Problem: Respiratory: Goal: Will maintain a patent airway Outcome: Progressing   Problem: Clinical Measurements: Goal: Respiratory complications will improve Outcome: Progressing   Problem: Activity: Goal: Risk for activity intolerance will decrease Outcome: Progressing   Problem: Pain Managment: Goal: General experience of comfort will improve Outcome: Progressing   Problem: Safety: Goal: Ability to remain free from injury will improve Outcome: Progressing   Problem: Skin Integrity: Goal: Risk for impaired skin integrity will decrease Outcome: Progressing
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
## 2020-09-16 NOTE — TOC Progression Note (Signed)
Transition of Care [facility]) - Progression Note    Patient Details  Name: [name] MRN: [id number] Date of Birth: 08-03-1950  Transition of Care [facility]) CM/SW Contact  Sherrilyn Rist Transition of Care Supervisor Phone Number: [phone] 09/16/2020, 2:50 PM   Clinical Narrative:    Patient with Acute hypoxemic respiratory failure due to COVID-19 [facility]) prolonged stay due to high flow FiO2 requirements. DCP - Select  LTAC when FiO2 improves. TOC will continue to follow for progression of care.    Expected Discharge Plan: [place] Barriers to Discharge: No Barriers Identified  Expected Discharge Plan and Services Expected Discharge Plan: [place] In-house Referral: Clinical Social Work     Living arrangements for the past 2 months: Single Family Home                                       Social Determinants of Health (SDOH) Interventions    Readmission Risk Interventions No flowsheet data found.

## 2020-09-16 NOTE — Progress Notes (Signed)
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 on 07/18/2018, hypertension who presented to the ED with 2 days of fever, cough, dyspnea. Pt admitted for  Acute hypoxic respiratory failure and Severe Sepsis without septic shock secondary to COVID-19.   OT comments  Treatment focused on promoting functional mobility and activity out of the bed. Patient on 35 L HHFNC and 85% Fio2 with beginning at sat 96% at rest. Increased time for encouragement and education to get patient to agree to attempt OOB activity. With doning socks in bed patient's o2 sat dropped to 88%. NRB placed in preparation for activity. Patient min guard to transfer to side of bed with use of bed rails. Mild complaints of dizziness at edge of bed. Patient min assist x 2 to stand from low bed height and 2 steps to transfer to recliner. Patient reports feeling a little short of breath. O2 sat dropped to 83% but recovered back to 90% within 5 minutes. Patient reporting dizziness and needing to catch her breath but dyspnea mild compared to prior treatments. Patient exhibiting improved activity tolerance today. Encouraged patient to use breathing devices while sitting up and performing diaphragmatic breathing. Cont POC.   Follow Up Recommendations  LTACH    Equipment Recommendations  None recommended by OT    Recommendations for Other Services      Precautions / Restrictions Precautions Precautions: Fall Precaution Comments: monitor vitals, currently on 30L/ 85%FiO2 HFNC, NRB as needed for activity/recovery; reports she can't feel her feet sometimes and makes her scared to stand Other Brace: wears L wrist  brace d/t previous injury, suppossed to  not bear weight on wrist, cannot wear brace with IV Restrictions Weight Bearing Restrictions: No LUE Weight Bearing: Weight bearing as tolerated Other Position/Activity Restrictions: Hx of vertigo and tinnitis (gets dizzy with movement) premedicate, don't open the blinds (the height makes her dizzy)       Mobility Bed Mobility Overal bed mobility: Needs Assistance Bed Mobility: Supine to Sit     Supine to sit: HOB elevated;Min guard     General bed mobility comments: able to transfer to sitting at side of bed with use of bed rails, increased time. min guard for safety due to hx of dizziness with movement    Transfers Overall transfer level: Needs assistance Equipment used: 2 person hand held assist Transfers: Sit to/from [facility] Sit to Stand: Min assist;+2 safety/equipment Stand pivot transfers: Min assist;+2 safety/equipment       General transfer comment: To hand hold (under arms) to stand and take two steps to recliner.    Balance Overall balance assessment: Needs assistance Sitting-balance support: Feet supported Sitting balance-Leahy Scale: Fair     Standing balance support: Bilateral upper extremity supported Standing balance-Leahy Scale: Poor                             ADL either performed or assessed with clinical judgement   ADL   Eating/Feeding: Independent Eating/Feeding Details (indicate cue type and reason): after being positioned in recliner patient able to manage food tray independently.  Lower Body Dressing: Set up;Bed level Lower Body Dressing Details (indicate cue type and reason): able to don socks in supine.                     Vision Patient Visual Report: No change from baseline     Perception     Praxis      Cognition Arousal/Alertness: Awake/alert Behavior During Therapy: WFL for tasks assessed/performed Overall Cognitive Status:  Within Functional Limits for tasks assessed                                          Exercises     Shoulder Instructions       General Comments      Pertinent Vitals/ Pain       Pain Assessment: Faces Faces Pain Scale: Hurts a little bit Pain Location: right buttock Pain Descriptors / Indicators: Discomfort Pain Intervention(s): Monitored during session  Home Living                                          Prior Functioning/Environment              Frequency  Min 2X/week        Progress Toward Goals  OT Goals(current goals can now be found in the care plan section)  Progress towards OT goals: Progressing toward goals  Acute Rehab OT Goals Patient Stated Goal: to get better and go home OT Goal Formulation: With patient Time For Goal Achievement: 09/23/20 Potential to Achieve Goals: [name]    Co-evaluation          OT goals addressed during session:  (activity tolerance, functional mobility)      AM-PAC OT "6 Clicks" Daily Activity     Outcome Measure   Help from another person eating meals?: None Help from another person taking care of personal grooming?: A Little Help from another person toileting, which includes using toliet, bedpan, or urinal?: A Lot Help from another person bathing (including washing, rinsing, drying)?: A Lot Help from another person to put on and taking off regular upper body clothing?: A Lot Help from another person to put on and taking off regular lower body clothing?: A Lot 6 Click Score: 15    End of Session Equipment Utilized During Treatment: Oxygen  OT Visit Diagnosis: Other abnormalities of gait and mobility (R26.89);Muscle weakness (generalized) (M62.81)   Activity Tolerance Patient tolerated treatment well   Patient Left in chair;with call bell/phone within reach;with chair alarm set   Nurse Communication Mobility status         Time: 2355-7322 OT Time Calculation (min): 25 min  Charges: OT General Charges $OT Visit: 1 Visit OT Treatments $Therapeutic Activity: 23-37 mins  Vashon Arch, OTR/L [place]  Office [phone] Pager: [place] 09/16/2020, 9:59 AM

## 2020-09-16 NOTE — TOC Progression Note (Signed)
Transition of Care [facility]) - Progression Note    Patient Details  Name: [name] MRN: [id number] Date of Birth: 1951/01/19  Transition of Care [facility]) CM/SW Contact  Purcell Mouton, RN Phone Number: 09/16/2020, 2:57 PM  Clinical Narrative:    Pt continues to be on FiO2 at 75% and 35L O2. Not able to go to SNF or LTAC with these levels.    Expected Discharge Plan: [place] Barriers to Discharge: No Barriers Identified  Expected Discharge Plan and Services Expected Discharge Plan: [place] In-house Referral: Clinical Social Work     Living arrangements for the past 2 months: Single Family Home                                       Social Determinants of Health (SDOH) Interventions    Readmission Risk Interventions No flowsheet data found.

## 2020-09-17 DIAGNOSIS — U071 COVID-19: Secondary | ICD-10-CM | POA: Diagnosis not present

## 2020-09-17 DIAGNOSIS — J9601 Acute respiratory failure with hypoxia: Secondary | ICD-10-CM | POA: Diagnosis not present

## 2020-09-17 LAB — BASIC METABOLIC PANEL
Anion gap: 8 (ref 5–15)
BUN: 20 mg/dL (ref 8–23)
CO2: 25 mmol/L (ref 22–32)
Calcium: 9.3 mg/dL (ref 8.9–10.3)
Chloride: 103 mmol/L (ref 98–111)
Creatinine, Ser: 0.71 mg/dL (ref 0.44–1.00)
GFR, Estimated: >60 mL/min (ref >60)
Glucose, Bld: 109 mg/dL — ABNORMAL HIGH (ref 70–99)
Potassium: 3.6 mmol/L (ref 3.5–5.1)
Sodium: 136 mmol/L (ref 135–145)

## 2020-09-17 LAB — CBC
HCT: 41.1 % (ref 36.0–46.0)
Hemoglobin: 13.4 g/dL (ref 12.0–15.0)
MCH: 28.5 pg (ref 26.0–34.0)
MCHC: 32.6 g/dL (ref 30.0–36.0)
MCV: 87.3 fL (ref 80.0–100.0)
Platelets: 192 10*3/uL (ref 150–400)
RBC: 4.71 MIL/uL (ref 3.87–5.11)
RDW: 16.4 % — ABNORMAL HIGH (ref 11.5–15.5)
WBC: 9.5 10*3/uL (ref 4.0–10.5)
nRBC: 0.4 % — ABNORMAL HIGH (ref 0.0–0.2)

## 2020-09-17 LAB — GLUCOSE, CAPILLARY
Glucose-Capillary: 104 mg/dL — ABNORMAL HIGH (ref 70–99)
Glucose-Capillary: 158 mg/dL — ABNORMAL HIGH (ref 70–99)
Glucose-Capillary: 260 mg/dL — ABNORMAL HIGH (ref 70–99)
Glucose-Capillary: 155 mg/dL — ABNORMAL HIGH (ref 70–99)

## 2020-09-17 LAB — C-REACTIVE PROTEIN: CRP: <0.5 mg/dL (ref <1.0)

## 2020-09-17 MED ORDER — PREDNISONE 20 MG PO TABS
20.0000 mg | ORAL_TABLET | Freq: Every day | ORAL | Status: DC
  Administered 2020-09-18 – 2020-09-23 (×6): 20 mg via ORAL
  Filled 2020-09-17 (×6): qty 1

## 2020-09-17 MED ORDER — ATOVAQUONE 750 MG/5ML PO SUSP
1500.0000 mg | Freq: Every day | ORAL | Status: DC
  Administered 2020-09-18 – 2020-09-23 (×6): 1500 mg via ORAL
  Filled 2020-09-17 (×7): qty 10

## 2020-09-17 NOTE — TOC Progression Note (Signed)
Transition of Care [facility]) - Progression Note    Patient Details  Name: [name] MRN: [id number] Date of Birth: Aug 27, 1950  Transition of Care [name]) CM/SW Contact  Purcell Mouton, RN Phone Number: 09/17/2020, 2:40 PM  Clinical Narrative:    Pt back up to 85% FiO2 30 L O2.  No LTAC or SNF will take pt.    Expected Discharge Plan: [place] Barriers to Discharge: No Barriers Identified  Expected Discharge Plan and Services Expected Discharge Plan: [place] In-house Referral: Clinical Social Work     Living arrangements for the past 2 months: Single Family Home                                       Social Determinants of Health (SDOH) Interventions    Readmission Risk Interventions No flowsheet data found.
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
## 2020-09-17 NOTE — Progress Notes (Signed)
Physical Therapy Treatment Patient Details Name: [name] MRN: [id number] DOB: 1950/07/13 Today's Date: 09/17/2020    History of Present Illness Pt is 70 year old female who has not been vaccinated against COVID-19 with a past medical history of OSA not on CPAP, NICM, chronic systolic heart failure, high PVC burden s/p radiofrequency catheter ablation with [name] on 07/18/2018, hypertension who presented to the ED with 2 days of fever, cough, dyspnea. Pt admitted for  Acute hypoxic respiratory failure and Severe Sepsis without septic shock secondary to COVID-19.    PT Comments    The patient pariticipated in  therapay for ~  45 minutes, focussed on sitting, stood x 1 at platform RW . SPO2 on 30L/80% resting 94%, varied during activity with drop to 72 5 briefly after standing x 1.. Patient returned to supine with SPO2 returning to8 4% within ~ 4 minutes. Gradual climb back to > 90% with resting. Patient not requiring NRB for recovery today. Patient does appear less anxious and less dyspneic during activity.  Continue PT as frequently as possible to encourage increased activity. Patient reports using TB for exrercises through the day and sitting up in bed.  Follow Up Recommendations  CIR     Equipment Recommendations  None recommended by PT    Recommendations for Other Services       Precautions / Restrictions Precautions Precautions: Fall Precaution Comments: monitor vitals, currently on 30L/ 80%FiO2 HFNC, not using NRB now. Other Brace: wears L wrist brace d/t previous injury, suppossed to  not bear weight on wrist, cannot wear brace with IV    Mobility  Bed Mobility   Bed Mobility: Sit to Supine;Rolling;Sidelying to Sit Rolling: Supervision Sidelying to sit: Supervision;HOB elevated Supine to sit: HOB elevated Sit to supine: Min guard   General bed mobility comments: able to transfer to sitting at side of bed with use of bed rails, increased time. min guard for  safety due to hx of dizziness with movement, able to return to supine supervision- reports increased dizziness and needs to have BM. patient rolled to left  x 2. Able towipe self while in sidelying.    Transfers Overall transfer level: Needs assistance Equipment used: Bilateral platform walker (EVA) Transfers: Sit to/from Stand Sit to Stand: Min assist;+2 safety/equipment         General transfer comment: Bed raised , stood x 1  at EVA for ~ 1 minute. Patient supporting on the [place] with UE's  Ambulation/Gait                 Stairs             Wheelchair Mobility    Modified Rankin (Stroke Patients Only)       Balance Overall balance assessment: Needs assistance Sitting-balance support: Feet supported Sitting balance-Leahy Scale: Fair Sitting balance - Comments: sat x 10 minutes on bed edge   Standing balance support: Bilateral upper extremity supported Standing balance-Leahy Scale: Poor Standing balance comment: reliant on UE on EVA                            Cognition Arousal/Alertness: Awake/alert Behavior During Therapy: WFL for tasks assessed/performed Overall Cognitive Status: Within Functional Limits for tasks assessed                                 General Comments: initially patient states that the  meds she recently took weremaking her sleepy and dizzy. with encouragement patient did procede with participating in mobility.      Exercises      General Comments        Pertinent Vitals/Pain Pain Assessment: Faces Faces Pain Scale: Hurts little more Pain Location: abdomen, Pain Descriptors / Indicators: Cramping Pain Intervention(s): Monitored during session (placed on Bedpan)    Home Living                      Prior Function            PT Goals (current goals can now be found in the care plan section) Progress towards PT goals: Progressing toward goals    Frequency    Min 3X/week      PT  Plan Current plan remains appropriate;Frequency needs to be updated    Co-evaluation              AM-PAC PT "6 Clicks" Mobility   Outcome Measure  Help needed turning from your back to your side while in a flat bed without using bedrails?: A Little Help needed moving from lying on your back to sitting on the side of a flat bed without using bedrails?: A Little Help needed moving to and from a bed to a chair (including a wheelchair)?: A Little Help needed standing up from a chair using your arms (e.g., wheelchair or bedside chair)?: A Little Help needed to walk in hospital room?: A Lot Help needed climbing 3-5 steps with a railing? : Total 6 Click Score: 15    End of Session Equipment Utilized During Treatment: Oxygen Activity Tolerance: Patient tolerated treatment well Patient left: in bed;with call bell/phone within reach;with bed alarm set Nurse Communication: Mobility status PT Visit Diagnosis: Difficulty in walking, not elsewhere classified (R26.2);Muscle weakness (generalized) (M62.81)     Time: 2426-8341 PT Time Calculation (min) (ACUTE ONLY): 59 min  Charges:  $Therapeutic Activity: 38-52 mins $Self Care/Home Management: [name] Pager [phone] Office [phone]    Claretha Cooper 09/17/2020, 3:55 PM

## 2020-09-18 DIAGNOSIS — J9601 Acute respiratory failure with hypoxia: Secondary | ICD-10-CM | POA: Diagnosis not present

## 2020-09-18 DIAGNOSIS — U071 COVID-19: Secondary | ICD-10-CM | POA: Diagnosis not present

## 2020-09-18 LAB — GLUCOSE, CAPILLARY
Glucose-Capillary: 93 mg/dL (ref 70–99)
Glucose-Capillary: 185 mg/dL — ABNORMAL HIGH (ref 70–99)
Glucose-Capillary: 221 mg/dL — ABNORMAL HIGH (ref 70–99)
Glucose-Capillary: 130 mg/dL — ABNORMAL HIGH (ref 70–99)

## 2020-09-18 MED ORDER — SODIUM CHLORIDE 3 % IN NEBU
4.0000 mL | INHALATION_SOLUTION | Freq: Two times a day (BID) | RESPIRATORY_TRACT | Status: AC
  Administered 2020-09-18 – 2020-09-20 (×5): 4 mL via RESPIRATORY_TRACT
  Filled 2020-09-18 (×5): qty 4

## 2020-09-18 NOTE — Progress Notes (Signed)
NAME:  [name], MRN:  [id number], DOB:  10-29-1950, LOS: 53 ADMISSION DATE:  07/31/2020, CONSULTATION DATE:  09/11/20 REFERRING MD:  Dr. Karleen Hampshire, CHIEF COMPLAINT:  Abnormal CT, hypoxic respiratory failure   Brief History:  70 y/o F admitted 1/28 with COVID PNA, unvaccinated.  She was treated with remdesivir, tocilizumab, and steroids.  Hospitalization complicated by staph hominis bacteremia (treated), prolonged hypoxic respiratory failure requiring high flow O2, currently on heated high flow 35L / 100%, LLE DVT now on Eliquis and thrush. Her steroid dosing started at 60mg  IV Q6 of methylprednisolone and has been tapered to 40 mg prednisone QD. Given lack of progression in weaning oxygen, HRCT was assessed on 3/8 which showed diffuse ground-glass bilaterally, with worsening peribronchovascular regions of consolidation and volume loss in the lower lobes with some developing bronchiectasis.  When compared to prior CT, bronchiectasis had progression in lower lobes.   PCCM called back 3/11 for evaluation.   Past Medical History:  NICM, chronic HFrEF > recovery of EF after ablation  Asthma Obesity  OSA - not on CPAP   Significant Hospital Events:  1/28 Admit  2/03 PCCM consulted  3/11 PCCM called back for evaluation of ongoing hypoxic respiratory failure, GGO on CT  Consults:  PCCM  Procedures:    Significant Diagnostic Tests:   CTA PE 1/29 >> diffuse bilateral ground-glass opacities, compatible with diffuse pneumonia vs pulmonary edema, negative for central obstructing PE   Echo 2/11 GI diastolic dysfunction   HRCT Chest 3/8 >> diffuse ground-glass bilaterally, with worsening peribronchovascular regions of consolidation and volume loss in the lower lobes with some developing bronchiectasis. Given patients history of COVID infection, findings likely related to acute infection rather than ILD.  Aortic atherosclerosis, left main and 3 vessel CAD  MRI brain >>>  Micro Data:  Resp   3/5 > mixed org > resp flora  MRSA PCR  3/5 >  POS Sputum for PCP 3/13 >>>  Antimicrobials:  Primaquine 3/11 >>  Clindamycin 3/11 >>  Interim History / Subjective:  Gets dizzy with exertion  Objective   Blood pressure 118/82, pulse (Abnormal) 101, temperature 97.7 F (36.5 C), temperature source Axillary, resp. rate 18, height 5\' 5"  (1.651 m), weight 84.5 kg, SpO2 (Abnormal) 89 %.    FiO2 (%):  [80 %] 80 %   Intake/Output Summary (Last 24 hours) at 09/18/2020 1010 Last data filed at 09/18/2020 9417 Gross per 24 hour  Intake no documentation  Output 2401 ml  Net -2401 ml   Filed Weights   07/31/20 1305 09/10/20 0539  Weight: 99.8 kg 84.5 kg    Examination:  General 70 year old white female who remains on high flow oxygen HEENT normocephalic atraumatic no jugular venous distention Pulmonary: Basilar rales no accessory use currently 30 L flow rate, 70% FiO2 desaturates with exertion Cardiac regular rate and rhythm Abdomen soft nontender Extremities warm dry Neuro intact  Resolved Hospital Problem list      Assessment & Plan:    Acute Hypoxic Respiratory Failure in setting of Bilateral Infiltrates, Bronchiectasis, s/p COVID  Bilateral infiltrates / GGO present since admission in the setting of COVID PNA.  Initial CT demonstrated mild lower lobe bronchiectasis with dense consolidation which has progressed since initial CT.  She has been on high dose steroids for over one month which could predispose her to opportunistic infection but all PJP neg. Other differential to consider - recurrent aspiration, post COVID pneumonitis.   Plan Cont oxygen Cont PCP prophylaxis IS  Supportive  care Wonder if she would be LTAC candidate  OSA  Plan Cont pulse ox  BIPAP   Best practice (evaluated daily)  Per primary   Erick Colace ACNP-BC [place] Pager # [phone] OR # [phone] if no answer

## 2020-09-18 NOTE — TOC Progression Note (Signed)
Transition of Care [facility]) - Progression Note    Patient Details  Name: [name] MRN: [id number] Date of Birth: 1950/11/17  Transition of Care [facility]) CM/SW Contact  Purcell Mouton, RN Phone Number: 09/18/2020, 3:54 PM  Clinical Narrative:    Spoke with [facility] who states to have a safe transport for pt she will need to be at 40L and FiO2 65%. [facility] is following pt everyday.    Expected Discharge Plan: [place] Barriers to Discharge: No Barriers Identified  Expected Discharge Plan and Services Expected Discharge Plan: [place] In-house Referral: Clinical Social Work     Living arrangements for the past 2 months: Single Family Home                                       Social Determinants of Health (SDOH) Interventions    Readmission Risk Interventions No flowsheet data found.
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
## 2020-09-18 NOTE — Plan of Care (Signed)
  Problem: Respiratory: Goal: Will maintain a patent airway Outcome: Progressing   Problem: Clinical Measurements: Goal: Ability to maintain clinical measurements within normal limits will improve Outcome: Progressing Goal: Respiratory complications will improve Outcome: Progressing   Problem: Activity: Goal: Risk for activity intolerance will decrease Outcome: Progressing   Problem: Pain Managment: Goal: General experience of comfort will improve Outcome: Progressing   Problem: Safety: Goal: Ability to remain free from injury will improve Outcome: Progressing   Problem: Skin Integrity: Goal: Risk for impaired skin integrity will decrease Outcome: Progressing

## 2020-09-18 NOTE — Progress Notes (Signed)
Physical Therapy Treatment Patient Details Name: [name] MRN: [id number] DOB: May 23, 1951 Today's Date: 09/18/2020    History of Present Illness Pt is 70 year old female who has not been vaccinated against COVID-19 with a past medical history of OSA not on CPAP, NICM, chronic systolic heart failure, high PVC burden s/p radiofrequency catheter ablation with [name] on 07/18/2018, hypertension who presented to the ED with 2 days of fever, cough, dyspnea. Pt admitted for  Acute hypoxic respiratory failure and Severe Sepsis without septic shock secondary to COVID-19.    PT Comments    Pt on 30L (80% Fi02) HFNC at start of session. 15L NRB donned following supine to sit transfer as pt had difficulty recovering with 02 resting in low 80's while EOB. Pt performed sit to stand transfer with EVA walker and MIN assist +2 for safety and was able to ambulate ~33ft forward and backward with Min assist +2 for safety. Pt's max HR during session was 134bpm and 02 sat low at 76% following ambulation with recovery to low-mid 80's. NRB removed once pt was back in supine. Pt required increased time to recover to 87% 02 sat on 30L (80% Fi02) HFNC, HR 100bpm at EOS. Pt will benefit from continued skilled physical therapy. Pt will benefit from skilled PT to increase independence and safety with mobility. Acute therapy to follow up during stay to progress functional mobility as able to ensure safe dischare home.       Follow Up Recommendations  CIR     Equipment Recommendations  None recommended by PT    Recommendations for Other Services       Precautions / Restrictions Precautions Precautions: Fall Precaution Comments: monitor vitals, currently on 30L/ 80%FiO2 HFNC Required Braces or Orthoses: Other Brace Other Brace: wears L wrist brace d/t previous injury, suppossed to  not bear weight on wrist, cannot wear brace with IV Restrictions Weight Bearing Restrictions: No LUE Weight Bearing: Weight  bearing as tolerated Other Position/Activity Restrictions: Hx of vertigo and tinnitis (gets dizzy with movement) premedicate, don't open the blinds (the height makes her dizzy)    Mobility  Bed Mobility Overal bed mobility: Needs Assistance Bed Mobility: Sit to Supine;Supine to Sit     Supine to sit: HOB elevated;Supervision Sit to supine: Min guard;HOB elevated   General bed mobility comments: able to transfer to sitting at side of bed with use of bed rails and increased time 2/2 dizziness with supervision for safety. Min guard for safety requiring minimal assist to bring Rt LE all the way onto bed.    Transfers Overall transfer level: Needs assistance Equipment used: Bilateral platform walker (EVA) Transfers: Sit to/from Stand Sit to Stand: Min assist;+2 safety/equipment         General transfer comment: 15L NRB donned prior to transfers and gait. Pt able to power to stand using B UEs for power up and MIN assist for safety.  Ambulation/Gait Ambulation/Gait assistance: Min assist;+2 safety/equipment;+2 physical assistance Gait Distance (Feet): 4 Feet Assistive device: Bilateral platform walker (EVA) Gait Pattern/deviations: Step-through pattern;Decreased stride length Gait velocity: decr   General Gait Details: MIN assist +2 for safety with taking steps forward/backward using EVA walker. Pt's max HR during session was 134bpm and 02 low at 76% following ambulation with recovery to low 80's. NRB removed once pt was back in supine. Pt required increased time to recover to 87% 02 sat on 30L (80%) HFNC, HR 100bpm at EOS.   Stairs  Wheelchair Mobility    Modified Rankin (Stroke Patients Only)       Balance Overall balance assessment: Needs assistance Sitting-balance support: Feet supported Sitting balance-Leahy Scale: Fair     Standing balance support: Bilateral upper extremity supported Standing balance-Leahy Scale: Poor Standing balance comment:  reliant on UE on EVA                            Cognition Arousal/Alertness: Awake/alert Behavior During Therapy: WFL for tasks assessed/performed Overall Cognitive Status: Within Functional Limits for tasks assessed Area of Impairment: Memory                     Memory: Decreased short-term memory                Exercises General Exercises - Lower Extremity Ankle Circles/Pumps: AROM;Right;10 reps;Supine (resistance with ankle PF) Heel Slides: AROM;Right;10 reps;Supine (resistance with knee extension) Other Exercises Other Exercises: flutter device 3x3 breaths; review of proper technique with reminders for rest breaks between breaths Other Exercises: incentive spirometer x5 breaths; review of proper technique and reminder to take rests between breaths    General Comments General comments (skin integrity, edema, etc.): pt on 30L (80% Fi02) at start of session      Pertinent Vitals/Pain Pain Assessment: Faces Faces Pain Scale: Hurts a little bit Pain Location: back and abdominal discomfort while EOB; LEs Pain Descriptors / Indicators: Aching;Numbness;Discomfort Pain Intervention(s): Monitored during session;Repositioned    Home Living                      Prior Function            PT Goals (current goals can now be found in the care plan section) Acute Rehab PT Goals Patient Stated Goal: to get better and go home PT Goal Formulation: With patient Time For Goal Achievement: 09/22/20 Potential to Achieve Goals: Fair Progress towards PT goals: Progressing toward goals    Frequency    Min 3X/week      PT Plan Current plan remains appropriate    Co-evaluation PT/OT/SLP Co-Evaluation/Treatment: Yes Reason for Co-Treatment: For patient/therapist safety;To address functional/ADL transfers PT goals addressed during session: Mobility/safety with mobility;Strengthening/ROM OT goals addressed during session: ADL's and self-care       AM-PAC PT "6 Clicks" Mobility   Outcome Measure  Help needed turning from your back to your side while in a flat bed without using bedrails?: A Little Help needed moving from lying on your back to sitting on the side of a flat bed without using bedrails?: A Little Help needed moving to and from a bed to a chair (including a wheelchair)?: A Little Help needed standing up from a chair using your arms (e.g., wheelchair or bedside chair)?: A Little Help needed to walk in hospital room?: A Lot Help needed climbing 3-5 steps with a railing? : Total 6 Click Score: 15    End of Session Equipment Utilized During Treatment: Oxygen Activity Tolerance: Patient tolerated treatment well Patient left: in bed;with call bell/phone within reach Nurse Communication: Mobility status PT Visit Diagnosis: Difficulty in walking, not elsewhere classified (R26.2);Muscle weakness (generalized) (M62.81)     Time: 1419-1500 PT Time Calculation (min) (ACUTE ONLY): 41 min  Charges:                       Elna Breslow, SPT  Acute rehab    Elna Breslow  09/18/2020, 3:34 PM

## 2020-09-18 NOTE — Progress Notes (Signed)
Occupational Therapy Treatment Patient Details Name: [name] MRN: [id number] DOB: 1951/04/10 Today's Date: 09/18/2020    History of present illness Pt is 70 year old female who has not been vaccinated against COVID-19 with a past medical history of OSA not on CPAP, NICM, chronic systolic heart failure, high PVC burden s/p radiofrequency catheter ablation with [name] on 07/18/2018, hypertension who presented to the ED with 2 days of fever, cough, dyspnea. Pt admitted for  Acute hypoxic respiratory failure and Severe Sepsis without septic shock secondary to COVID-19.   OT comments  Patient agreeable to transfer training and standing activity tolerance however declines sitting in chair at end of session. Patient initially on 30L and 80% FIO2 with desat to 83% with bed mobility and prolonged seated rest to try and recover. Ultimately donned 15L NRB for recovery up to 87%. Patient min A x2 for safety to power up to standing from elevated bed height. With EVA walker patient able to take few steps forward/back with desat to 81%, tolerates standing an additional ~30 seconds before returning to EOB. Would have liked to attempt again however patient needing prolonged recovery and reports her legs are going numb sitting EOB and ultimately returning to semi-supine.    Follow Up Recommendations  LTACH    Equipment Recommendations  None recommended by OT       Precautions / Restrictions Precautions Precautions: Fall Precaution Comments: monitor vitals, currently on 30L/ 80%FiO2 HFNC Required Braces or Orthoses: Other Brace Other Brace: wears L wrist brace d/t previous injury, suppossed to  not bear weight on wrist, cannot wear brace with IV       Mobility Bed Mobility Overal bed mobility: Needs Assistance Bed Mobility: Supine to Sit;Sit to Supine     Supine to sit: Supervision;HOB elevated Sit to supine: Min guard;HOB elevated   General bed mobility comments: patient able to perform  with increased time    Transfers Overall transfer level: Needs assistance Equipment used: Bilateral platform walker (EVA) Transfers: Sit to/from Stand Sit to Stand: Min assist;+2 safety/equipment;From elevated surface         General transfer comment: with bed height elevated patient able to power up to standing with min A x2 for safety/line management and take few steps forward/back.    Balance Overall balance assessment: Needs assistance Sitting-balance support: Feet supported Sitting balance-Leahy Scale: Fair     Standing balance support: Bilateral upper extremity supported Standing balance-Leahy Scale: Poor Standing balance comment: reliant on UE on EVA                           ADL either performed or assessed with clinical judgement   ADL Overall ADL's : Needs assistance/impaired                         Toilet Transfer: Minimal assistance;+2 for safety/equipment (eva walker) Toilet Transfer Details (indicate cue type and reason): patient able to take few steps forward/back from EOB with eva walker and min A x2 for safety and line management. O2 dropped to 81% on 30L 80% FiO2 and NRB 15L.                           Cognition Arousal/Alertness: Awake/alert Behavior During Therapy: WFL for tasks assessed/performed Overall Cognitive Status: Impaired/Different from baseline Area of Impairment: Memory  Memory: Decreased short-term memory                        General Comments note HR up to 129-130 and desat to 81% on 30L 80% Fio2 and 15L NRB. increased time for recovery, poor STM for remembering PLB strategy    Pertinent Vitals/ Pain       Pain Assessment: Faces Faces Pain Scale: Hurts little more Pain Location: LEs Pain Descriptors / Indicators: Numbness Pain Intervention(s): Monitored during session         Frequency  Min 2X/week        Progress Toward Goals  OT Goals(current goals can  now be found in the care plan section)  Progress towards OT goals: Progressing toward goals  Acute Rehab OT Goals Patient Stated Goal: to get better and go home OT Goal Formulation: With patient Time For Goal Achievement: 09/23/20 Potential to Achieve Goals: Fair ADL Goals Pt Will Perform Lower Body Dressing: sit to/from stand;with min assist Pt Will Transfer to Toilet: with min assist;stand pivot transfer;bedside commode Pt Will Perform Toileting - Clothing Manipulation and hygiene: with min assist;sit to/from stand Pt/caregiver will Perform Home Exercise Program: Increased strength;Both right and left upper extremity;With theraband;Independently  Plan Discharge plan remains appropriate    Co-evaluation    PT/OT/SLP Co-Evaluation/Treatment: Yes Reason for Co-Treatment: For patient/therapist safety;To address functional/ADL transfers   OT goals addressed during session: ADL's and self-care      AM-PAC OT "6 Clicks" Daily Activity     Outcome Measure   Help from another person eating meals?: None Help from another person taking care of personal grooming?: A Little Help from another person toileting, which includes using toliet, bedpan, or urinal?: A Lot Help from another person bathing (including washing, rinsing, drying)?: A Lot Help from another person to put on and taking off regular upper body clothing?: A Lot Help from another person to put on and taking off regular lower body clothing?: A Lot 6 Click Score: 15    End of Session Equipment Utilized During Treatment: Gait belt;Oxygen (EVA)  OT Visit Diagnosis: Other abnormalities of gait and mobility (R26.89);Muscle weakness (generalized) (M62.81)   Activity Tolerance Patient tolerated treatment well   Patient Left in bed;with call bell/phone within reach;Other (comment) (PT present)   Nurse Communication Mobility status        Time: 0623-7628 OT Time Calculation (min): 31 min  Charges: OT General Charges $OT  Visit: 1 Visit OT Treatments $Self Care/Home Management : 8-22 mins  Delbert Phenix OT OT pager: [phone]   Rosemary Holms 09/18/2020, 3:05 PM

## 2020-09-18 NOTE — Plan of Care (Signed)
  Problem: Respiratory: Goal: Will maintain a patent airway Outcome: Progressing   Problem: Clinical Measurements: Goal: Ability to maintain clinical measurements within normal limits will improve Outcome: Progressing Goal: Will remain free from infection Outcome: Progressing   Problem: Activity: Goal: Risk for activity intolerance will decrease Outcome: Progressing   Problem: Pain Managment: Goal: General experience of comfort will improve Outcome: Progressing   Problem: Safety: Goal: Ability to remain free from injury will improve Outcome: Progressing   Problem: Skin Integrity: Goal: Risk for impaired skin integrity will decrease Outcome: Progressing

## 2020-09-18 NOTE — Progress Notes (Signed)
Rehab Admissions Coordinator Note:  Patient was screened by Cleatrice Burke for appropriateness for an Inpatient Acute Rehab Consult per therapy change in recommendation. Patient not yet at a level to tolerate the intensity required of an inpt rehab admit at her current high Fio2 requirements of 30 L. We will follow from a distance and I will not place a rehab consult at this time.  Cleatrice Burke RN MSN 09/18/2020, 8:44 AM  I can be reached at [phone].

## 2020-09-19 DIAGNOSIS — Z7189 Other specified counseling: Secondary | ICD-10-CM

## 2020-09-19 DIAGNOSIS — R0602 Shortness of breath: Secondary | ICD-10-CM | POA: Diagnosis not present

## 2020-09-19 DIAGNOSIS — J9601 Acute respiratory failure with hypoxia: Secondary | ICD-10-CM | POA: Diagnosis not present

## 2020-09-19 DIAGNOSIS — U071 COVID-19: Secondary | ICD-10-CM | POA: Diagnosis not present

## 2020-09-19 DIAGNOSIS — Z515 Encounter for palliative care: Secondary | ICD-10-CM

## 2020-09-19 LAB — PNEUMOCYSTIS PCR: Result Pneumocystis PCR: NEGATIVE

## 2020-09-19 LAB — GLUCOSE, CAPILLARY
Glucose-Capillary: 104 mg/dL — ABNORMAL HIGH (ref 70–99)
Glucose-Capillary: 162 mg/dL — ABNORMAL HIGH (ref 70–99)
Glucose-Capillary: 207 mg/dL — ABNORMAL HIGH (ref 70–99)
Glucose-Capillary: 199 mg/dL — ABNORMAL HIGH (ref 70–99)
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
## 2020-09-19 NOTE — Progress Notes (Signed)
NAME:  [name], MRN:  [id number], DOB:  [birth year], LOS: 63 ADMISSION DATE:  07/31/2020, CONSULTATION DATE:  2/3 REFERRING MD:  Karleen Hampshire, CHIEF COMPLAINT:  Dyspnea, abnormal CT chest   History of Present Illness:  70 y/o unvaccinated female admitted on 1/28 due to COVID pneumonia which lead to severe acute respiratory failure with hypoxemia.  She has been hospitalized since due to high O2 requirements.  Pertinent  Medical History  Non-ischemic cardiomyopathy with recovery of LVEF on most recent echocardiograms Asthma Obesity OSA> not on CPAP  Significant Hospital Events: Including procedures, antibiotic start and stop dates in addition to other pertinent events   1/28 Admit for COVID pneumonia, treated with tocilizumab, steroids, remdesivir  CTA PE 1/29 >> diffuse bilateral ground-glass opacities, compatible with diffuse pneumonia vs pulmonary edema, negative for central obstructing PE  2/03 PCCM consulted     Echo 8/24 GI diastolic dysfunction   HRCT Chest 3/8 >> diffuse ground-glass bilaterally, with worsening peribronchovascular regions of consolidation and volume loss in the lower lobes with some developing bronchiectasis. Given patients history of COVID infection, findings likely related to acute infection rather than ILD.  Aortic atherosclerosis, left main and 3 vessel CAD 3/11 PCCM called back for evaluation of ongoing hypoxic respiratory failure, GGO on CT Primaquine 3/11 >> 3/16 Clindamycin 3/11 >>3/17 3/18 atovaquone >   Interim History / Subjective:  Feels about the same Doing incentive spirometry Cough a little better, clearing out secretions from lungs  Objective   Blood pressure 124/82, pulse 95, temperature 98.2 F (36.8 C), temperature source Oral, resp. rate 18, height 5\' 5"  (1.651 m), weight 84.5 kg, SpO2 92 %.    FiO2 (%):  [80 %] 80 %   Intake/Output Summary (Last 24 hours) at 09/19/2020 0802 Last data filed at 09/19/2020 0517 Gross per 24 hour   Intake 240 ml  Output 1550 ml  Net -1310 ml   Filed Weights   07/31/20 1305 09/10/20 0539  Weight: 99.8 kg 84.5 kg    Examination:  General:  Resting comfortably in bed HENT: NCAT OP clear PULM: Crackles bases B, normal effort CV: RRR, no mgr GI: BS+, soft, nontender MSK: normal bulk and tone Neuro: awake, alert, no distress, MAEW   Labs/imaging personally reviewed   Resp  3/5 > mixed org > resp flora  MRSA PCR  3/5 >  POS Sputum for PCP 3/13 >>>negative  Resolved Hospital Problem list     Assessment & Plan:  Acute hypoxemic respiratory failure due to COVID pneumonia Bronchiectasis likely due to damage from COVID, though she reports having severe pneumonia as a child requiring chest physiotherapy, perhaps some baseline airway damage? OSA> not on CPAP at home  Discussion:  I explained to her that it is unclear if she will ever return to baseline as she has had little progress over the last 7 weeks.  She needs to contemplate whether or not she wants to live in a hospital the rest of her life.  Plan to meet with palliative care this week with her family.    We had a lengthy conversation today and her questions were answered.  Plan: Continue prednisone as ordered Continue heated high flow Continue pulmonary toilette measures> incentive, flutter, hypertonic saline Continue BIPAP at night Will meet with family and palliative care on Monday   Best practice (evaluated daily)  Per [facility]  Labs   CBC: Recent Labs  Lab 09/14/20 0402 09/17/20 0409  WBC 9.7 9.5  HGB 13.7 13.4  HCT 41.7 41.1  MCV 86.9 87.3  PLT 202 161    Basic Metabolic Panel: Recent Labs  Lab 09/14/20 0402 09/17/20 0409  NA 138 136  K 3.4* 3.6  CL 104 103  CO2 24 25  GLUCOSE 115* 109*  BUN 22 20  CREATININE 0.78 0.71  CALCIUM 9.5 9.3   GFR: Estimated Creatinine Clearance: 71.2 mL/min (by C-G formula based on SCr of 0.71 mg/dL). Recent Labs  Lab 09/14/20 0402 09/17/20 0409  WBC 9.7  9.5    Liver Function Tests: No results for input(s): AST, ALT, ALKPHOS, BILITOT, PROT, ALBUMIN in the last 168 hours. No results for input(s): LIPASE, AMYLASE in the last 168 hours. No results for input(s): AMMONIA in the last 168 hours.  ABG    Component Value Date/Time   PHART 7.472 (H) 08/06/2020 0759   PCO2ART 30.8 (L) 08/06/2020 0759   PO2ART 92.3 08/06/2020 0759   HCO3 22.3 08/06/2020 0759   TCO2 23 04/30/2018 1358   ACIDBASEDEF 0.0 08/06/2020 0759   O2SAT 96.9 08/06/2020 0759     Coagulation Profile: No results for input(s): INR, PROTIME in the last 168 hours.  Cardiac Enzymes: No results for input(s): CKTOTAL, CKMB, CKMBINDEX, TROPONINI in the last 168 hours.  HbA1C: Hgb A1c MFr Bld  Date/Time Value Ref Range Status  08/01/2020 05:03 AM 6.0 (H) 4.8 - 5.6 % Final    Comment:    (NOTE) Pre diabetes:          5.7%-6.4%  Diabetes:              >6.4%  Glycemic control for   <7.0% adults with diabetes   09/19/2014 06:47 PM 5.8 (H) 4.8 - 5.6 % Final    Comment:    (NOTE)         Pre-diabetes: 5.7 - 6.4         Diabetes: >6.4         Glycemic control for adults with diabetes: <7.0     CBG: Recent Labs  Lab 09/18/20 0758 09/18/20 1136 09/18/20 1647 09/18/20 2047 09/19/20 0721  GLUCAP 93 185* 221* 130* 104*     Critical care time: n/a Spent > 35 minutes in direct consultation with the patient as well as discussing her management with her primary and other consultant services.     Roselie Awkward, MD [facility] Pager: [phone] Cell: [phone] If no response, please call [phone] until 7pm After 7:00 pm call Elink  [phone]

## 2020-09-19 NOTE — Progress Notes (Signed)
Changed water bottle on heated HFNC 02 system.
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
## 2020-09-19 NOTE — Progress Notes (Signed)
Removed PT from BiPAP and placed on heated high flow nasal cannula (on Flow sheet). RN aware.

## 2020-09-20 DIAGNOSIS — U071 COVID-19: Secondary | ICD-10-CM | POA: Diagnosis not present

## 2020-09-20 DIAGNOSIS — J9601 Acute respiratory failure with hypoxia: Secondary | ICD-10-CM | POA: Diagnosis not present

## 2020-09-20 DIAGNOSIS — F411 Generalized anxiety disorder: Secondary | ICD-10-CM | POA: Diagnosis not present

## 2020-09-20 DIAGNOSIS — Z7189 Other specified counseling: Secondary | ICD-10-CM | POA: Diagnosis not present

## 2020-09-20 DIAGNOSIS — R0602 Shortness of breath: Secondary | ICD-10-CM | POA: Diagnosis not present

## 2020-09-20 LAB — GLUCOSE, CAPILLARY
Glucose-Capillary: 107 mg/dL — ABNORMAL HIGH (ref 70–99)
Glucose-Capillary: 127 mg/dL — ABNORMAL HIGH (ref 70–99)
Glucose-Capillary: 243 mg/dL — ABNORMAL HIGH (ref 70–99)
Glucose-Capillary: 147 mg/dL — ABNORMAL HIGH (ref 70–99)

## 2020-09-20 MED ORDER — BUSPIRONE HCL 5 MG PO TABS
15.0000 mg | ORAL_TABLET | Freq: Two times a day (BID) | ORAL | Status: DC
  Administered 2020-09-20 – 2020-09-23 (×6): 15 mg via ORAL
  Filled 2020-09-20 (×6): qty 1
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
## 2020-09-20 NOTE — Plan of Care (Signed)
  Problem: Clinical Measurements: Goal: Respiratory complications will improve Outcome: Progressing   Problem: Coping: Goal: Level of anxiety will decrease Outcome: Progressing   Problem: Pain Managment: Goal: General experience of comfort will improve Outcome: Progressing   Problem: Safety: Goal: Ability to remain free from injury will improve Outcome: Progressing   

## 2020-09-20 NOTE — Progress Notes (Signed)
Removed PT from BiPAP at approx 0820 and placed on heated high flow 02 system. RN aware.
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
## 2020-09-20 NOTE — Progress Notes (Signed)
Physical Therapy Treatment Patient Details Name: [name] MRN: [id number] DOB: [birth year] Today's Date: 09/20/2020    History of Present Illness Pt is 70 year old female who has not been vaccinated against COVID-19 with a past medical history of OSA not on CPAP, NICM, chronic systolic heart failure, high PVC burden s/p radiofrequency catheter ablation with [name] on 07/18/2018, hypertension who presented to the ED with 2 days of fever, cough, dyspnea. Pt admitted for  Acute hypoxic respiratory failure and Severe Sepsis without septic shock secondary to COVID-19.    PT Comments    Pt continues to be motivated and agreeable to PT. dtr here visiting today.  [name] continues to desat significantly with minimal activity however lower max HR (130) today and recovery time shorter with HR decr to low 100s within 1 min.  SpO2= 74% at lowest after a few steps/transfer to [name]. Recovers to 80% in ~ 3-4 minutes. resting SpO2= 90-92%, on 30L/70% FiO2 HHFNC. NRB not utilized for recovery today.  Continue PT   Follow Up Recommendations  CIR     Equipment Recommendations  None recommended by PT    Recommendations for Other Services       Precautions / Restrictions Precautions Precautions: Fall Precaution Comments: monitor vitals, currently on 30L/ 70%FiO2 HHFNC Restrictions LUE Weight Bearing: Weight bearing as tolerated Other Position/Activity Restrictions: Hx of vertigo and tinnitis (gets dizzy with movement) premedicate, don't open the blinds (the height makes her dizzy)    Mobility  Bed Mobility Overal bed mobility: Needs Assistance Bed Mobility: Supine to Sit;Sit to Supine Rolling: Supervision   Supine to sit: HOB elevated;Supervision Sit to supine: Supervision   General bed mobility comments: supervision for safety, pt uses bed rail, no physical assist    Transfers Overall transfer level: Needs assistance Equipment used: Bilateral platform walker (EVA) Transfers: Sit to/from  Stand Sit to Stand: Min assist;+2 safety/equipment Stand pivot transfers: Min assist;+2 safety/equipment       General transfer comment: initially pt begins rocking to stand but is unable. able to stand with light min assist with verbal cues for anterior-superior wt shift and hand placement  Ambulation/Gait Ambulation/Gait assistance: Min assist;+2 safety/equipment;+2 physical assistance Gait Distance (Feet):  (4' x2 lateral steps to [name] and back to [name]) Assistive device:  (EVA)   Gait velocity: decr   General Gait Details: MIN assist +2 for safety with taking steps laterally using EVA walker. Pt's max HR during session was 130bpm and 02 low at 74% following ambulation with recovery to low 80's. NRB not used for recovery. Pt required increased time to recover to mid 80s O2 at 30L (70%) HFNC, HR 103bpm at EOS.   Stairs             Wheelchair Mobility    Modified Rankin (Stroke Patients Only)       Balance   Sitting-balance support: Feet supported Sitting balance-Leahy Scale: Good                                      Cognition Arousal/Alertness: Awake/alert Behavior During Therapy: WFL for tasks assessed/performed Overall Cognitive Status: Within Functional Limits for tasks assessed                                 General Comments: pt remains cooperative and motivated, Palliative Care incr dose of buspar to  help decr anxiety--discussed with pt that this is a good plan, normalcy of anxiety related to DOE. dtr present at beginning and end of session, discussed session wtih dtr      Exercises      General Comments        Pertinent Vitals/Pain Pain Assessment: No/denies pain    Home Living                      Prior Function            PT Goals (current goals can now be found in the care plan section) Acute Rehab PT Goals Patient Stated Goal: to get better and go home PT Goal Formulation: With patient Time For Goal  Achievement: 09/22/20 Potential to Achieve Goals: Fair Progress towards PT goals: Progressing toward goals    Frequency    Min 3X/week      PT Plan Current plan remains appropriate    Co-evaluation              AM-PAC PT "6 Clicks" Mobility   Outcome Measure  Help needed turning from your back to your side while in a flat bed without using bedrails?: A Little Help needed moving from lying on your back to sitting on the side of a flat bed without using bedrails?: A Little Help needed moving to and from a bed to a chair (including a wheelchair)?: A Little Help needed standing up from a chair using your arms (e.g., wheelchair or bedside chair)?: A Little Help needed to walk in Daniel room?: A Lot Help needed climbing 3-5 steps with a railing? : A Lot 6 Click Score: 16    End of Session Equipment Utilized During Treatment: Gait belt;Oxygen Activity Tolerance: Patient tolerated treatment well Patient left: in bed;with call bell/phone within reach;with family/visitor present Nurse Communication: Mobility status PT Visit Diagnosis: Difficulty in walking, not elsewhere classified (R26.2);Muscle weakness (generalized) (M62.81)     Time: 8875-7972 PT Time Calculation (min) (ACUTE ONLY): 29 min  Charges:  $Therapeutic Activity: 23-37 mins                     Baxter Flattery, PT  Acute Rehab Dept ([facility]) [phone] Pager [phone]  09/20/2020    [name], Daniel 09/20/2020, 12:55 PM

## 2020-09-21 DIAGNOSIS — Z515 Encounter for palliative care: Secondary | ICD-10-CM | POA: Diagnosis not present

## 2020-09-21 DIAGNOSIS — R0602 Shortness of breath: Secondary | ICD-10-CM | POA: Diagnosis not present

## 2020-09-21 DIAGNOSIS — J9601 Acute respiratory failure with hypoxia: Secondary | ICD-10-CM | POA: Diagnosis not present

## 2020-09-21 DIAGNOSIS — Z7189 Other specified counseling: Secondary | ICD-10-CM | POA: Diagnosis not present

## 2020-09-21 DIAGNOSIS — U071 COVID-19: Secondary | ICD-10-CM | POA: Diagnosis not present

## 2020-09-21 LAB — CBC
HCT: 41.7 % (ref 36.0–46.0)
Hemoglobin: 13.9 g/dL (ref 12.0–15.0)
MCH: 28.8 pg (ref 26.0–34.0)
MCHC: 33.3 g/dL (ref 30.0–36.0)
MCV: 86.5 fL (ref 80.0–100.0)
Platelets: 171 10*3/uL (ref 150–400)
RBC: 4.82 MIL/uL (ref 3.87–5.11)
RDW: 16.1 % — ABNORMAL HIGH (ref 11.5–15.5)
WBC: 9.9 10*3/uL (ref 4.0–10.5)
nRBC: 0.3 % — ABNORMAL HIGH (ref 0.0–0.2)

## 2020-09-21 LAB — COMPREHENSIVE METABOLIC PANEL
ALT: 27 U/L (ref 0–44)
AST: 27 U/L (ref 15–41)
Albumin: 3 g/dL — ABNORMAL LOW (ref 3.5–5.0)
Alkaline Phosphatase: 71 U/L (ref 38–126)
Anion gap: 9 (ref 5–15)
BUN: 19 mg/dL (ref 8–23)
CO2: 26 mmol/L (ref 22–32)
Calcium: 9.3 mg/dL (ref 8.9–10.3)
Chloride: 98 mmol/L (ref 98–111)
Creatinine, Ser: 0.71 mg/dL (ref 0.44–1.00)
GFR, Estimated: >60 mL/min (ref >60)
Glucose, Bld: 100 mg/dL — ABNORMAL HIGH (ref 70–99)
Potassium: 3.1 mmol/L — ABNORMAL LOW (ref 3.5–5.1)
Sodium: 133 mmol/L — ABNORMAL LOW (ref 135–145)
Total Bilirubin: 1.5 mg/dL — ABNORMAL HIGH (ref 0.3–1.2)
Total Protein: 5.7 g/dL — ABNORMAL LOW (ref 6.5–8.1)

## 2020-09-21 LAB — GLUCOSE, CAPILLARY
Glucose-Capillary: 91 mg/dL (ref 70–99)
Glucose-Capillary: 205 mg/dL — ABNORMAL HIGH (ref 70–99)
Glucose-Capillary: 249 mg/dL — ABNORMAL HIGH (ref 70–99)
Glucose-Capillary: 163 mg/dL — ABNORMAL HIGH (ref 70–99)

## 2020-09-21 LAB — MAGNESIUM: Magnesium: 1.9 mg/dL (ref 1.7–2.4)

## 2020-09-21 MED ORDER — MAGNESIUM SULFATE 2 GM/50ML IV SOLN
2.0000 g | Freq: Once | INTRAVENOUS | Status: AC
  Administered 2020-09-21: 2 g via INTRAVENOUS
  Filled 2020-09-21: qty 50

## 2020-09-21 MED ORDER — POTASSIUM CHLORIDE CRYS ER 20 MEQ PO TBCR
40.0000 meq | EXTENDED_RELEASE_TABLET | Freq: Once | ORAL | Status: AC
  Administered 2020-09-21: 40 meq via ORAL
  Filled 2020-09-21: qty 2

## 2020-09-21 NOTE — Progress Notes (Addendum)
[facility] PCCM  Today Dr. Rowe Pavy coordinated a meeting with Dr. [place] in the to meet with the patient and her 2 daughters Wannetta Sender and Marcie Bal to discuss her condition and prognosis.  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  This is relatively rare though of course over the pandemic we have seen a few patients who have this degree of severe hypoxemia 2 to 3 months after their initial insult.  I explained that at this point there is no specific medical therapy which is known to change the course of the illness and it should be expected that she will remain severely hypoxemic for the rest of her life.  If she were to improve that process would occur over the course of months to over a year.  I explained that I think that is very unlikely that she would improve and if she does survive 4 months to a year she has a very high risk of complications such as pneumonia, GI bleeding, blood clots, skin breakdown etc.  We also explained that there was no facility capable of handling the amount of oxygen that she needs other than a hospital or a long-term acute care facility.  The the patient's daughters were able to voice understanding and accurately described the scenario back to [place] and to their mother.  However, the patient was distraught by this news, somewhat confused, and felt pressured to make a decision.  We explained to her that we were not looking for a decision at this time but she does needs to understand that it is very likely she will live the rest of her life in a hospital environment.  Dr. And were introduced the concept of hospice as an alternative to long-term acute care hospitalization.  The patient's daughter said they will continue to discuss this with their mother over the next few days and will let us know if they have come to a decision regarding hospice versus long-term acute care.  At this point she does not even  qualify for long-term acute care as her oxygen concentration is higher than what [facility].  From my standpoint the [facility] FiO2 requirement of 65% or less is arbitrary and if the family desires to go to an LTACH I would be happy to discuss the scenario with the medical director of the accepting facility.  > 30 minutes spend in preparation for and conducting the meeting.  Roselie Awkward, MD [facility] Pager: [phone] Cell: [phone] If no response, please call [phone] until 7pm After 7:00 pm call Elink  [phone]
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
## 2020-09-21 NOTE — TOC Progression Note (Addendum)
Transition of Care [name]) - Progression Note    Patient Details  Name: [name] MRN: [id number] Date of Birth: 07-16-1950  Transition of Care [facility]) CM/SW Contact  Ross Ludwig, [place] Phone Number: 09/21/2020, 11:35 AM  Clinical Narrative:     Patient does not want to go to [facility], she wants to go to [facility] services.  CSW was informed by [facility] they can accept patient pending insurance authorization.  [facility] will start insurance authorization today.  CSW to continue to follow patient's progress throughout discharge planning.  Per [facility] they can move patient at 70% FiO2 because of the short distance.  They do not have a bed today, but will hopefully have one this week.  CSW to continue to follow patient's progress throughout discharge planning.   Expected Discharge Plan: [place] Barriers to Discharge: No Barriers Identified  Expected Discharge Plan and Services Expected Discharge Plan: [place] In-house Referral: Clinical Social Work     Living arrangements for the past 2 months: Single Family Home                                       Social Determinants of Health (SDOH) Interventions    Readmission Risk Interventions No flowsheet data found.

## 2020-09-21 NOTE — Plan of Care (Signed)
  Problem: Education: Goal: Knowledge of risk factors and measures for prevention of condition will improve Outcome: Progressing   Problem: Clinical Measurements: Goal: Will remain free from infection Outcome: Progressing   Problem: Nutrition: Goal: Adequate nutrition will be maintained Outcome: Progressing   Problem: Pain Managment: Goal: General experience of comfort will improve Outcome: Progressing

## 2020-09-21 NOTE — TOC Progression Note (Signed)
Transition of Care [facility]) - Progression Note    Patient Details  Name: [name] MRN: [id number] Date of Birth: 03-May-1951  [facility]) CM/SW Contact  Purcell Mouton, RN Phone Number: 09/21/2020, 9:42 AM  Clinical Narrative:     [facility] rep called today to offer pt a bed. If pt agreed, [facility] can start authorization today.   Expected Discharge Plan: [place] Barriers to Discharge: No Barriers Identified  Expected Discharge Plan and Services Expected Discharge Plan: [place] In-house Referral: Clinical Social Work     Living arrangements for the past 2 months: Single Family Home                                       Social Determinants of Health (SDOH) Interventions    Readmission Risk Interventions No flowsheet data found.

## 2020-09-21 NOTE — Plan of Care (Signed)
  Problem: Health Behavior/Discharge Planning: Goal: Ability to manage health-related needs will improve Outcome: Progressing   Problem: Clinical Measurements: Goal: Respiratory complications will improve Outcome: Progressing   Problem: Nutrition: Goal: Adequate nutrition will be maintained Outcome: Progressing   Problem: Coping: Goal: Level of anxiety will decrease Outcome: Progressing

## 2020-09-22 ENCOUNTER — Encounter (HOSPITAL_COMMUNITY): Payer: Self-pay | Admitting: Internal Medicine

## 2020-09-22 DIAGNOSIS — J9690 Respiratory failure, unspecified, unspecified whether with hypoxia or hypercapnia: Secondary | ICD-10-CM

## 2020-09-22 DIAGNOSIS — J9601 Acute respiratory failure with hypoxia: Secondary | ICD-10-CM | POA: Diagnosis not present

## 2020-09-22 DIAGNOSIS — Z7189 Other specified counseling: Secondary | ICD-10-CM | POA: Diagnosis not present

## 2020-09-22 DIAGNOSIS — I428 Other cardiomyopathies: Secondary | ICD-10-CM | POA: Diagnosis not present

## 2020-09-22 DIAGNOSIS — U071 COVID-19: Secondary | ICD-10-CM | POA: Diagnosis not present

## 2020-09-22 DIAGNOSIS — Z515 Encounter for palliative care: Secondary | ICD-10-CM | POA: Diagnosis not present

## 2020-09-22 LAB — GLUCOSE, CAPILLARY
Glucose-Capillary: 99 mg/dL (ref 70–99)
Glucose-Capillary: 171 mg/dL — ABNORMAL HIGH (ref 70–99)
Glucose-Capillary: 205 mg/dL — ABNORMAL HIGH (ref 70–99)
Glucose-Capillary: 220 mg/dL — ABNORMAL HIGH (ref 70–99)

## 2020-09-22 MED ORDER — MAGIC MOUTHWASH
10.0000 mL | Freq: Three times a day (TID) | ORAL | Status: DC | PRN
  Filled 2020-09-22 (×2): qty 10
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
## 2020-09-23 ENCOUNTER — Inpatient Hospital Stay
Admit: 2020-09-23 | Discharge: 2020-11-18 | Disposition: A | Discharge status: Hospice | Payer: Medicare HMO | Source: Ambulatory Visit | Attending: Internal Medicine | Admitting: Internal Medicine

## 2020-09-23 ENCOUNTER — Other Ambulatory Visit (HOSPITAL_COMMUNITY): Payer: Medicare HMO

## 2020-09-23 DIAGNOSIS — J9621 Acute and chronic respiratory failure with hypoxia: Secondary | ICD-10-CM | POA: Diagnosis present

## 2020-09-23 DIAGNOSIS — U071 COVID-19: Secondary | ICD-10-CM | POA: Diagnosis present

## 2020-09-23 DIAGNOSIS — J9601 Acute respiratory failure with hypoxia: Secondary | ICD-10-CM | POA: Diagnosis not present

## 2020-09-23 DIAGNOSIS — I5032 Chronic diastolic (congestive) heart failure: Secondary | ICD-10-CM | POA: Diagnosis present

## 2020-09-23 DIAGNOSIS — R079 Chest pain, unspecified: Secondary | ICD-10-CM

## 2020-09-23 DIAGNOSIS — A419 Sepsis, unspecified organism: Secondary | ICD-10-CM | POA: Diagnosis present

## 2020-09-23 DIAGNOSIS — J189 Pneumonia, unspecified organism: Secondary | ICD-10-CM

## 2020-09-23 DIAGNOSIS — I509 Heart failure, unspecified: Secondary | ICD-10-CM

## 2020-09-23 DIAGNOSIS — J9690 Respiratory failure, unspecified, unspecified whether with hypoxia or hypercapnia: Secondary | ICD-10-CM

## 2020-09-23 DIAGNOSIS — R0902 Hypoxemia: Secondary | ICD-10-CM

## 2020-09-23 HISTORY — DX: Sepsis, unspecified organism: R65.20

## 2020-09-23 HISTORY — DX: Chronic diastolic (congestive) heart failure: I50.32

## 2020-09-23 HISTORY — DX: Acute and chronic respiratory failure with hypoxia: J96.21

## 2020-09-23 HISTORY — DX: Sepsis, unspecified organism: A41.9

## 2020-09-23 HISTORY — DX: COVID-19: U07.1

## 2020-09-23 LAB — MAGNESIUM: Magnesium: 2 mg/dL (ref 1.7–2.4)

## 2020-09-23 LAB — COMPREHENSIVE METABOLIC PANEL
ALT: 26 U/L (ref 0–44)
AST: 24 U/L (ref 15–41)
Albumin: 3 g/dL — ABNORMAL LOW (ref 3.5–5.0)
Alkaline Phosphatase: 67 U/L (ref 38–126)
Anion gap: 10 (ref 5–15)
BUN: 16 mg/dL (ref 8–23)
CO2: 23 mmol/L (ref 22–32)
Calcium: 9.2 mg/dL (ref 8.9–10.3)
Chloride: 99 mmol/L (ref 98–111)
Creatinine, Ser: 0.71 mg/dL (ref 0.44–1.00)
GFR, Estimated: >60 mL/min (ref >60)
Glucose, Bld: 101 mg/dL — ABNORMAL HIGH (ref 70–99)
Potassium: 3.5 mmol/L (ref 3.5–5.1)
Sodium: 132 mmol/L — ABNORMAL LOW (ref 135–145)
Total Bilirubin: 0.8 mg/dL (ref 0.3–1.2)
Total Protein: 5.7 g/dL — ABNORMAL LOW (ref 6.5–8.1)

## 2020-09-23 LAB — CBC
HCT: 41.6 % (ref 36.0–46.0)
Hemoglobin: 14 g/dL (ref 12.0–15.0)
MCH: 28.9 pg (ref 26.0–34.0)
MCHC: 33.7 g/dL (ref 30.0–36.0)
MCV: 86 fL (ref 80.0–100.0)
Platelets: 160 10*3/uL (ref 150–400)
RBC: 4.84 MIL/uL (ref 3.87–5.11)
RDW: 16.3 % — ABNORMAL HIGH (ref 11.5–15.5)
WBC: 9.1 10*3/uL (ref 4.0–10.5)
nRBC: 0.3 % — ABNORMAL HIGH (ref 0.0–0.2)

## 2020-09-23 LAB — GLUCOSE, CAPILLARY
Glucose-Capillary: 87 mg/dL (ref 70–99)
Glucose-Capillary: 169 mg/dL — ABNORMAL HIGH (ref 70–99)

## 2020-09-23 MED ORDER — BISACODYL 5 MG PO TBEC: 5.0000 mg | DELAYED_RELEASE_TABLET | Freq: Every day | ORAL | 0 refills | Status: AC | PRN

## 2020-09-23 MED ORDER — POTASSIUM CHLORIDE CRYS ER 20 MEQ PO TBCR: 40.0000 meq | EXTENDED_RELEASE_TABLET | Freq: Every day | ORAL | Status: AC

## 2020-09-23 MED ORDER — GUAIFENESIN-DM 100-10 MG/5ML PO SYRP: 10.0000 mL | ORAL_SOLUTION | Freq: Three times a day (TID) | ORAL | 0 refills | Status: AC

## 2020-09-23 MED ORDER — POLYETHYLENE GLYCOL 3350 17 G PO PACK: 17.0000 g | PACK | Freq: Every day | ORAL | 0 refills | Status: AC

## 2020-09-23 MED ORDER — ATOVAQUONE 750 MG/5ML PO SUSP: 1500.0000 mg | Freq: Every day | ORAL | 0 refills | Status: AC

## 2020-09-23 MED ORDER — PREDNISONE 20 MG PO TABS: 20.0000 mg | ORAL_TABLET | Freq: Every day | ORAL | Status: AC

## 2020-09-23 MED ORDER — FUROSEMIDE 40 MG PO TABS: 40.0000 mg | ORAL_TABLET | Freq: Every day | ORAL | Status: AC

## 2020-09-23 MED ORDER — INSULIN ASPART 100 UNIT/ML ~~LOC~~ SOLN: 0.0000 [IU] | Freq: Three times a day (TID) | SUBCUTANEOUS | 11 refills | Status: AC

## 2020-09-23 MED ORDER — SENNOSIDES-DOCUSATE SODIUM 8.6-50 MG PO TABS: 1.0000 | ORAL_TABLET | Freq: Every day | ORAL | Status: AC

## 2020-09-23 MED ORDER — LACTINEX PO CHEW: 1.0000 | CHEWABLE_TABLET | Freq: Three times a day (TID) | ORAL | Status: AC

## 2020-09-23 MED ORDER — MAGIC MOUTHWASH: 10.0000 mL | Freq: Three times a day (TID) | ORAL | 0 refills | Status: AC | PRN

## 2020-09-23 MED ORDER — FAMOTIDINE 20 MG PO TABS
20.0000 mg | ORAL_TABLET | Freq: Every day | ORAL | Status: AC
Start: 2020-09-24 — End: ?

## 2020-09-23 MED ORDER — ALUM & MAG HYDROXIDE-SIMETH 200-200-20 MG/5ML PO SUSP: 15.0000 mL | Freq: Four times a day (QID) | ORAL | 0 refills | Status: AC | PRN

## 2020-09-23 MED ORDER — BENZONATATE 200 MG PO CAPS: 200.0000 mg | ORAL_CAPSULE | Freq: Three times a day (TID) | ORAL | 0 refills | Status: AC

## 2020-09-23 MED ORDER — APIXABAN 5 MG PO TABS: 5.0000 mg | ORAL_TABLET | Freq: Two times a day (BID) | ORAL | Status: AC

## 2020-09-23 NOTE — TOC Transition Note (Signed)
Transition of Care [facility]) - CM/SW Discharge Note   Patient Details  Name: [name] MRN: [id number] Date of Birth: [birth year]  Transition of Care [facility]) CM/SW Contact:  Ross Ludwig, LCSW Phone Number: 09/23/2020, 1:55 PM   Clinical Narrative:     Patient to be d/c'ed today to [facility] under the care of Dr. Merton Border.  Patient and family agreeable to plans will transport via [place] RN to call report to [phone].  CSW notified patient's daughter that patient is discharging today.  [facility] rep Anderson Malta will complete admission paperwork with patient at bedside.   Final next level of care: Long Term Acute Care (LTAC) Barriers to Discharge: Barriers Resolved   Patient Goals and CMS Choice Patient states their goals for this hospitalization and ongoing recovery are:: To go to [facility] and continue with her recovery services. CMS Medicare.gov Compare Post Acute Care list provided to:: Patient Choice offered to / list presented to : Adult Children,Patient  Discharge Placement              Patient chooses bed at: Other - please specify in the comment section below: ([facility]) Patient to be transferred to facility by: [place] Name of family member notified: Daughter Mindi Slicker [phone] Patient and family notified of of transfer: 09/23/20  Discharge Plan and Services In-house Referral: Clinical Social Work                                   Social Determinants of Health (SDOH) Interventions     Readmission Risk Interventions No flowsheet data found.
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
## 2020-09-23 NOTE — Progress Notes (Signed)
I called pt due to Covid precautions but didn't get a response.

## 2020-09-23 NOTE — Discharge Summary (Addendum)
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 years        BP:           139/92 mmHg Patient Gender: F               HR:           78 bpm. Exam Location:  Inpatient Procedure: Limited Echo, Limited Color Doppler and Cardiac Doppler Indications:    Positive D dimer [[id number]]  History:        Patient has prior history of Echocardiogram examinations, most  recent 11/16/2018. Risk Factors:Hypertension and Diabetes. COVID                 19. Nonischemic cardiomyopathy. Sepsis.  Sonographer:    Darlina Sicilian RDCS Referring Phys: [phone] A CALDWELL POWELL [place]  1. Left ventricular ejection fraction, by estimation, is 60 to 65%. The left ventricle has normal function. The left ventricle has no regional wall motion abnormalities.  There is mild left ventricular hypertrophy. Left ventricular diastolic parameters are consistent with Grade I diastolic dysfunction (impaired relaxation).  2. Right ventricular systolic function is normal. The right ventricular size is normal. Tricuspid regurgitation signal is inadequate for assessing PA pressure.  3. The mitral valve is normal in structure. No evidence of mitral valve regurgitation.  4. The aortic valve is tricuspid. Aortic valve regurgitation is not visualized. FINDINGS  Left Ventricle: Left ventricular ejection fraction, by estimation, is 60 to 65%. The left ventricle has normal function. The left ventricle has no regional wall motion abnormalities. The left ventricular internal cavity size was normal in size. There is  mild left ventricular hypertrophy. Left ventricular diastolic parameters are consistent with Grade I diastolic dysfunction (impaired relaxation). Right Ventricle: The right ventricular size is normal. No increase in right ventricular wall thickness. Right ventricular systolic function is normal. Tricuspid regurgitation signal is inadequate for assessing PA pressure. Pericardium: There is no evidence of pericardial effusion. Mitral Valve: The mitral valve is normal in structure. Aortic Valve: The aortic valve is tricuspid. Aortic valve regurgitation is not visualized. Pulmonic Valve: The pulmonic valve was not well visualized. Pulmonic valve regurgitation is not visualized. LEFT VENTRICLE PLAX 2D LVIDd:         3.90 cm  Diastology LVIDs:         2.70 cm  LV e' medial:    3.37 cm/s LV PW:         1.30 cm  LV E/e' medial:  11.1 LV IVS:        1.30 cm  LV e' lateral:   5.00 cm/s LVOT diam:     2.00 cm  LV E/e' lateral: 7.5 LVOT Area:     3.14 cm  LEFT ATRIUM         Index LA diam:    3.60 cm 1.75 cm/m  MITRAL VALVE MV Area (PHT): 3.91 cm    SHUNTS MV Decel Time: 194 msec    Systemic Diam: 2.00 cm MV E velocity: 37.50 cm/s MV A velocity: 50.60 cm/s MV E/A ratio:  0.74 Oswaldo Milian MD Electronically signed by Oswaldo Milian MD Signature Date/Time: 08/01/2020/1:20:13 PM    Final      Labs:   Basic Metabolic Panel: Recent Labs  Lab 09/17/20 0409 09/21/20 0409 09/23/20 0410  NA 136 133* 132*  K 3.6 3.1* 3.5  CL 103 98 99  CO2 _0 GLUCOSE 109* 100* 101*  BUN _1 CREATININE 0.71 0.71 0.71  CALCIUM 9.3 9.3 9.2  MG  --  1.9 2.0   GFR Estimated Creatinine Clearance: 71.2 mL/min (by C-G formula based on SCr of 0.71 mg/dL). Liver Function Tests: Recent Labs  Lab 09/21/20 0409 09/23/20 0410  AST 27 24  ALT 27 26  ALKPHOS 71 67  BILITOT 1.5* 0.8  PROT 5.7* 5.7*  ALBUMIN 3.0* 3.0*   No results for input(s): LIPASE, AMYLASE in the last 168 hours. No results for input(s): AMMONIA in the last 168 hours. Coagulation profile No results for input(s): INR, PROTIME in  the last 168 hours.  CBC: Recent Labs  Lab 09/17/20 0409 09/21/20 0409 09/23/20 0410  WBC 9.5 9.9 9.1  HGB 13.4 13.9 14.0  HCT 41.1 41.7 41.6  MCV 87.3 86.5 86.0  PLT 192 171 160   Cardiac Enzymes: No results for input(s): CKTOTAL, CKMB, CKMBINDEX, TROPONINI in the last 168 hours. BNP: Invalid input(s): POCBNP CBG: Recent Labs  Lab 09/22/20 1214 09/22/20 1613 09/22/20 2028 09/23/20 0723 09/23/20 1134  GLUCAP 171* 205* 220* 87 169*   D-Dimer No results for input(s): DDIMER in the last 72 hours. Hgb A1c No results for input(s): HGBA1C in the last 72 hours. Lipid Profile No results for input(s): CHOL, HDL, LDLCALC, TRIG, CHOLHDL, LDLDIRECT in the last 72 hours. Thyroid function studies No results for input(s): TSH, T4TOTAL, T3FREE, THYROIDAB in the last 72 hours.  Invalid input(s): FREET3 Anemia work up No results for input(s): VITAMINB12, FOLATE, FERRITIN, TIBC, IRON, RETICCTPCT in the last 72 hours. Microbiology Recent Results (from the past 240 hour(s))  Pneumocystis Smear, DFA     Status: None   Collection Time: 09/14/20  3:41 PM  Result  Value Ref Range Status   Pneumocystis Smear, DFA Negative Negative Final    Comment: (NOTE) Performed At: [facility] [place], [place] [id number] Rush Farmer MD MV:[id number]    Source of Sample EXPECTORATED SPUTUM  Final    Comment: Performed at [name], [place] [address], [place], [place] [address]     Signed: Marlowe Aschoff Marita Burnsed  [facility] 09/23/2020, 1:36 PM

## 2020-09-23 NOTE — Progress Notes (Signed)
Occupational Therapy Treatment Patient Details Name: [name] MRN: [id number] DOB: Dec 14, 1950 Today's Date: 09/23/2020    History of present illness Pt is 70 year old female who has not been vaccinated against COVID-19 with a past medical history of OSA not on CPAP, NICM, chronic systolic heart failure, high PVC burden s/p radiofrequency catheter ablation with [name] on 07/18/2018, hypertension who presented to the ED with 2 days of fever, cough, dyspnea. Pt admitted for  Acute hypoxic respiratory failure and Severe Sepsis without septic shock secondary to COVID-19.   OT comments  Treatment focused on getting patient out of supine - and performing functional task at side of bed - and out of bed in order to promote activity tolerance in preparation for ADLs out of bed. Patient found on 25 L and 65% Fio2. O2 sat dropped to 72% with activity but did not NRB to recover. Increased to mid 80s in approx 5 min with verbal cues for deep breathing. Reports some dyspnea after activity. Patient continues to be limited by anxiety - needing increased time to perform but improved today from prior treatment. Cont POC.    Follow Up Recommendations  [facility]    Equipment Recommendations  Other (comment) (defer to next venue)    Recommendations for Other Services      Precautions / Restrictions Precautions Precautions: Fall Precaution Comments: monitor vitals, currently on 25L/ 65%FiO2 HHFNC Other Brace: wears L wrist brace d/t previous injury, suppossed to  not bear weight on wrist, cannot wear brace with IV Restrictions Weight Bearing Restrictions: No LUE Weight Bearing: Weight bearing as tolerated Other Position/Activity Restrictions: Hx of vertigo and tinnitis (gets dizzy with movement) premedicate, don't open the blinds (the height makes her dizzy)       Mobility Bed Mobility Overal bed mobility: Needs Assistance Bed Mobility: Supine to Sit   Sidelying to sit: Supervision        General bed mobility comments: supervision for safety, pt uses bed rail, no physical assist    Transfers Overall transfer level: Needs assistance Equipment used: Rolling walker (2 wheeled) Transfers: Sit to/from [facility] Sit to Stand: Min assist;From elevated surface Stand pivot transfers: Min guard       General transfer comment: Min assist to stand - patient exhibited difficulty with powering up. Needed bed height increased. min guard to take steps to recliner. O2 sat dropped to 72% and recovered to mid 80s with approx 5 min of verbal cues for breathing techniques. Reports some mild dizziness and shortness of breath after activity.    Balance Overall balance assessment: Needs assistance Sitting-balance support: Feet supported Sitting balance-Leahy Scale: Good     Standing balance support: Bilateral upper extremity supported Standing balance-Leahy Scale: Poor Standing balance comment: reliant on UE, anxious in regards to mobility. also reports transient weakness in LLE.                           ADL either performed or assessed with clinical judgement   ADL   Eating/Feeding: Independent   Grooming: Set up;Oral care;Sitting Grooming Details (indicate cue type and reason): performed oral care seated at side of bed                               General ADL Comments: Able to slip feet into flip flops at side of bed.     Vision Patient Visual Report: No change  from baseline     Perception     Praxis      Cognition Arousal/Alertness: Awake/alert Behavior During Therapy: Anxious Overall Cognitive Status: Within Functional Limits for tasks assessed                                 General Comments: Cognition WFL however there does seem to be some memory impairments or patient not retaining education in regards to breathing techniques and/or respiroatry compromise.        Exercises     Shoulder Instructions        General Comments      Pertinent Vitals/ Pain       Pain Assessment: No/denies pain  Home Living                                          Prior Functioning/Environment              Frequency  Min 2X/week        Progress Toward Goals  OT Goals(current goals can now be found in the care plan section)  Progress towards OT goals: Progressing toward goals  Acute Rehab OT Goals Patient Stated Goal: to get better and go home OT Goal Formulation: With patient Time For Goal Achievement: 10/07/20 Potential to Achieve Goals: [place] Discharge plan remains appropriate    Co-evaluation          OT goals addressed during session: ADL's and self-care (activity tolerance)      AM-PAC OT "6 Clicks" Daily Activity     Outcome Measure   Help from another person eating meals?: None Help from another person taking care of personal grooming?: A Little Help from another person toileting, which includes using toliet, bedpan, or urinal?: A Lot Help from another person bathing (including washing, rinsing, drying)?: A Little Help from another person to put on and taking off regular upper body clothing?: None Help from another person to put on and taking off regular lower body clothing?: A Lot 6 Click Score: 18    End of Session Equipment Utilized During Treatment: Gait belt;Oxygen  OT Visit Diagnosis: Other abnormalities of gait and mobility (R26.89);Muscle weakness (generalized) (M62.81)   Activity Tolerance Patient tolerated treatment well   Patient Left in bed;with call bell/phone within reach;Other (comment)   Nurse Communication Mobility status        Time: [phone] OT Time Calculation (min): 21 min  Charges: OT General Charges $OT Visit: 1 Visit OT Treatments $Therapeutic Activity: 8-22 mins  Derl Barrow, OTR/L [place]  Office [phone] Pager: [place] 09/23/2020, 12:50 PM

## 2020-09-23 NOTE — Progress Notes (Signed)
This morning RT checked on the Pt and she stated she was tired and sleepy because someone closed her door. RT increased her O2 to 100% and 25 flow while she was being placed in a chair, and due to the Pt being tired I didn't want her to get SOB. Pt has been 90% or greater since yesterday. The Pt transferred to the chair yesterday and today and did good. RT will continue to monitor.

## 2020-09-23 NOTE — TOC Progression Note (Signed)
Transition of Care [facility]) - Progression Note    Patient Details  Name: [name] MRN: [id number] Date of Birth: [birth year]  Transition of [facility]) CM/SW Contact  Ross Ludwig, [place] Phone Number: 09/23/2020, 1:05 PM  Clinical Narrative:     CSW was informed that patient has been approved to go to [facility] and they do have a bed available today if patient is medically ready for transfer.  CSW updated bedside nurse and attending physician.   Expected Discharge Plan: [place] Barriers to Discharge: No Barriers Identified  Expected Discharge Plan and Services Expected Discharge Plan: [place] In-house Referral: Clinical Social Work     Living arrangements for the past 2 months: Single Family Home                                       Social Determinants of Health (SDOH) Interventions    Readmission Risk Interventions No flowsheet data found.

## 2020-09-24 DIAGNOSIS — J9621 Acute and chronic respiratory failure with hypoxia: Secondary | ICD-10-CM | POA: Diagnosis not present

## 2020-09-24 DIAGNOSIS — A419 Sepsis, unspecified organism: Secondary | ICD-10-CM | POA: Diagnosis not present

## 2020-09-24 DIAGNOSIS — I5032 Chronic diastolic (congestive) heart failure: Secondary | ICD-10-CM

## 2020-09-24 DIAGNOSIS — U071 COVID-19: Secondary | ICD-10-CM | POA: Diagnosis not present

## 2020-09-24 DIAGNOSIS — R6520 Severe sepsis without septic shock: Secondary | ICD-10-CM

## 2020-09-24 LAB — CBC WITH DIFFERENTIAL/PLATELET
Abs Immature Granulocytes: 0.42 10*3/uL — ABNORMAL HIGH (ref 0.00–0.07)
Basophils Absolute: 0 10*3/uL (ref 0.0–0.1)
Basophils Relative: 0 %
Eosinophils Absolute: 0.1 10*3/uL (ref 0.0–0.5)
Eosinophils Relative: 1 %
HCT: 39.8 % (ref 36.0–46.0)
Hemoglobin: 13.5 g/dL (ref 12.0–15.0)
Immature Granulocytes: 5 %
Lymphocytes Relative: 29 %
Lymphs Abs: 2.6 10*3/uL (ref 0.7–4.0)
MCH: 28.6 pg (ref 26.0–34.0)
MCHC: 33.9 g/dL (ref 30.0–36.0)
MCV: 84.3 fL (ref 80.0–100.0)
Monocytes Absolute: 0.6 10*3/uL (ref 0.1–1.0)
Monocytes Relative: 6 %
Neutro Abs: 5.4 10*3/uL (ref 1.7–7.7)
Neutrophils Relative %: 59 %
Platelets: 162 10*3/uL (ref 150–400)
RBC: 4.72 MIL/uL (ref 3.87–5.11)
RDW: 16.3 % — ABNORMAL HIGH (ref 11.5–15.5)
WBC: 9.2 10*3/uL (ref 4.0–10.5)
nRBC: 0.7 % — ABNORMAL HIGH (ref 0.0–0.2)

## 2020-09-24 LAB — COMPREHENSIVE METABOLIC PANEL
ALT: 23 U/L (ref 0–44)
AST: 25 U/L (ref 15–41)
Albumin: 2.8 g/dL — ABNORMAL LOW (ref 3.5–5.0)
Alkaline Phosphatase: 66 U/L (ref 38–126)
Anion gap: 8 (ref 5–15)
BUN: 15 mg/dL (ref 8–23)
CO2: 28 mmol/L (ref 22–32)
Calcium: 9.5 mg/dL (ref 8.9–10.3)
Chloride: 98 mmol/L (ref 98–111)
Creatinine, Ser: 0.99 mg/dL (ref 0.44–1.00)
GFR, Estimated: >60 mL/min (ref >60)
Glucose, Bld: 102 mg/dL — ABNORMAL HIGH (ref 70–99)
Potassium: 3.9 mmol/L (ref 3.5–5.1)
Sodium: 134 mmol/L — ABNORMAL LOW (ref 135–145)
Total Bilirubin: 1.2 mg/dL (ref 0.3–1.2)
Total Protein: 5.4 g/dL — ABNORMAL LOW (ref 6.5–8.1)

## 2020-09-25 ENCOUNTER — Encounter: Payer: Self-pay | Admitting: Internal Medicine

## 2020-09-25 DIAGNOSIS — A419 Sepsis, unspecified organism: Secondary | ICD-10-CM | POA: Diagnosis present

## 2020-09-25 DIAGNOSIS — I5032 Chronic diastolic (congestive) heart failure: Secondary | ICD-10-CM | POA: Diagnosis present

## 2020-09-25 DIAGNOSIS — J9621 Acute and chronic respiratory failure with hypoxia: Secondary | ICD-10-CM | POA: Diagnosis present

## 2020-09-25 DIAGNOSIS — U071 COVID-19: Secondary | ICD-10-CM | POA: Diagnosis not present

## 2020-09-25 NOTE — Consult Note (Signed)
Pulmonary [place]  PULMONARY SERVICE  Date of Service: 09/24/2020  PULMONARY CRITICAL CARE CONSULT   ETHA STAMBAUGH  QPY:[id number]  DOB: May 19, 1951   DOA: 09/23/2020  Referring Physician: Merton Border, MD  HPI: [name] is a 70 y.o. female seen for follow up of Acute on Chronic Respiratory Failure.  Patient is transferred to our facility for further management and weaning.  Patient initially presented to the transferring hospital with shortness of breath was found to be positive for Covid at that time.  She did receive Tocilizumab also had received remdesivir.  Patient was started initially on high-dose steroids CT scan showed a diffuse bilateral pulmonary infiltrates consistent with ARDS and COVID-19 infection.  Patient was admitted to the ICU had some decompensation of her respiratory status eventually ended up having to be placed on high flow oxygen..  Subsequently she was not able to come off of the higher levels of oxygen so has been transferred to our facility.  Right now she is on heated high flow 30 L 85% FiO2.  Review of Systems:  ROS performed and is unremarkable other than noted above.  Past Medical History:  Diagnosis Date  . Adenomatous colon polyp 2006  . Arrhythmia    Heart  . Arthritis   . Asthma   . Chronic headaches   . Complication of anesthesia   . Diabetes mellitus type II, controlled ([place])    Diet  . Gastric polyp    hyperplastic  . GERD (gastroesophageal reflux disease)   . Headache   . Hyperlipidemia   . Hypertension   . IBS (irritable bowel syndrome)   . Kidney stones   . Lymphocytic colitis   . Myocardial infarct ([place])   . Neuropathy, peripheral   . PONV (postoperative nausea and vomiting)   . Sleep apnea    no CPAP  . Ulcer   . Urinary tract infection     Past Surgical History:  Procedure Laterality Date  . ABDOMINAL HYSTERECTOMY    . ABDOMINAL HYSTERECTOMY    . BACK SURGERY    . BLADDER  REPAIR     x2  . CARDIAC CATHETERIZATION  04/2008  . CHOLECYSTECTOMY  2000  . LEFT HEART CATHETERIZATION WITH CORONARY ANGIOGRAM N/A 08/08/2012   Procedure: LEFT HEART CATHETERIZATION WITH CORONARY ANGIOGRAM;  Surgeon: Lorretta Harp, MD;  Location: [facility] CATH LAB;  Service: Cardiovascular;  Laterality: N/A;  . RIGHT/LEFT HEART CATH AND CORONARY ANGIOGRAPHY N/A 04/30/2018   Procedure: RIGHT/LEFT HEART CATH AND CORONARY ANGIOGRAPHY;  Surgeon: Lorretta Harp, MD;  Location: [place] CV LAB;  Service: Cardiovascular;  Laterality: N/A;  Stephanie Coup ABLATION N/A 07/19/2018   Procedure: Stephanie Coup ABLATION;  Surgeon: Thompson Grayer, MD;  Location: [place] CV LAB;  Service: Cardiovascular;  Laterality: N/A;    Social History:    reports that she has never smoked. She has never used smokeless tobacco. She reports that she does not drink alcohol and does not use drugs.  Family History: Non-Contributory to the present illness  Allergies  Allergen Reactions  . Penicillins Anaphylaxis and Swelling    Has patient had a PCN reaction causing immediate rash, facial/tongue/throat swelling, SOB or lightheadedness with hypotension: Yes Has patient had a PCN reaction causing severe rash involving mucus membranes or skin necrosis: No Has patient had a PCN reaction that required hospitalization: Yes Has patient had a PCN reaction occurring within the last 10 years: No    .  Shellfish-Derived Products Anaphylaxis and Swelling  . Sulfa Antibiotics Anaphylaxis and Swelling  . Codeine Nausea Only  . Morphine Nausea And Vomiting  . Morphine And Related Nausea And Vomiting    Medications: Reviewed on Rounds  Physical Exam:  Vitals: Temperature 96.1 pulse 77 respiratory 24 blood pressure is 144/87 saturations 95%  Ventilator Settings on heated high flow 30 L with an FiO2 of 85%  . General: Comfortable at this time . Eyes: Grossly normal lids, irises & conjunctiva . ENT: grossly tongue is  normal . Neck: no obvious mass . Cardiovascular: S1-S2 normal no gallop or rub . Respiratory: Scattered rhonchi noted bilaterally . Abdomen: Soft and nontender . Skin: no rash seen on limited exam . Musculoskeletal: not rigid . Psychiatric:unable to assess . Neurologic: no seizure no involuntary movements         Labs on Admission:  Basic Metabolic Panel: Recent Labs  Lab 09/21/20 0409 09/23/20 0410 09/24/20 0347  NA 133* 132* 134*  K 3.1* 3.5 3.9  CL 98 99 98  CO2 26 23 28   GLUCOSE 100* 101* 102*  BUN 19 16 15   CREATININE 0.71 0.71 0.99  CALCIUM 9.3 9.2 9.5  MG 1.9 2.0  --     No results for input(s): PHART, PCO2ART, PO2ART, HCO3, O2SAT in the last 168 hours.  Liver Function Tests: Recent Labs  Lab 09/21/20 0409 09/23/20 0410 09/24/20 0347  AST 27 24 25   ALT 27 26 23   ALKPHOS 71 67 66  BILITOT 1.5* 0.8 1.2  PROT 5.7* 5.7* 5.4*  ALBUMIN 3.0* 3.0* 2.8*   No results for input(s): LIPASE, AMYLASE in the last 168 hours. No results for input(s): AMMONIA in the last 168 hours.  CBC: Recent Labs  Lab 09/21/20 0409 09/23/20 0410 09/24/20 0347  WBC 9.9 9.1 9.2  NEUTROABS  --   --  5.4  HGB 13.9 14.0 13.5  HCT 41.7 41.6 39.8  MCV 86.5 86.0 84.3  PLT 171 160 162    Cardiac Enzymes: No results for input(s): CKTOTAL, CKMB, CKMBINDEX, TROPONINI in the last 168 hours.  BNP (last 3 results) Recent Labs    09/11/20 1339  BNP 69.6    ProBNP (last 3 results) No results for input(s): PROBNP in the last 8760 hours.   Radiological Exams on Admission: DG Chest Port 1 View  Result Date: 09/23/2020 CLINICAL DATA:  Post COVID EXAM: PORTABLE CHEST 1 VIEW COMPARISON:  09/11/2020 FINDINGS: Mild diffuse bilateral airspace disease unchanged. This is most prominent in the left base. No pleural effusion. Heart size and vascularity normal. Atherosclerotic calcification aortic arch. No acute skeletal abnormality. IMPRESSION: Persistent diffuse bilateral airspace disease  compatible with changes from COVID pneumonia. Electronically Signed   By: Franchot Gallo M.D.   On: 09/23/2020 18:09    Assessment/Plan Active Problems:   Acute on chronic respiratory failure with hypoxia (HCC)   COVID-19 virus infection   Severe sepsis (HCC)   Chronic heart failure with preserved ejection fraction ([place])   1. Acute on chronic respiratory failure hypoxia we are going to be trying to wean the patient's FiO2 down.  Right now is requiring 85% FiO2 with decent saturations so we will have respiratory therapy wean it down if possible. 2. COVID-19 virus infection recovery patient is status post remdesivir Tocilizumab we will continue to follow the x-rays. 3. Severe sepsis patient apparently grew staph hominis and has been treated with antibiotics has been seen by infectious disease. 4. Chronic heart failure preserved ejection fraction patient  has history of nonischemic cardiomyopathy EF was 60 to 65% with LVH.  We will continue to follow the patient's fluid status closely.  Diuretics as needed.  I have personally seen and evaluated the patient, evaluated laboratory and imaging results, formulated the assessment and plan and placed orders. The Patient requires high complexity decision making with multiple systems involvement.  Case was discussed on Rounds with the Respiratory Therapy Director and the Respiratory staff Time Spent 53minutes  Karigan Cloninger A Avice Funchess, MD [name] Pulmonary Critical Care Medicine Sleep Medicine

## 2020-09-25 NOTE — Progress Notes (Signed)
Pulmonary Critical Care Medicine [place]   PULMONARY CRITICAL CARE SERVICE  PROGRESS NOTE  Date of Service: 09/25/2020   [name]  GUY:[id number]  DOB: Aug 28, 1950   DOA: 09/23/2020  Referring Physician: Merton Border, MD  HPI: [name] is a 70 y.o. female seen for follow up of Acute on Chronic Respiratory Failure.  Patient is resting comfortably right now has been on BiPAP currently on 70% FiO2.  Very slow to improve  Medications: Reviewed on Rounds  Physical Exam:  Vitals: Temperature is 97.7 pulse 74 respiratory 15 blood pressure is 146/84 saturations 97%  Ventilator Settings on BiPAP with an FiO2 of 70% switched over to heated high flow 30 L at 80% FiO2  . General: Comfortable at this time . Eyes: Grossly normal lids, irises & conjunctiva . ENT: grossly tongue is normal . Neck: no obvious mass . Cardiovascular: S1 S2 normal no gallop . Respiratory: No rhonchi no rales are noted at this time . Abdomen: soft . Skin: no rash seen on limited exam . Musculoskeletal: not rigid . Psychiatric:unable to assess . Neurologic: no seizure no involuntary movements         Lab Data:   Basic Metabolic Panel: Recent Labs  Lab 09/21/20 0409 09/23/20 0410 09/24/20 0347  NA 133* 132* 134*  K 3.1* 3.5 3.9  CL 98 99 98  CO2 26 23 28   GLUCOSE 100* 101* 102*  BUN 19 16 15   CREATININE 0.71 0.71 0.99  CALCIUM 9.3 9.2 9.5  MG 1.9 2.0  --     ABG: No results for input(s): PHART, PCO2ART, PO2ART, HCO3, O2SAT in the last 168 hours.  Liver Function Tests: Recent Labs  Lab 09/21/20 0409 09/23/20 0410 09/24/20 0347  AST 27 24 25   ALT 27 26 23   ALKPHOS 71 67 66  BILITOT 1.5* 0.8 1.2  PROT 5.7* 5.7* 5.4*  ALBUMIN 3.0* 3.0* 2.8*   No results for input(s): LIPASE, AMYLASE in the last 168 hours. No results for input(s): AMMONIA in the last 168 hours.  CBC: Recent Labs  Lab 09/21/20 0409 09/23/20 0410 09/24/20 0347  WBC 9.9 9.1 9.2   NEUTROABS  --   --  5.4  HGB 13.9 14.0 13.5  HCT 41.7 41.6 39.8  MCV 86.5 86.0 84.3  PLT 171 160 162    Cardiac Enzymes: No results for input(s): CKTOTAL, CKMB, CKMBINDEX, TROPONINI in the last 168 hours.  BNP (last 3 results) Recent Labs    09/11/20 1339  BNP 69.6    ProBNP (last 3 results) No results for input(s): PROBNP in the last 8760 hours.  Radiological Exams: DG Chest Port 1 View  Result Date: 09/23/2020 CLINICAL DATA:  Post COVID EXAM: PORTABLE CHEST 1 VIEW COMPARISON:  09/11/2020 FINDINGS: Mild diffuse bilateral airspace disease unchanged. This is most prominent in the left base. No pleural effusion. Heart size and vascularity normal. Atherosclerotic calcification aortic arch. No acute skeletal abnormality. IMPRESSION: Persistent diffuse bilateral airspace disease compatible with changes from COVID pneumonia. Electronically Signed   By: Franchot Gallo M.D.   On: 09/23/2020 18:09    Assessment/Plan Active Problems:   Acute on chronic respiratory failure with hypoxia (HCC)   COVID-19 virus infection   Severe sepsis (HCC)   Chronic heart failure with preserved ejection fraction ([place])   1. Acute on chronic respiratory failure hypoxia plan is going to be to continue with high flow oxygen titrate down as tolerated. 2. COVID-19 virus infection in recovery we will  continue to monitor 3. Severe sepsis treated 4. Chronic heart failure preserved ejection fraction at baseline we will continue to follow along.   I have personally seen and evaluated the patient, evaluated laboratory and imaging results, formulated the assessment and plan and placed orders. The Patient requires high complexity decision making with multiple systems involvement.  Rounds were done with the Respiratory Therapy Director and Staff therapists and discussed with nursing staff also.  Allyne Gee, MD [facility] Pulmonary Critical Care Medicine Sleep Medicine

## 2020-09-26 DIAGNOSIS — J9621 Acute and chronic respiratory failure with hypoxia: Secondary | ICD-10-CM | POA: Diagnosis not present

## 2020-09-26 DIAGNOSIS — A419 Sepsis, unspecified organism: Secondary | ICD-10-CM | POA: Diagnosis not present

## 2020-09-26 DIAGNOSIS — U071 COVID-19: Secondary | ICD-10-CM | POA: Diagnosis not present

## 2020-09-26 DIAGNOSIS — I5032 Chronic diastolic (congestive) heart failure: Secondary | ICD-10-CM | POA: Diagnosis not present

## 2020-09-26 LAB — BASIC METABOLIC PANEL
Anion gap: 8 (ref 5–15)
BUN: 21 mg/dL (ref 8–23)
CO2: 24 mmol/L (ref 22–32)
Calcium: 9.9 mg/dL (ref 8.9–10.3)
Chloride: 102 mmol/L (ref 98–111)
Creatinine, Ser: 0.77 mg/dL (ref 0.44–1.00)
GFR, Estimated: >60 mL/min (ref >60)
Glucose, Bld: 160 mg/dL — ABNORMAL HIGH (ref 70–99)
Potassium: 6 mmol/L — ABNORMAL HIGH (ref 3.5–5.1)
Sodium: 134 mmol/L — ABNORMAL LOW (ref 135–145)

## 2020-09-26 LAB — CBC
HCT: 42.5 % (ref 36.0–46.0)
Hemoglobin: 14.3 g/dL (ref 12.0–15.0)
MCH: 28.8 pg (ref 26.0–34.0)
MCHC: 33.6 g/dL (ref 30.0–36.0)
MCV: 85.7 fL (ref 80.0–100.0)
Platelets: 180 10*3/uL (ref 150–400)
RBC: 4.96 MIL/uL (ref 3.87–5.11)
RDW: 16.5 % — ABNORMAL HIGH (ref 11.5–15.5)
WBC: 10.8 10*3/uL — ABNORMAL HIGH (ref 4.0–10.5)
nRBC: 0.3 % — ABNORMAL HIGH (ref 0.0–0.2)

## 2020-09-26 NOTE — Progress Notes (Signed)
Pulmonary Critical Care Medicine [place]   PULMONARY CRITICAL CARE SERVICE  PROGRESS NOTE     [name]  KGS:[id number]  DOB: [birth year]   DOA: 09/23/2020  Referring Physician: Merton Border, MD  HPI: [name] is a 70 y.o. female seen for follow up of Acute on Chronic Respiratory Failure.  Patient is comfortable right now without distress at this time is been on heated high flow using BiPAP at nighttime  Medications: Reviewed on Rounds  Physical Exam:  Vitals: Temperature is 97.6 pulse 76 respiratory rate is 14 blood pressure 153/88  Ventilator Settings off the ventilator using BiPAP at night  . General: Comfortable at this time . Eyes: Grossly normal lids, irises & conjunctiva . ENT: grossly tongue is normal . Neck: no obvious mass . Cardiovascular: S1 S2 normal no gallop . Respiratory: Coarse rhonchi expansion is equal . Abdomen: soft . Skin: no rash seen on limited exam . Musculoskeletal: not rigid . Psychiatric:unable to assess . Neurologic: no seizure no involuntary movements         Lab Data:   Basic Metabolic Panel: Recent Labs  Lab 09/21/20 0409 09/23/20 0410 09/24/20 0347 09/26/20 0255  NA 133* 132* 134* 134*  K 3.1* 3.5 3.9 6.0*  CL 98 99 98 102  CO2 26 23 28 24   GLUCOSE 100* 101* 102* 160*  BUN 19 16 15 21   CREATININE 0.71 0.71 0.99 0.77  CALCIUM 9.3 9.2 9.5 9.9  MG 1.9 2.0  --   --     ABG: No results for input(s): PHART, PCO2ART, PO2ART, HCO3, O2SAT in the last 168 hours.  Liver Function Tests: Recent Labs  Lab 09/21/20 0409 09/23/20 0410 09/24/20 0347  AST 27 24 25   ALT 27 26 23   ALKPHOS 71 67 66  BILITOT 1.5* 0.8 1.2  PROT 5.7* 5.7* 5.4*  ALBUMIN 3.0* 3.0* 2.8*   No results for input(s): LIPASE, AMYLASE in the last 168 hours. No results for input(s): AMMONIA in the last 168 hours.  CBC: Recent Labs  Lab 09/21/20 0409 09/23/20 0410 09/24/20 0347 09/26/20 0255  WBC 9.9 9.1 9.2 10.8*   NEUTROABS  --   --  5.4  --   HGB 13.9 14.0 13.5 14.3  HCT 41.7 41.6 39.8 42.5  MCV 86.5 86.0 84.3 85.7  PLT 171 160 162 180    Cardiac Enzymes: No results for input(s): CKTOTAL, CKMB, CKMBINDEX, TROPONINI in the last 168 hours.  BNP (last 3 results) Recent Labs    09/11/20 1339  BNP 69.6    ProBNP (last 3 results) No results for input(s): PROBNP in the last 8760 hours.  Radiological Exams: No results found.  Assessment/Plan Active Problems:   Acute on chronic respiratory failure with hypoxia (HCC)   COVID-19 virus infection   Severe sepsis (HCC)   Chronic heart failure with preserved ejection fraction ([place])   1. Acute on chronic respiratory failure hypoxia we will continue with using BiPAP.  Continue secretion management supportive care. 2. COVID-19 virus infection recovery phase 3. Severe sepsis treated resolved 4. Chronic heart failure preserved ejection fraction at baseline   I have personally seen and evaluated the patient, evaluated laboratory and imaging results, formulated the assessment and plan and placed orders. The Patient requires high complexity decision making with multiple systems involvement.  Rounds were done with the Respiratory Therapy Director and Staff therapists and discussed with nursing staff also.  Allyne Gee, MD [facility] Pulmonary Critical Care Medicine Sleep Medicine

## 2020-09-27 DIAGNOSIS — J9621 Acute and chronic respiratory failure with hypoxia: Secondary | ICD-10-CM | POA: Diagnosis not present

## 2020-09-27 DIAGNOSIS — U071 COVID-19: Secondary | ICD-10-CM | POA: Diagnosis not present

## 2020-09-27 DIAGNOSIS — I5032 Chronic diastolic (congestive) heart failure: Secondary | ICD-10-CM | POA: Diagnosis not present

## 2020-09-27 DIAGNOSIS — A419 Sepsis, unspecified organism: Secondary | ICD-10-CM | POA: Diagnosis not present

## 2020-09-27 LAB — POTASSIUM: Potassium: 4.2 mmol/L (ref 3.5–5.1)

## 2020-09-27 NOTE — Progress Notes (Signed)
Pulmonary Critical Care Medicine [place]   PULMONARY CRITICAL CARE SERVICE  PROGRESS NOTE     [name]  IOX:[id number]  DOB: August 23, 1950   DOA: 09/23/2020  Referring Physician: Merton Border, MD  HPI: [name] is a 70 y.o. female seen for follow up of Acute on Chronic Respiratory Failure.  Currently is on heated high flow 30 L on 70% FiO2  Medications: Reviewed on Rounds  Physical Exam:  Vitals: Temperature is 97.6 pulse 85 respiratory rate is 14 blood pressure 155/79 saturations 98%  Ventilator Settings on heated high flow 30 L FiO2 70%  . General: Comfortable at this time . Eyes: Grossly normal lids, irises & conjunctiva . ENT: grossly tongue is normal . Neck: no obvious mass . Cardiovascular: S1 S2 normal no gallop . Respiratory: Scattered rhonchi expansion is equal . Abdomen: soft . Skin: no rash seen on limited exam . Musculoskeletal: not rigid . Psychiatric:unable to assess . Neurologic: no seizure no involuntary movements         Lab Data:   Basic Metabolic Panel: Recent Labs  Lab 09/21/20 0409 09/23/20 0410 09/24/20 0347 09/26/20 0255 09/27/20 0401  NA 133* 132* 134* 134*  --   K 3.1* 3.5 3.9 6.0* 4.2  CL 98 99 98 102  --   CO2 26 23 28 24   --   GLUCOSE 100* 101* 102* 160*  --   BUN 19 16 15 21   --   CREATININE 0.71 0.71 0.99 0.77  --   CALCIUM 9.3 9.2 9.5 9.9  --   MG 1.9 2.0  --   --   --     ABG: No results for input(s): PHART, PCO2ART, PO2ART, HCO3, O2SAT in the last 168 hours.  Liver Function Tests: Recent Labs  Lab 09/21/20 0409 09/23/20 0410 09/24/20 0347  AST 27 24 25   ALT 27 26 23   ALKPHOS 71 67 66  BILITOT 1.5* 0.8 1.2  PROT 5.7* 5.7* 5.4*  ALBUMIN 3.0* 3.0* 2.8*   No results for input(s): LIPASE, AMYLASE in the last 168 hours. No results for input(s): AMMONIA in the last 168 hours.  CBC: Recent Labs  Lab 09/21/20 0409 09/23/20 0410 09/24/20 0347 09/26/20 0255  WBC 9.9 9.1 9.2 10.8*   NEUTROABS  --   --  5.4  --   HGB 13.9 14.0 13.5 14.3  HCT 41.7 41.6 39.8 42.5  MCV 86.5 86.0 84.3 85.7  PLT 171 160 162 180    Cardiac Enzymes: No results for input(s): CKTOTAL, CKMB, CKMBINDEX, TROPONINI in the last 168 hours.  BNP (last 3 results) Recent Labs    09/11/20 1339  BNP 69.6    ProBNP (last 3 results) No results for input(s): PROBNP in the last 8760 hours.  Radiological Exams: No results found.  Assessment/Plan Active Problems:   Acute on chronic respiratory failure with hypoxia (HCC)   COVID-19 virus infection   Severe sepsis (HCC)   Chronic heart failure with preserved ejection fraction ([place])   1. Acute on chronic respiratory failure hypoxia patient still requiring heated high flow very very slow to improve using the BiPAP at nighttime. 2. COVID-19 virus infection recovery 3. Severe sepsis treated resolving 4. Chronic heart failure preserved ejection fraction no change    I have personally seen and evaluated the patient, evaluated laboratory and imaging results, formulated the assessment and plan and placed orders. The Patient requires high complexity decision making with multiple systems involvement.  Rounds were done with  the Respiratory Therapy Director and Staff therapists and discussed with nursing staff also.  Allyne Gee, MD [facility] Pulmonary Critical Care Medicine Sleep Medicine

## 2020-09-28 DIAGNOSIS — J9621 Acute and chronic respiratory failure with hypoxia: Secondary | ICD-10-CM | POA: Diagnosis not present

## 2020-09-28 DIAGNOSIS — A419 Sepsis, unspecified organism: Secondary | ICD-10-CM | POA: Diagnosis not present

## 2020-09-28 DIAGNOSIS — U071 COVID-19: Secondary | ICD-10-CM | POA: Diagnosis not present

## 2020-09-28 DIAGNOSIS — I5032 Chronic diastolic (congestive) heart failure: Secondary | ICD-10-CM | POA: Diagnosis not present

## 2020-09-28 NOTE — Progress Notes (Signed)
Pulmonary Critical Care Medicine [place]   PULMONARY CRITICAL CARE SERVICE  PROGRESS NOTE     MASHONDA BROSKI  JJO:[id number]  DOB: 10/19/1950   DOA: 09/23/2020  Referring Physician: Merton Border, MD  HPI: [name] is a 70 y.o. female seen for follow up of Acute on Chronic Respiratory Failure.  Patient currently is on heated high flow oxygen 30 L with 100% FiO2  Medications: Reviewed on Rounds  Physical Exam:  Vitals: Temperature is 96.9 pulse 83 respiratory rate 36 blood pressure is 172/96 saturations 100%  Ventilator Settings off the ventilator on heated high flow  . General: Comfortable at this time . Eyes: Grossly normal lids, irises & conjunctiva . ENT: grossly tongue is normal . Neck: no obvious mass . Cardiovascular: S1 S2 normal no gallop . Respiratory: Scattered rhonchi expansion is equal . Abdomen: soft . Skin: no rash seen on limited exam . Musculoskeletal: not rigid . Psychiatric:unable to assess . Neurologic: no seizure no involuntary movements         Lab Data:   Basic Metabolic Panel: Recent Labs  Lab 09/23/20 0410 09/24/20 0347 09/26/20 0255 09/27/20 0401  NA 132* 134* 134*  --   K 3.5 3.9 6.0* 4.2  CL 99 98 102  --   CO2 23 28 24   --   GLUCOSE 101* 102* 160*  --   BUN 16 15 21   --   CREATININE 0.71 0.99 0.77  --   CALCIUM 9.2 9.5 9.9  --   MG 2.0  --   --   --     ABG: No results for input(s): PHART, PCO2ART, PO2ART, HCO3, O2SAT in the last 168 hours.  Liver Function Tests: Recent Labs  Lab 09/23/20 0410 09/24/20 0347  AST 24 25  ALT 26 23  ALKPHOS 67 66  BILITOT 0.8 1.2  PROT 5.7* 5.4*  ALBUMIN 3.0* 2.8*   No results for input(s): LIPASE, AMYLASE in the last 168 hours. No results for input(s): AMMONIA in the last 168 hours.  CBC: Recent Labs  Lab 09/23/20 0410 09/24/20 0347 09/26/20 0255  WBC 9.1 9.2 10.8*  NEUTROABS  --  5.4  --   HGB 14.0 13.5 14.3  HCT 41.6 39.8 42.5  MCV 86.0 84.3  85.7  PLT 160 162 180    Cardiac Enzymes: No results for input(s): CKTOTAL, CKMB, CKMBINDEX, TROPONINI in the last 168 hours.  BNP (last 3 results) Recent Labs    09/11/20 1339  BNP 69.6    ProBNP (last 3 results) No results for input(s): PROBNP in the last 8760 hours.  Radiological Exams: No results found.  Assessment/Plan Active Problems:   Acute on chronic respiratory failure with hypoxia (HCC)   COVID-19 virus infection   Severe sepsis (HCC)   Chronic heart failure with preserved ejection fraction ([place])   1. Acute on chronic respiratory failure with hypoxia we will continue with heated high flow oxygen continue secretion management supportive care.  Titrate down as tolerated 2. COVID-19 virus infection in recovery 3. Severe sepsis treated slowly improving 4. Chronic heart failure at baseline we will continue to follow along   I have personally seen and evaluated the patient, evaluated laboratory and imaging results, formulated the assessment and plan and placed orders. The Patient requires high complexity decision making with multiple systems involvement.  Rounds were done with the Respiratory Therapy Director and Staff therapists and discussed with nursing staff also.  Allyne Gee, MD [facility] Pulmonary Critical  Care Medicine Sleep Medicine

## 2020-09-29 DIAGNOSIS — J9621 Acute and chronic respiratory failure with hypoxia: Secondary | ICD-10-CM | POA: Diagnosis not present

## 2020-09-29 DIAGNOSIS — A419 Sepsis, unspecified organism: Secondary | ICD-10-CM | POA: Diagnosis not present

## 2020-09-29 DIAGNOSIS — U071 COVID-19: Secondary | ICD-10-CM | POA: Diagnosis not present

## 2020-09-29 DIAGNOSIS — I5032 Chronic diastolic (congestive) heart failure: Secondary | ICD-10-CM | POA: Diagnosis not present

## 2020-09-29 LAB — CBC
HCT: 40.6 % (ref 36.0–46.0)
Hemoglobin: 13.7 g/dL (ref 12.0–15.0)
MCH: 28.8 pg (ref 26.0–34.0)
MCHC: 33.7 g/dL (ref 30.0–36.0)
MCV: 85.5 fL (ref 80.0–100.0)
Platelets: 170 10*3/uL (ref 150–400)
RBC: 4.75 MIL/uL (ref 3.87–5.11)
RDW: 16.2 % — ABNORMAL HIGH (ref 11.5–15.5)
WBC: 6.7 10*3/uL (ref 4.0–10.5)
nRBC: 0.9 % — ABNORMAL HIGH (ref 0.0–0.2)

## 2020-09-29 LAB — BASIC METABOLIC PANEL
Anion gap: 10 (ref 5–15)
BUN: 20 mg/dL (ref 8–23)
CO2: 28 mmol/L (ref 22–32)
Calcium: 9.7 mg/dL (ref 8.9–10.3)
Chloride: 97 mmol/L — ABNORMAL LOW (ref 98–111)
Creatinine, Ser: 0.72 mg/dL (ref 0.44–1.00)
GFR, Estimated: >60 mL/min (ref >60)
Glucose, Bld: 171 mg/dL — ABNORMAL HIGH (ref 70–99)
Potassium: 3.3 mmol/L — ABNORMAL LOW (ref 3.5–5.1)
Sodium: 135 mmol/L (ref 135–145)

## 2020-09-29 NOTE — Progress Notes (Signed)
Pulmonary Critical Care Medicine [place]   PULMONARY CRITICAL CARE SERVICE  PROGRESS NOTE     [name]  HFW:[id number]  DOB: [birth year]   DOA: 09/23/2020  Referring Physician: Merton Border, MD  HPI: [name] is a 70 y.o. female seen for follow up of Acute on Chronic Respiratory Failure.  Patient is on heated high flow we have been able to decrease the flow to 25 L but patient is still requiring 70%.  Medications: Reviewed on Rounds  Physical Exam:  Vitals: Temperature is 97.7 pulse 93 respiratory rate is 25 blood pressure is 134/87 saturations 97%  Ventilator Settings on heated high flow 25 L FiO2 70%  . General: Comfortable at this time . Eyes: Grossly normal lids, irises & conjunctiva . ENT: grossly tongue is normal . Neck: no obvious mass . Cardiovascular: S1 S2 normal no gallop . Respiratory: Scattered rhonchi expansion is equal at this time. . Abdomen: soft . Skin: no rash seen on limited exam . Musculoskeletal: not rigid . Psychiatric:unable to assess . Neurologic: no seizure no involuntary movements         Lab Data:   Basic Metabolic Panel: Recent Labs  Lab 09/23/20 0410 09/24/20 0347 09/26/20 0255 09/27/20 0401 09/29/20 0557  NA 132* 134* 134*  --  135  K 3.5 3.9 6.0* 4.2 3.3*  CL 99 98 102  --  97*  CO2 23 28 24   --  28  GLUCOSE 101* 102* 160*  --  171*  BUN 16 15 21   --  20  CREATININE 0.71 0.99 0.77  --  0.72  CALCIUM 9.2 9.5 9.9  --  9.7  MG 2.0  --   --   --   --     ABG: No results for input(s): PHART, PCO2ART, PO2ART, HCO3, O2SAT in the last 168 hours.  Liver Function Tests: Recent Labs  Lab 09/23/20 0410 09/24/20 0347  AST 24 25  ALT 26 23  ALKPHOS 67 66  BILITOT 0.8 1.2  PROT 5.7* 5.4*  ALBUMIN 3.0* 2.8*   No results for input(s): LIPASE, AMYLASE in the last 168 hours. No results for input(s): AMMONIA in the last 168 hours.  CBC: Recent Labs  Lab 09/23/20 0410 09/24/20 0347  09/26/20 0255 09/29/20 0557  WBC 9.1 9.2 10.8* 6.7  NEUTROABS  --  5.4  --   --   HGB 14.0 13.5 14.3 13.7  HCT 41.6 39.8 42.5 40.6  MCV 86.0 84.3 85.7 85.5  PLT 160 162 180 170    Cardiac Enzymes: No results for input(s): CKTOTAL, CKMB, CKMBINDEX, TROPONINI in the last 168 hours.  BNP (last 3 results) Recent Labs    09/11/20 1339  BNP 69.6    ProBNP (last 3 results) No results for input(s): PROBNP in the last 8760 hours.  Radiological Exams: No results found.  Assessment/Plan Active Problems:   Acute on chronic respiratory failure with hypoxia (HCC)   COVID-19 virus infection   Severe sepsis (HCC)   Chronic heart failure with preserved ejection fraction ([place])   1. Acute on chronic respiratory failure hypoxia plan is going to be to continue with trying to wean FiO2 down as tolerated. 2. COVID-19 virus infection and baseline we will continue to follow along. 3. Severe sepsis treated slow improvement 4. Chronic heart failure preserved ejection fraction continue to follow the fluid status closely   I have personally seen and evaluated the patient, evaluated laboratory and imaging results, formulated the assessment  and plan and placed orders. The Patient requires high complexity decision making with multiple systems involvement.  Rounds were done with the Respiratory Therapy Director and Staff therapists and discussed with nursing staff also.  Allyne Gee, MD [facility] Pulmonary Critical Care Medicine Sleep Medicine

## 2020-09-30 DIAGNOSIS — I5032 Chronic diastolic (congestive) heart failure: Secondary | ICD-10-CM | POA: Diagnosis not present

## 2020-09-30 DIAGNOSIS — U071 COVID-19: Secondary | ICD-10-CM | POA: Diagnosis not present

## 2020-09-30 DIAGNOSIS — J9621 Acute and chronic respiratory failure with hypoxia: Secondary | ICD-10-CM | POA: Diagnosis not present

## 2020-09-30 DIAGNOSIS — A419 Sepsis, unspecified organism: Secondary | ICD-10-CM | POA: Diagnosis not present

## 2020-09-30 LAB — POTASSIUM: Potassium: 3.8 mmol/L (ref 3.5–5.1)

## 2020-09-30 NOTE — Progress Notes (Signed)
Pulmonary Critical Care Medicine [place]   PULMONARY CRITICAL CARE SERVICE  PROGRESS NOTE     [name]  [id number]  DOB: 12-21-1950   DOA: 09/23/2020  Referring Physician: Merton Border, MD  HPI: [name] is a 70 y.o. female seen for follow up of Acute on Chronic Respiratory Failure.  Patient is on high flow nasal cannula has been on 30 L and FiO2 of 80%  Medications: Reviewed on Rounds  Physical Exam:  Vitals: Temperature is 96.6 pulse 92 respiratory rate is 23 blood pressure is 131/87 saturations 97%  Ventilator Settings on heated high flow oxygen off of the ventilator  . General: Comfortable at this time . Eyes: Grossly normal lids, irises & conjunctiva . ENT: grossly tongue is normal . Neck: no obvious mass . Cardiovascular: S1 S2 normal no gallop . Respiratory: No rhonchi no rales are noted at this . Abdomen: soft . Skin: no rash seen on limited exam . Musculoskeletal: not rigid . Psychiatric:unable to assess . Neurologic: no seizure no involuntary movements         Lab Data:   Basic Metabolic Panel: Recent Labs  Lab 09/24/20 0347 09/26/20 0255 09/27/20 0401 09/29/20 0557 09/30/20 0821  NA 134* 134*  --  135  --   K 3.9 6.0* 4.2 3.3* 3.8  CL 98 102  --  97*  --   CO2 28 24  --  28  --   GLUCOSE 102* 160*  --  171*  --   BUN 15 21  --  20  --   CREATININE 0.99 0.77  --  0.72  --   CALCIUM 9.5 9.9  --  9.7  --     ABG: No results for input(s): PHART, PCO2ART, PO2ART, HCO3, O2SAT in the last 168 hours.  Liver Function Tests: Recent Labs  Lab 09/24/20 0347  AST 25  ALT 23  ALKPHOS 66  BILITOT 1.2  PROT 5.4*  ALBUMIN 2.8*   No results for input(s): LIPASE, AMYLASE in the last 168 hours. No results for input(s): AMMONIA in the last 168 hours.  CBC: Recent Labs  Lab 09/24/20 0347 09/26/20 0255 09/29/20 0557  WBC 9.2 10.8* 6.7  NEUTROABS 5.4  --   --   HGB 13.5 14.3 13.7  HCT 39.8 42.5 40.6  MCV  84.3 85.7 85.5  PLT 162 180 170    Cardiac Enzymes: No results for input(s): CKTOTAL, CKMB, CKMBINDEX, TROPONINI in the last 168 hours.  BNP (last 3 results) Recent Labs    09/11/20 1339  BNP 69.6    ProBNP (last 3 results) No results for input(s): PROBNP in the last 8760 hours.  Radiological Exams: No results found.  Assessment/Plan Active Problems:   Acute on chronic respiratory failure with hypoxia (HCC)   COVID-19 virus infection   Severe sepsis (HCC)   Chronic heart failure with preserved ejection fraction ([place])   1. Acute on chronic respiratory failure with hypoxia we will continue with trying to titrate the oxygen down as tolerated. 2. COVID-19 virus infection recovery phase. 3. Severe sepsis resolved 4. Chronic heart failure preserved ejection fraction we will continue with supportive care.   I have personally seen and evaluated the patient, evaluated laboratory and imaging results, formulated the assessment and plan and placed orders. The Patient requires high complexity decision making with multiple systems involvement.  Rounds were done with the Respiratory Therapy Director and Staff therapists and discussed with nursing staff also.  Domanique Luckett  Richardson Dopp, MD [name] Pulmonary Critical Care Medicine Sleep Medicine

## 2020-10-01 DIAGNOSIS — A419 Sepsis, unspecified organism: Secondary | ICD-10-CM | POA: Diagnosis not present

## 2020-10-01 DIAGNOSIS — J9621 Acute and chronic respiratory failure with hypoxia: Secondary | ICD-10-CM | POA: Diagnosis not present

## 2020-10-01 DIAGNOSIS — I5032 Chronic diastolic (congestive) heart failure: Secondary | ICD-10-CM | POA: Diagnosis not present

## 2020-10-01 DIAGNOSIS — U071 COVID-19: Secondary | ICD-10-CM | POA: Diagnosis not present

## 2020-10-01 NOTE — Progress Notes (Signed)
Pulmonary Critical Care Medicine [place]   PULMONARY CRITICAL CARE SERVICE  PROGRESS NOTE     [name]  GQQ:[id number]  DOB: 03-03-1951   DOA: 09/23/2020  Referring Physician: Merton Border, MD  HPI: [name] is a 70 y.o. female seen for follow up of Acute on Chronic Respiratory Failure.  Patient is resting comfortably right now without distress afebrile  Medications: Reviewed on Rounds  Physical Exam:  Vitals: Temperature is 96.4 pulse 77 respiratory rate is 25 blood pressure 130/72 saturations 97%  Ventilator Settings on BiPAP 12/8 overnight  . General: Comfortable at this time . Eyes: Grossly normal lids, irises & conjunctiva . ENT: grossly tongue is normal . Neck: no obvious mass . Cardiovascular: S1 S2 normal no gallop . Respiratory: Coarse breath sounds with a few scattered rhonchi . Abdomen: soft . Skin: no rash seen on limited exam . Musculoskeletal: not rigid . Psychiatric:unable to assess . Neurologic: no seizure no involuntary movements         Lab Data:   Basic Metabolic Panel: Recent Labs  Lab 09/26/20 0255 09/27/20 0401 09/29/20 0557 09/30/20 0821  NA 134*  --  135  --   K 6.0* 4.2 3.3* 3.8  CL 102  --  97*  --   CO2 24  --  28  --   GLUCOSE 160*  --  171*  --   BUN 21  --  20  --   CREATININE 0.77  --  0.72  --   CALCIUM 9.9  --  9.7  --     ABG: No results for input(s): PHART, PCO2ART, PO2ART, HCO3, O2SAT in the last 168 hours.  Liver Function Tests: No results for input(s): AST, ALT, ALKPHOS, BILITOT, PROT, ALBUMIN in the last 168 hours. No results for input(s): LIPASE, AMYLASE in the last 168 hours. No results for input(s): AMMONIA in the last 168 hours.  CBC: Recent Labs  Lab 09/26/20 0255 09/29/20 0557  WBC 10.8* 6.7  HGB 14.3 13.7  HCT 42.5 40.6  MCV 85.7 85.5  PLT 180 170    Cardiac Enzymes: No results for input(s): CKTOTAL, CKMB, CKMBINDEX, TROPONINI in the last 168 hours.  BNP  (last 3 results) Recent Labs    09/11/20 1339  BNP 69.6    ProBNP (last 3 results) No results for input(s): PROBNP in the last 8760 hours.  Radiological Exams: No results found.  Assessment/Plan Active Problems:   Acute on chronic respiratory failure with hypoxia (HCC)   COVID-19 virus infection   Severe sepsis (HCC)   Chronic heart failure with preserved ejection fraction ([place])   1. Acute on chronic respiratory failure with hypoxia the plan is going to be to continue with heated high flow titrate the oxygen down as tolerated. 2. COVID-19 virus infection recovery phase we will continue to follow 3. Severe sepsis treated improving 4. Chronic heart failure preserved ejection fraction patient is at baseline.   I have personally seen and evaluated the patient, evaluated laboratory and imaging results, formulated the assessment and plan and placed orders. The Patient requires high complexity decision making with multiple systems involvement.  Rounds were done with the Respiratory Therapy Director and Staff therapists and discussed with nursing staff also.  Allyne Gee, MD [facility] Pulmonary Critical Care Medicine Sleep Medicine

## 2020-10-04 LAB — CBC
HCT: 40.4 % (ref 36.0–46.0)
Hemoglobin: 13.7 g/dL (ref 12.0–15.0)
MCH: 30 pg (ref 26.0–34.0)
MCHC: 33.9 g/dL (ref 30.0–36.0)
MCV: 88.4 fL (ref 80.0–100.0)
Platelets: 138 10*3/uL — ABNORMAL LOW (ref 150–400)
RBC: 4.57 MIL/uL (ref 3.87–5.11)
RDW: 18.8 % — ABNORMAL HIGH (ref 11.5–15.5)
WBC: 5.8 10*3/uL (ref 4.0–10.5)
nRBC: 0.3 % — ABNORMAL HIGH (ref 0.0–0.2)

## 2020-10-04 LAB — BASIC METABOLIC PANEL
Anion gap: 9 (ref 5–15)
BUN: 17 mg/dL (ref 8–23)
CO2: 26 mmol/L (ref 22–32)
Calcium: 9.1 mg/dL (ref 8.9–10.3)
Chloride: 103 mmol/L (ref 98–111)
Creatinine, Ser: 0.65 mg/dL (ref 0.44–1.00)
GFR, Estimated: >60 mL/min (ref >60)
Glucose, Bld: 105 mg/dL — ABNORMAL HIGH (ref 70–99)
Potassium: 3.2 mmol/L — ABNORMAL LOW (ref 3.5–5.1)
Sodium: 138 mmol/L (ref 135–145)

## 2020-10-04 LAB — HEMOGLOBIN A1C
Hgb A1c MFr Bld: 6 % — ABNORMAL HIGH (ref 4.8–5.6)
Mean Plasma Glucose: 125.5 mg/dL

## 2020-10-04 LAB — MAGNESIUM: Magnesium: 1.9 mg/dL (ref 1.7–2.4)

## 2020-10-06 LAB — POTASSIUM: Potassium: 3.4 mmol/L — ABNORMAL LOW (ref 3.5–5.1)

## 2020-10-07 LAB — BASIC METABOLIC PANEL
Anion gap: 5 (ref 5–15)
BUN: 13 mg/dL (ref 8–23)
CO2: 27 mmol/L (ref 22–32)
Calcium: 9.1 mg/dL (ref 8.9–10.3)
Chloride: 106 mmol/L (ref 98–111)
Creatinine, Ser: 0.64 mg/dL (ref 0.44–1.00)
GFR, Estimated: >60 mL/min (ref >60)
Glucose, Bld: 173 mg/dL — ABNORMAL HIGH (ref 70–99)
Potassium: 4.6 mmol/L (ref 3.5–5.1)
Sodium: 138 mmol/L (ref 135–145)

## 2020-10-09 ENCOUNTER — Other Ambulatory Visit (HOSPITAL_COMMUNITY): Payer: Medicare HMO

## 2020-10-12 LAB — BASIC METABOLIC PANEL
Anion gap: 7 (ref 5–15)
BUN: 31 mg/dL — ABNORMAL HIGH (ref 8–23)
CO2: 26 mmol/L (ref 22–32)
Calcium: 9.4 mg/dL (ref 8.9–10.3)
Chloride: 108 mmol/L (ref 98–111)
Creatinine, Ser: 0.61 mg/dL (ref 0.44–1.00)
GFR, Estimated: >60 mL/min (ref >60)
Glucose, Bld: 119 mg/dL — ABNORMAL HIGH (ref 70–99)
Potassium: 3.5 mmol/L (ref 3.5–5.1)
Sodium: 141 mmol/L (ref 135–145)

## 2020-10-12 LAB — CBC
HCT: 37.9 % (ref 36.0–46.0)
Hemoglobin: 12.8 g/dL (ref 12.0–15.0)
MCH: 30.4 pg (ref 26.0–34.0)
MCHC: 33.8 g/dL (ref 30.0–36.0)
MCV: 90 fL (ref 80.0–100.0)
Platelets: 159 10*3/uL (ref 150–400)
RBC: 4.21 MIL/uL (ref 3.87–5.11)
RDW: 19 % — ABNORMAL HIGH (ref 11.5–15.5)
WBC: 5.4 10*3/uL (ref 4.0–10.5)
nRBC: 1.3 % — ABNORMAL HIGH (ref 0.0–0.2)

## 2020-10-12 NOTE — Progress Notes (Signed)
Pulmonary Critical Care Medicine [place]   PULMONARY CRITICAL CARE SERVICE  PROGRESS NOTE     [name]  HQR:[id number]  DOB: 1950/09/20   DOA: 09/23/2020  Referring Physician: Merton Border, MD  HPI: [name] is a 70 y.o. female seen for follow up of Acute on Chronic Respiratory Failure.  Patient has been weaned down on 8 L of oxygen doing better.  Appears to be more comfortable.  Medications: Reviewed on Rounds  Physical Exam:  Vitals: Temperature is 96.5 pulse 78 respiratory rate 17 blood pressure is 143/97 saturations 100%  Ventilator Settings off the ventilator on 8 L Oxymizer  . General: Comfortable at this time . Eyes: Grossly normal lids, irises & conjunctiva . ENT: grossly tongue is normal . Neck: no obvious mass . Cardiovascular: S1 S2 normal no gallop . Respiratory: No rhonchi no rales noted at this time . Abdomen: soft . Skin: no rash seen on limited exam . Musculoskeletal: not rigid . Psychiatric:unable to assess . Neurologic: no seizure no involuntary movements         Lab Data:   Basic Metabolic Panel: Recent Labs  Lab 10/06/20 0906 10/07/20 0449 10/12/20 0434  NA  --  138 141  K 3.4* 4.6 3.5  CL  --  106 108  CO2  --  27 26  GLUCOSE  --  173* 119*  BUN  --  13 31*  CREATININE  --  0.64 0.61  CALCIUM  --  9.1 9.4    ABG: No results for input(s): PHART, PCO2ART, PO2ART, HCO3, O2SAT in the last 168 hours.  Liver Function Tests: No results for input(s): AST, ALT, ALKPHOS, BILITOT, PROT, ALBUMIN in the last 168 hours. No results for input(s): LIPASE, AMYLASE in the last 168 hours. No results for input(s): AMMONIA in the last 168 hours.  CBC: Recent Labs  Lab 10/12/20 0434  WBC 5.4  HGB 12.8  HCT 37.9  MCV 90.0  PLT 159    Cardiac Enzymes: No results for input(s): CKTOTAL, CKMB, CKMBINDEX, TROPONINI in the last 168 hours.  BNP (last 3 results) Recent Labs    09/11/20 1339  BNP 69.6    ProBNP  (last 3 results) No results for input(s): PROBNP in the last 8760 hours.  Radiological Exams: No results found.  Assessment/Plan Active Problems:   Acute on chronic respiratory failure with hypoxia (HCC)   COVID-19 virus infection   Severe sepsis (HCC)   Chronic heart failure with preserved ejection fraction ([place])   1. Acute on chronic respiratory failure hypoxia we will continue to wean on the Oxymizer down as tolerated. 2. COVID-19 virus infection slow improvement. 3. Severe sepsis treated in resolution 4. Chronic heart failure preserved ejection fraction at baseline we will continue to follow   I have personally seen and evaluated the patient, evaluated laboratory and imaging results, formulated the assessment and plan and placed orders. The Patient requires high complexity decision making with multiple systems involvement.  Rounds were done with the Respiratory Therapy Director and Staff therapists and discussed with nursing staff also.  Allyne Gee, MD [facility] Pulmonary Critical Care Medicine Sleep Medicine

## 2020-10-13 NOTE — Progress Notes (Signed)
Pulmonary Critical Care Medicine [place]   PULMONARY CRITICAL CARE SERVICE  PROGRESS NOTE     [name]  ZOX:[id number]  DOB: 22-Sep-1950   DOA: 09/23/2020  Referring Physician: Merton Border, MD  HPI: [name] is a 70 y.o. female seen for follow up of Acute on Chronic Respiratory Failure.  Patient currently is on 8 L Oxymizer using the BiPAP at nighttime  Medications: Reviewed on Rounds  Physical Exam:  Vitals: Temperature is 98.8 pulse 79 respiratory rate 35 blood pressure is 154/86 saturations 92%  Ventilator Settings off the ventilator on Oxymizer 8 L  . General: Comfortable at this time . Eyes: Grossly normal lids, irises & conjunctiva . ENT: grossly tongue is normal . Neck: no obvious mass . Cardiovascular: S1 S2 normal no gallop . Respiratory: No rhonchi no rales are noted at this time . Abdomen: soft . Skin: no rash seen on limited exam . Musculoskeletal: not rigid . Psychiatric:unable to assess . Neurologic: no seizure no involuntary movements         Lab Data:   Basic Metabolic Panel: Recent Labs  Lab 10/07/20 0449 10/12/20 0434  NA 138 141  K 4.6 3.5  CL 106 108  CO2 27 26  GLUCOSE 173* 119*  BUN 13 31*  CREATININE 0.64 0.61  CALCIUM 9.1 9.4    ABG: No results for input(s): PHART, PCO2ART, PO2ART, HCO3, O2SAT in the last 168 hours.  Liver Function Tests: No results for input(s): AST, ALT, ALKPHOS, BILITOT, PROT, ALBUMIN in the last 168 hours. No results for input(s): LIPASE, AMYLASE in the last 168 hours. No results for input(s): AMMONIA in the last 168 hours.  CBC: Recent Labs  Lab 10/12/20 0434  WBC 5.4  HGB 12.8  HCT 37.9  MCV 90.0  PLT 159    Cardiac Enzymes: No results for input(s): CKTOTAL, CKMB, CKMBINDEX, TROPONINI in the last 168 hours.  BNP (last 3 results) Recent Labs    09/11/20 1339  BNP 69.6    ProBNP (last 3 results) No results for input(s): PROBNP in the last 8760  hours.  Radiological Exams: No results found.  Assessment/Plan Active Problems:   Acute on chronic respiratory failure with hypoxia (HCC)   COVID-19 virus infection   Severe sepsis (HCC)   Chronic heart failure with preserved ejection fraction ([place])   1. Acute on chronic respiratory failure hypoxia we will continue with Oxymizer titrate oxygen as tolerated continue pulmonary toilet. 2. COVID-19 virus infection in recovery phase 3. Severe sepsis treated slow improvement 4. Chronic heart failure preserved ejection fraction no change   I have personally seen and evaluated the patient, evaluated laboratory and imaging results, formulated the assessment and plan and placed orders. The Patient requires high complexity decision making with multiple systems involvement.  Rounds were done with the Respiratory Therapy Director and Staff therapists and discussed with nursing staff also.  Allyne Gee, MD [facility] Pulmonary Critical Care Medicine Sleep Medicine

## 2020-10-14 NOTE — Progress Notes (Signed)
Pulmonary Critical Care Medicine [place]   PULMONARY CRITICAL CARE SERVICE  PROGRESS NOTE     [name]  [id number]  DOB: Jun 16, 1951   DOA: 09/23/2020  Referring Physician: Merton Border, MD  HPI: [name] is a 70 y.o. female seen for follow up of Acute on Chronic Respiratory Failure.  Patient is currently on Oxymizer had to go up to 10 L  Medications: Reviewed on Rounds  Physical Exam:  Vitals: Temperature is 97.6 pulse 92 respiratory rate is 34 blood pressure is 138/83 saturations 100%  Ventilator Settings on 10 L Oxymizer  . General: Comfortable at this time . Eyes: Grossly normal lids, irises & conjunctiva . ENT: grossly tongue is normal . Neck: no obvious mass . Cardiovascular: S1 S2 normal no gallop . Respiratory: Scattered rhonchi expansion is equal . Abdomen: soft . Skin: no rash seen on limited exam . Musculoskeletal: not rigid . Psychiatric:unable to assess . Neurologic: no seizure no involuntary movements         Lab Data:   Basic Metabolic Panel: Recent Labs  Lab 10/12/20 0434  NA 141  K 3.5  CL 108  CO2 26  GLUCOSE 119*  BUN 31*  CREATININE 0.61  CALCIUM 9.4    ABG: No results for input(s): PHART, PCO2ART, PO2ART, HCO3, O2SAT in the last 168 hours.  Liver Function Tests: No results for input(s): AST, ALT, ALKPHOS, BILITOT, PROT, ALBUMIN in the last 168 hours. No results for input(s): LIPASE, AMYLASE in the last 168 hours. No results for input(s): AMMONIA in the last 168 hours.  CBC: Recent Labs  Lab 10/12/20 0434  WBC 5.4  HGB 12.8  HCT 37.9  MCV 90.0  PLT 159    Cardiac Enzymes: No results for input(s): CKTOTAL, CKMB, CKMBINDEX, TROPONINI in the last 168 hours.  BNP (last 3 results) Recent Labs    09/11/20 1339  BNP 69.6    ProBNP (last 3 results) No results for input(s): PROBNP in the last 8760 hours.  Radiological Exams: No results found.  Assessment/Plan Active Problems:    Acute on chronic respiratory failure with hypoxia (HCC)   COVID-19 virus infection   Severe sepsis (HCC)   Chronic heart failure with preserved ejection fraction ([place])   1. Acute on chronic respiratory failure hypoxia plan is going to be to continue with trying to wean down on the Oxymizer 2. COVID-19 virus infection recovery 3. Severe sepsis treated continue to follow 4. Chronic heart failure preserved ejection fraction no change   I have personally seen and evaluated the patient, evaluated laboratory and imaging results, formulated the assessment and plan and placed orders. The Patient requires high complexity decision making with multiple systems involvement.  Rounds were done with the Respiratory Therapy Director and Staff therapists and discussed with nursing staff also.  Allyne Gee, MD [facility] Pulmonary Critical Care Medicine Sleep Medicine

## 2020-10-15 LAB — TROPONIN I (HIGH SENSITIVITY): Troponin I (High Sensitivity): 48 ng/L — ABNORMAL HIGH (ref <18)

## 2020-10-15 NOTE — Progress Notes (Signed)
Pulmonary Critical Care Medicine [place]   PULMONARY CRITICAL CARE SERVICE  PROGRESS NOTE     NOVICE VRBA  [id number]  DOB: 04-06-1951   DOA: 09/23/2020  Referring Physician: Merton Border, MD  HPI: [name] is a 70 y.o. female seen for follow up of Acute on Chronic Respiratory Failure.  Currently is on 10 L Oxymizer is doing well using the BiPAP at nighttime  Medications: Reviewed on Rounds  Physical Exam:  Vitals: Temperature is 97.2 pulse 90 respiratory 30 blood pressure is 160/86 saturations 95%  Ventilator Settings on oximetry was in the BiPAP at night  . General: Comfortable at this time . Eyes: Grossly normal lids, irises & conjunctiva . ENT: grossly tongue is normal . Neck: no obvious mass . Cardiovascular: S1 S2 normal no gallop . Respiratory: No rhonchi very coarse breath sounds . Abdomen: soft . Skin: no rash seen on limited exam . Musculoskeletal: not rigid . Psychiatric:unable to assess . Neurologic: no seizure no involuntary movements         Lab Data:   Basic Metabolic Panel: Recent Labs  Lab 10/12/20 0434  NA 141  K 3.5  CL 108  CO2 26  GLUCOSE 119*  BUN 31*  CREATININE 0.61  CALCIUM 9.4    ABG: No results for input(s): PHART, PCO2ART, PO2ART, HCO3, O2SAT in the last 168 hours.  Liver Function Tests: No results for input(s): AST, ALT, ALKPHOS, BILITOT, PROT, ALBUMIN in the last 168 hours. No results for input(s): LIPASE, AMYLASE in the last 168 hours. No results for input(s): AMMONIA in the last 168 hours.  CBC: Recent Labs  Lab 10/12/20 0434  WBC 5.4  HGB 12.8  HCT 37.9  MCV 90.0  PLT 159    Cardiac Enzymes: No results for input(s): CKTOTAL, CKMB, CKMBINDEX, TROPONINI in the last 168 hours.  BNP (last 3 results) Recent Labs    09/11/20 1339  BNP 69.6    ProBNP (last 3 results) No results for input(s): PROBNP in the last 8760 hours.  Radiological Exams: No results  found.  Assessment/Plan Active Problems:   Acute on chronic respiratory failure with hypoxia (HCC)   COVID-19 virus infection   Severe sepsis (HCC)   Chronic heart failure with preserved ejection fraction ([place])   1. Acute on chronic respiratory failure with hypoxia plan continue with the BiPAP at nighttime and titrate oxygen down during the daytime 2. COVID-19 virus infection recovered 3. Severe sepsis resolved 4. Chronic heart failure preserved ejection fraction no change we will continue to follow along   I have personally seen and evaluated the patient, evaluated laboratory and imaging results, formulated the assessment and plan and placed orders. The Patient requires high complexity decision making with multiple systems involvement.  Rounds were done with the Respiratory Therapy Director and Staff therapists and discussed with nursing staff also.  Allyne Gee, MD [facility] Pulmonary Critical Care Medicine Sleep Medicine

## 2020-10-16 ENCOUNTER — Other Ambulatory Visit (HOSPITAL_COMMUNITY): Payer: Medicare HMO

## 2020-10-16 NOTE — Progress Notes (Signed)
Pulmonary Critical Care Medicine [name]   PULMONARY CRITICAL CARE SERVICE  PROGRESS NOTE     [name]  [id number]  DOB: Mar 27, 1951   DOA: 09/23/2020  Referring Physician: Merton Border, MD  HPI: [name] is a 70 y.o. female seen for follow up of Acute on Chronic Respiratory Failure.  Patient is on 10 L Oxymizer had been up to 12 L and was decreased.  Medications: Reviewed on Rounds  Physical Exam:  Vitals: Temperature is 98.7 pulse 70 respiratory 24 blood pressure is 144/70 saturations 96%  Ventilator Settings on 10 L Oxymizer  . General: Comfortable at this time . Eyes: Grossly normal lids, irises & conjunctiva . ENT: grossly tongue is normal . Neck: no obvious mass . Cardiovascular: S1 S2 normal no gallop . Respiratory: No rhonchi very coarse breath sounds . Abdomen: soft . Skin: no rash seen on limited exam . Musculoskeletal: not rigid . Psychiatric:unable to assess . Neurologic: no seizure no involuntary movements         Lab Data:   Basic Metabolic Panel: Recent Labs  Lab 10/12/20 0434  NA 141  K 3.5  CL 108  CO2 26  GLUCOSE 119*  BUN 31*  CREATININE 0.61  CALCIUM 9.4    ABG: No results for input(s): PHART, PCO2ART, PO2ART, HCO3, O2SAT in the last 168 hours.  Liver Function Tests: No results for input(s): AST, ALT, ALKPHOS, BILITOT, PROT, ALBUMIN in the last 168 hours. No results for input(s): LIPASE, AMYLASE in the last 168 hours. No results for input(s): AMMONIA in the last 168 hours.  CBC: Recent Labs  Lab 10/12/20 0434  WBC 5.4  HGB 12.8  HCT 37.9  MCV 90.0  PLT 159    Cardiac Enzymes: No results for input(s): CKTOTAL, CKMB, CKMBINDEX, TROPONINI in the last 168 hours.  BNP (last 3 results) Recent Labs    09/11/20 1339  BNP 69.6    ProBNP (last 3 results) No results for input(s): PROBNP in the last 8760 hours.  Radiological Exams: No results found.  Assessment/Plan Active  Problems:   Acute on chronic respiratory failure with hypoxia (HCC)   COVID-19 virus infection   Severe sepsis (HCC)   Chronic heart failure with preserved ejection fraction ([place])   1. Acute on chronic respiratory failure hypoxia continue with oxygen therapy and supportive care. 2. COVID-19 virus infection recovering 3. Severe sepsis treated improving 4. Chronic heart failure preserved ejection fraction no change   I have personally seen and evaluated the patient, evaluated laboratory and imaging results, formulated the assessment and plan and placed orders. The Patient requires high complexity decision making with multiple systems involvement.  Rounds were done with the Respiratory Therapy Director and Staff therapists and discussed with nursing staff also.  Allyne Gee, MD [facility] Pulmonary Critical Care Medicine Sleep Medicine

## 2020-10-19 LAB — BASIC METABOLIC PANEL
Anion gap: 12 (ref 5–15)
BUN: 26 mg/dL — ABNORMAL HIGH (ref 8–23)
CO2: 28 mmol/L (ref 22–32)
Calcium: 10.2 mg/dL (ref 8.9–10.3)
Chloride: 102 mmol/L (ref 98–111)
Creatinine, Ser: 0.6 mg/dL (ref 0.44–1.00)
GFR, Estimated: >60 mL/min (ref >60)
Glucose, Bld: 118 mg/dL — ABNORMAL HIGH (ref 70–99)
Potassium: 3.1 mmol/L — ABNORMAL LOW (ref 3.5–5.1)
Sodium: 142 mmol/L (ref 135–145)

## 2020-10-19 LAB — CBC
HCT: 45.3 % (ref 36.0–46.0)
Hemoglobin: 15.2 g/dL — ABNORMAL HIGH (ref 12.0–15.0)
MCH: 30.2 pg (ref 26.0–34.0)
MCHC: 33.6 g/dL (ref 30.0–36.0)
MCV: 89.9 fL (ref 80.0–100.0)
Platelets: 231 10*3/uL (ref 150–400)
RBC: 5.04 MIL/uL (ref 3.87–5.11)
RDW: 20.7 % — ABNORMAL HIGH (ref 11.5–15.5)
WBC: 7.4 10*3/uL (ref 4.0–10.5)
nRBC: 0 % (ref 0.0–0.2)

## 2020-10-19 NOTE — Progress Notes (Signed)
Pulmonary Critical Care Medicine [place]   PULMONARY CRITICAL CARE SERVICE  PROGRESS NOTE     [name]  UXN:[id number]  DOB: 09-Dec-1950   DOA: 09/23/2020  Referring Physician: Merton Border, MD  HPI: [name] is a 70 y.o. female seen for follow up of Acute on Chronic Respiratory Failure.  Currently is on 10 L Oxymizer using the BiPAP at nighttime appears to be compliant  Medications: Reviewed on Rounds  Physical Exam:  Vitals: Temperature is 97.9 pulse of 92 respiratory 22 blood pressure is 138/82 saturations 94%  Ventilator Settings currently on 10 L of Oxymizer plan is going to be to continue with the Oxymizer as ordered.  . General: Comfortable at this time . Eyes: Grossly normal lids, irises & conjunctiva . ENT: grossly tongue is normal . Neck: no obvious mass . Cardiovascular: S1 S2 normal no gallop . Respiratory: No rhonchi very coarse breath sounds . Abdomen: soft . Skin: no rash seen on limited exam . Musculoskeletal: not rigid . Psychiatric:unable to assess . Neurologic: no seizure no involuntary movements         Lab Data:   Basic Metabolic Panel: Recent Labs  Lab 10/19/20 0654  NA 142  K 3.1*  CL 102  CO2 28  GLUCOSE 118*  BUN 26*  CREATININE 0.60  CALCIUM 10.2    ABG: No results for input(s): PHART, PCO2ART, PO2ART, HCO3, O2SAT in the last 168 hours.  Liver Function Tests: No results for input(s): AST, ALT, ALKPHOS, BILITOT, PROT, ALBUMIN in the last 168 hours. No results for input(s): LIPASE, AMYLASE in the last 168 hours. No results for input(s): AMMONIA in the last 168 hours.  CBC: Recent Labs  Lab 10/19/20 0654  WBC 7.4  HGB 15.2*  HCT 45.3  MCV 89.9  PLT 231    Cardiac Enzymes: No results for input(s): CKTOTAL, CKMB, CKMBINDEX, TROPONINI in the last 168 hours.  BNP (last 3 results) Recent Labs    09/11/20 1339  BNP 69.6    ProBNP (last 3 results) No results for input(s): PROBNP in the  last 8760 hours.  Radiological Exams: No results found.  Assessment/Plan Active Problems:   Acute on chronic respiratory failure with hypoxia (HCC)   COVID-19 virus infection   Severe sepsis (HCC)   Chronic heart failure with preserved ejection fraction ([place])   1. Acute on chronic respiratory failure with hypoxia plan is going to be to continue with Oxymizer as ordered.  Continue secretion management supportive care. 2. COVID-19 virus infection in recovery phase we will continue to follow 3. Severe sepsis treated improving 4. Chronic heart failure preserved ejection fraction no change   I have personally seen and evaluated the patient, evaluated laboratory and imaging results, formulated the assessment and plan and placed orders. The Patient requires high complexity decision making with multiple systems involvement.  Rounds were done with the Respiratory Therapy Director and Staff therapists and discussed with nursing staff also.  Allyne Gee, MD [facility] Pulmonary Critical Care Medicine Sleep Medicine

## 2020-10-20 LAB — POTASSIUM: Potassium: 3.6 mmol/L (ref 3.5–5.1)

## 2020-10-20 NOTE — Progress Notes (Signed)
Pulmonary Critical Care Medicine [place]   PULMONARY CRITICAL CARE SERVICE  PROGRESS NOTE     MEAGEN LIMONES  ATF:[id number]  DOB: [birth year]   DOA: 09/23/2020  Referring Physician: Merton Border, MD  HPI: [name] is a 70 y.o. female seen for follow up of Acute on Chronic Respiratory Failure.  Patient currently is on Oxymizer had to go up to 12 L because of desaturations  Medications: Reviewed on Rounds  Physical Exam:  Vitals: Temperature is 97.6 pulse 82 respiratory 30 blood pressure is 142/76 saturations 98%  Ventilator Settings on 12 L Oxymizer at this time  . General: Comfortable at this time . Eyes: Grossly normal lids, irises & conjunctiva . ENT: grossly tongue is normal . Neck: no obvious mass . Cardiovascular: S1 S2 normal no gallop . Respiratory: Scattered rhonchi no rales . Abdomen: soft . Skin: no rash seen on limited exam . Musculoskeletal: not rigid . Psychiatric:unable to assess . Neurologic: no seizure no involuntary movements         Lab Data:   Basic Metabolic Panel: Recent Labs  Lab 10/19/20 0654 10/20/20 0430  NA 142  --   K 3.1* 3.6  CL 102  --   CO2 28  --   GLUCOSE 118*  --   BUN 26*  --   CREATININE 0.60  --   CALCIUM 10.2  --     ABG: No results for input(s): PHART, PCO2ART, PO2ART, HCO3, O2SAT in the last 168 hours.  Liver Function Tests: No results for input(s): AST, ALT, ALKPHOS, BILITOT, PROT, ALBUMIN in the last 168 hours. No results for input(s): LIPASE, AMYLASE in the last 168 hours. No results for input(s): AMMONIA in the last 168 hours.  CBC: Recent Labs  Lab 10/19/20 0654  WBC 7.4  HGB 15.2*  HCT 45.3  MCV 89.9  PLT 231    Cardiac Enzymes: No results for input(s): CKTOTAL, CKMB, CKMBINDEX, TROPONINI in the last 168 hours.  BNP (last 3 results) Recent Labs    09/11/20 1339  BNP 69.6    ProBNP (last 3 results) No results for input(s): PROBNP in the last 8760  hours.  Radiological Exams: No results found.  Assessment/Plan Active Problems:   Acute on chronic respiratory failure with hypoxia (HCC)   COVID-19 virus infection   Severe sepsis (HCC)   Chronic heart failure with preserved ejection fraction ([place])   1. Acute on chronic respiratory failure hypoxia we will try to decrease the FiO2 currently on 12 L use the BiPAP at night 2. COVID-19 virus infection recovery we will continue to monitor closely. 3. Severe sepsis treated resolved 4. Chronic heart failure preserved ejection fraction no change in overall   I have personally seen and evaluated the patient, evaluated laboratory and imaging results, formulated the assessment and plan and placed orders. The Patient requires high complexity decision making with multiple systems involvement.  Rounds were done with the Respiratory Therapy Director and Staff therapists and discussed with nursing staff also.  Allyne Gee, MD [name] Pulmonary Critical Care Medicine Sleep Medicine

## 2020-10-21 LAB — BRAIN NATRIURETIC PEPTIDE: B Natriuretic Peptide: 80.7 pg/mL (ref 0.0–100.0)

## 2020-10-21 NOTE — Progress Notes (Signed)
Pulmonary Critical Care Medicine [place]   PULMONARY CRITICAL CARE SERVICE  PROGRESS NOTE     [name]  FKC:[id number]  DOB: 1951-01-31   DOA: 09/23/2020  Referring Physician: Merton Border, MD  HPI: [name] is a 70 y.o. female seen for follow up of Acute on Chronic Respiratory Failure.  Patient currently is on Oxymizer 12 L apparently was made DNI now so we are not going to be placing on mechanical ventilation.  Patient has been using nonrebreather on occasion also  Medications: Reviewed on Rounds  Physical Exam:  Vitals: Temperature is 96.7 pulse 104 respiratory 28 blood pressure is 146/78 saturations 98%  Ventilator Settings Oxymizer 12 L O2  . General: Comfortable at this time . Eyes: Grossly normal lids, irises & conjunctiva . ENT: grossly tongue is normal . Neck: no obvious mass . Cardiovascular: S1 S2 normal no gallop . Respiratory: Scattered rhonchi expansion is equal . Abdomen: soft . Skin: no rash seen on limited exam . Musculoskeletal: not rigid . Psychiatric:unable to assess . Neurologic: no seizure no involuntary movements         Lab Data:   Basic Metabolic Panel: Recent Labs  Lab 10/19/20 0654 10/20/20 0430  NA 142  --   K 3.1* 3.6  CL 102  --   CO2 28  --   GLUCOSE 118*  --   BUN 26*  --   CREATININE 0.60  --   CALCIUM 10.2  --     ABG: No results for input(s): PHART, PCO2ART, PO2ART, HCO3, O2SAT in the last 168 hours.  Liver Function Tests: No results for input(s): AST, ALT, ALKPHOS, BILITOT, PROT, ALBUMIN in the last 168 hours. No results for input(s): LIPASE, AMYLASE in the last 168 hours. No results for input(s): AMMONIA in the last 168 hours.  CBC: Recent Labs  Lab 10/19/20 0654  WBC 7.4  HGB 15.2*  HCT 45.3  MCV 89.9  PLT 231    Cardiac Enzymes: No results for input(s): CKTOTAL, CKMB, CKMBINDEX, TROPONINI in the last 168 hours.  BNP (last 3 results) Recent Labs    09/11/20 1339   BNP 69.6    ProBNP (last 3 results) No results for input(s): PROBNP in the last 8760 hours.  Radiological Exams: No results found.  Assessment/Plan Active Problems:   Acute on chronic respiratory failure with hypoxia (HCC)   COVID-19 virus infection   Severe sepsis (HCC)   Chronic heart failure with preserved ejection fraction ([place])   1. Acute on chronic respiratory failure hypoxia we will continue with oxygen therapy patient is DNI not to be intubated and placed on mechanical ventilation. 2. COVID-19 virus infection in recovery phase we will continue to follow along. 3. Severe sepsis treated resolved 4. Chronic heart failure preserved ejection fraction patient is at baseline we will continue with supportive care   I have personally seen and evaluated the patient, evaluated laboratory and imaging results, formulated the assessment and plan and placed orders. The Patient requires high complexity decision making with multiple systems involvement.  Rounds were done with the Respiratory Therapy Director and Staff therapists and discussed with nursing staff also.  Allyne Gee, MD [facility] Pulmonary Critical Care Medicine Sleep Medicine

## 2020-10-22 LAB — BASIC METABOLIC PANEL
Anion gap: 10 (ref 5–15)
BUN: 19 mg/dL (ref 8–23)
CO2: 28 mmol/L (ref 22–32)
Calcium: 9.8 mg/dL (ref 8.9–10.3)
Chloride: 103 mmol/L (ref 98–111)
Creatinine, Ser: 0.55 mg/dL (ref 0.44–1.00)
GFR, Estimated: >60 mL/min (ref >60)
Glucose, Bld: 119 mg/dL — ABNORMAL HIGH (ref 70–99)
Potassium: 2.9 mmol/L — ABNORMAL LOW (ref 3.5–5.1)
Sodium: 141 mmol/L (ref 135–145)

## 2020-10-22 LAB — CBC
HCT: 43.8 % (ref 36.0–46.0)
Hemoglobin: 14.6 g/dL (ref 12.0–15.0)
MCH: 30 pg (ref 26.0–34.0)
MCHC: 33.3 g/dL (ref 30.0–36.0)
MCV: 89.9 fL (ref 80.0–100.0)
Platelets: 218 10*3/uL (ref 150–400)
RBC: 4.87 MIL/uL (ref 3.87–5.11)
RDW: 20.3 % — ABNORMAL HIGH (ref 11.5–15.5)
WBC: 8.4 10*3/uL (ref 4.0–10.5)
nRBC: 0 % (ref 0.0–0.2)

## 2020-10-22 NOTE — Progress Notes (Signed)
Pulmonary Critical Care Medicine [place]   PULMONARY CRITICAL CARE SERVICE  PROGRESS NOTE     [name]  [id number]  DOB: 1951/01/21   DOA: 09/23/2020  Referring Physician: Merton Border, MD  HPI: [name] is a 70 y.o. female seen for follow up of Acute on Chronic Respiratory Failure.  Patient is on 36 L Oxymizer apparently family is now reconsidering goals of treatment they had initially been considering hospice but now this is on hold.  Medications: Reviewed on Rounds  Physical Exam:  Vitals: Temperature is 96.9 pulse 101 respiratory 33 blood pressure is 163/83 saturations 94%  Ventilator Settings currently is on 11 L Oxymizer good saturations are noted.  . General: Comfortable at this time . Eyes: Grossly normal lids, irises & conjunctiva . ENT: grossly tongue is normal . Neck: no obvious mass . Cardiovascular: S1 S2 normal no gallop . Respiratory: Scattered rhonchi expansion is equal at this time. . Abdomen: soft . Skin: no rash seen on limited exam . Musculoskeletal: not rigid . Psychiatric:unable to assess . Neurologic: no seizure no involuntary movements         Lab Data:   Basic Metabolic Panel: Recent Labs  Lab 10/19/20 0654 10/20/20 0430 10/22/20 0427  NA 142  --  141  K 3.1* 3.6 2.9*  CL 102  --  103  CO2 28  --  28  GLUCOSE 118*  --  119*  BUN 26*  --  19  CREATININE 0.60  --  0.55  CALCIUM 10.2  --  9.8    ABG: No results for input(s): PHART, PCO2ART, PO2ART, HCO3, O2SAT in the last 168 hours.  Liver Function Tests: No results for input(s): AST, ALT, ALKPHOS, BILITOT, PROT, ALBUMIN in the last 168 hours. No results for input(s): LIPASE, AMYLASE in the last 168 hours. No results for input(s): AMMONIA in the last 168 hours.  CBC: Recent Labs  Lab 10/19/20 0654 10/22/20 0427  WBC 7.4 8.4  HGB 15.2* 14.6  HCT 45.3 43.8  MCV 89.9 89.9  PLT 231 218    Cardiac Enzymes: No results for input(s):  CKTOTAL, CKMB, CKMBINDEX, TROPONINI in the last 168 hours.  BNP (last 3 results) Recent Labs    09/11/20 1339 10/21/20 1206  BNP 69.6 80.7    ProBNP (last 3 results) No results for input(s): PROBNP in the last 8760 hours.  Radiological Exams: No results found.  Assessment/Plan Active Problems:   Acute on chronic respiratory failure with hypoxia (HCC)   COVID-19 virus infection   Severe sepsis (HCC)   Chronic heart failure with preserved ejection fraction ([place])   1. Acute on chronic respiratory failure with hypoxia at this time patient is on Oxymizer has been on 11 L also is using as needed 100% nonrebreather.  She is actually not likely to survive her candidacy for recovery is very poor and hospice would be most appropriate. 2. COVID-19 virus infection recovery we will continue with supportive care 3. Severe sepsis treated slow improvement 4. Chronic heart failure preserved ejection fraction no change we will continue to monitor closely.   I have personally seen and evaluated the patient, evaluated laboratory and imaging results, formulated the assessment and plan and placed orders. The Patient requires high complexity decision making with multiple systems involvement.  Rounds were done with the Respiratory Therapy Director and Staff therapists and discussed with nursing staff also.  Allyne Gee, MD [facility] Pulmonary Critical Care Medicine Sleep Medicine

## 2020-10-23 LAB — POTASSIUM: Potassium: 4.3 mmol/L (ref 3.5–5.1)

## 2020-10-23 NOTE — Progress Notes (Signed)
Pulmonary Critical Care Medicine [place]   PULMONARY CRITICAL CARE SERVICE  PROGRESS NOTE     [name]  [id number]  DOB: 06-Sep-1950   DOA: 09/23/2020  Referring Physician: Merton Border, MD  HPI: [name] is a 70 y.o. female seen for follow up of Acute on Chronic Respiratory Failure.  Patient is on 10 L nonrebreather also being supplemented.  Saturations are borderline around 92 to 93%  Medications: Reviewed on Rounds  Physical Exam:  Vitals: Temperature is 98.0 pulse 104 respiratory 32 blood pressure is 149/88 saturations 95%  Ventilator Settings currently on 10 L per nonrebreather  . General: Comfortable at this time . Eyes: Grossly normal lids, irises & conjunctiva . ENT: grossly tongue is normal . Neck: no obvious mass . Cardiovascular: S1 S2 normal no gallop . Respiratory: Scattered rhonchi expansion is equal . Abdomen: soft . Skin: no rash seen on limited exam . Musculoskeletal: not rigid . Psychiatric:unable to assess . Neurologic: no seizure no involuntary movements         Lab Data:   Basic Metabolic Panel: Recent Labs  Lab 10/19/20 0654 10/20/20 0430 10/22/20 0427 10/23/20 0704  NA 142  --  141  --   K 3.1* 3.6 2.9* 4.3  CL 102  --  103  --   CO2 28  --  28  --   GLUCOSE 118*  --  119*  --   BUN 26*  --  19  --   CREATININE 0.60  --  0.55  --   CALCIUM 10.2  --  9.8  --     ABG: No results for input(s): PHART, PCO2ART, PO2ART, HCO3, O2SAT in the last 168 hours.  Liver Function Tests: No results for input(s): AST, ALT, ALKPHOS, BILITOT, PROT, ALBUMIN in the last 168 hours. No results for input(s): LIPASE, AMYLASE in the last 168 hours. No results for input(s): AMMONIA in the last 168 hours.  CBC: Recent Labs  Lab 10/19/20 0654 10/22/20 0427  WBC 7.4 8.4  HGB 15.2* 14.6  HCT 45.3 43.8  MCV 89.9 89.9  PLT 231 218    Cardiac Enzymes: No results for input(s): CKTOTAL, CKMB, CKMBINDEX, TROPONINI in  the last 168 hours.  BNP (last 3 results) Recent Labs    09/11/20 1339 10/21/20 1206  BNP 69.6 80.7    ProBNP (last 3 results) No results for input(s): PROBNP in the last 8760 hours.  Radiological Exams: No results found.  Assessment/Plan Active Problems:   Acute on chronic respiratory failure with hypoxia (HCC)   COVID-19 virus infection   Severe sepsis (HCC)   Chronic heart failure with preserved ejection fraction ([place])   1. Acute on chronic respiratory failure hypoxia we will continue with titrating oxygen as tolerated patient is still very hypoxic requiring ongoing monitoring. 2. COVID-19 virus infection right now is in recovery we will continue to monitor. 3. Severe sepsis resolved 4. Chronic heart failure preserved ejection fraction supportive care monitor fluid status close   I have personally seen and evaluated the patient, evaluated laboratory and imaging results, formulated the assessment and plan and placed orders. The Patient requires high complexity decision making with multiple systems involvement.  Rounds were done with the Respiratory Therapy Director and Staff therapists and discussed with nursing staff also.  Allyne Gee, MD [facility] Pulmonary Critical Care Medicine Sleep Medicine

## 2020-10-24 NOTE — Progress Notes (Signed)
Pulmonary Critical Care Medicine [place]   PULMONARY CRITICAL CARE SERVICE  PROGRESS NOTE     [name]  [id number]  DOB: 12/27/1950   DOA: 09/23/2020  Referring Physician: Merton Border, MD  HPI: [name] is a 70 y.o. female seen for follow up of Acute on Chronic Respiratory Failure.  Patient is on 10 L Oxymizer also using a nonrebreather  Medications: Reviewed on Rounds  Physical Exam:  Vitals: Temperature is 98.0 pulse 109 respiratory 18 blood pressure is 140/56 saturations 96%  Ventilator Settings on 10 L nonrebreather  . General: Comfortable at this time . Eyes: Grossly normal lids, irises & conjunctiva . ENT: grossly tongue is normal . Neck: no obvious mass . Cardiovascular: S1 S2 normal no gallop . Respiratory: No rhonchi no rales are noted at this time . Abdomen: soft . Skin: no rash seen on limited exam . Musculoskeletal: not rigid . Psychiatric:unable to assess . Neurologic: no seizure no involuntary movements         Lab Data:   Basic Metabolic Panel: Recent Labs  Lab 10/19/20 0654 10/20/20 0430 10/22/20 0427 10/23/20 0704  NA 142  --  141  --   K 3.1* 3.6 2.9* 4.3  CL 102  --  103  --   CO2 28  --  28  --   GLUCOSE 118*  --  119*  --   BUN 26*  --  19  --   CREATININE 0.60  --  0.55  --   CALCIUM 10.2  --  9.8  --     ABG: No results for input(s): PHART, PCO2ART, PO2ART, HCO3, O2SAT in the last 168 hours.  Liver Function Tests: No results for input(s): AST, ALT, ALKPHOS, BILITOT, PROT, ALBUMIN in the last 168 hours. No results for input(s): LIPASE, AMYLASE in the last 168 hours. No results for input(s): AMMONIA in the last 168 hours.  CBC: Recent Labs  Lab 10/19/20 0654 10/22/20 0427  WBC 7.4 8.4  HGB 15.2* 14.6  HCT 45.3 43.8  MCV 89.9 89.9  PLT 231 218    Cardiac Enzymes: No results for input(s): CKTOTAL, CKMB, CKMBINDEX, TROPONINI in the last 168 hours.  BNP (last 3 results) Recent Labs     09/11/20 1339 10/21/20 1206  BNP 69.6 80.7    ProBNP (last 3 results) No results for input(s): PROBNP in the last 8760 hours.  Radiological Exams: No results found.  Assessment/Plan Active Problems:   Acute on chronic respiratory failure with hypoxia (HCC)   COVID-19 virus infection   Severe sepsis (HCC)   Chronic heart failure with preserved ejection fraction ([place])   1. Acute on chronic respiratory failure hypoxia oxygen requirements still remain very high.  We will continue to monitor along closely.  Try to titrate oxygen down however has not been of much success. 2. COVID-19 virus infection recovery we will continue to follow along 3. Severe sepsis treated in resolution. 4. Chronic heart failure preserved ejection fraction patient is at baseline   I have personally seen and evaluated the patient, evaluated laboratory and imaging results, formulated the assessment and plan and placed orders. The Patient requires high complexity decision making with multiple systems involvement.  Rounds were done with the Respiratory Therapy Director and Staff therapists and discussed with nursing staff also.  Allyne Gee, MD [facility] Pulmonary Critical Care Medicine Sleep Medicine

## 2020-10-26 LAB — BASIC METABOLIC PANEL
Anion gap: 9 (ref 5–15)
BUN: 28 mg/dL — ABNORMAL HIGH (ref 8–23)
CO2: 33 mmol/L — ABNORMAL HIGH (ref 22–32)
Calcium: 9.9 mg/dL (ref 8.9–10.3)
Chloride: 98 mmol/L (ref 98–111)
Creatinine, Ser: 0.58 mg/dL (ref 0.44–1.00)
GFR, Estimated: >60 mL/min (ref >60)
Glucose, Bld: 220 mg/dL — ABNORMAL HIGH (ref 70–99)
Potassium: 3.2 mmol/L — ABNORMAL LOW (ref 3.5–5.1)
Sodium: 140 mmol/L (ref 135–145)

## 2020-10-26 LAB — CBC
HCT: 41.4 % (ref 36.0–46.0)
Hemoglobin: 13.8 g/dL (ref 12.0–15.0)
MCH: 29.8 pg (ref 26.0–34.0)
MCHC: 33.3 g/dL (ref 30.0–36.0)
MCV: 89.4 fL (ref 80.0–100.0)
Platelets: 224 10*3/uL (ref 150–400)
RBC: 4.63 MIL/uL (ref 3.87–5.11)
RDW: 19.5 % — ABNORMAL HIGH (ref 11.5–15.5)
WBC: 4.2 10*3/uL (ref 4.0–10.5)
nRBC: 0.5 % — ABNORMAL HIGH (ref 0.0–0.2)

## 2020-10-26 NOTE — Progress Notes (Signed)
Pulmonary Critical Care Medicine [place]   PULMONARY CRITICAL CARE SERVICE  PROGRESS NOTE     [name]  [id number]  DOB: 09-28-1950   DOA: 09/23/2020  Referring Physician: Merton Border, MD  HPI: [name] is a 70 y.o. female seen for follow up of Acute on Chronic Respiratory Failure.  Patient is on nonrebreather really showing no improvement also requiring 9 L Oxymizer  Medications: Reviewed on Rounds  Physical Exam:  Vitals: Temperature is 97.0 pulse 88 respiratory rate 16 blood pressure is 159/96 saturations 97%  Ventilator Settings off the ventilator on 9 L Oxymizer nonrebreather  . General: Comfortable at this time . Eyes: Grossly normal lids, irises & conjunctiva . ENT: grossly tongue is normal . Neck: no obvious mass . Cardiovascular: S1 S2 normal no gallop . Respiratory: Scattered rhonchi expansion is equal . Abdomen: soft . Skin: no rash seen on limited exam . Musculoskeletal: not rigid . Psychiatric:unable to assess . Neurologic: no seizure no involuntary movements         Lab Data:   Basic Metabolic Panel: Recent Labs  Lab 10/20/20 0430 10/22/20 0427 10/23/20 0704 10/26/20 0710  NA  --  141  --  140  K 3.6 2.9* 4.3 3.2*  CL  --  103  --  98  CO2  --  28  --  33*  GLUCOSE  --  119*  --  220*  BUN  --  19  --  28*  CREATININE  --  0.55  --  0.58  CALCIUM  --  9.8  --  9.9    ABG: No results for input(s): PHART, PCO2ART, PO2ART, HCO3, O2SAT in the last 168 hours.  Liver Function Tests: No results for input(s): AST, ALT, ALKPHOS, BILITOT, PROT, ALBUMIN in the last 168 hours. No results for input(s): LIPASE, AMYLASE in the last 168 hours. No results for input(s): AMMONIA in the last 168 hours.  CBC: Recent Labs  Lab 10/22/20 0427 10/26/20 0710  WBC 8.4 4.2  HGB 14.6 13.8  HCT 43.8 41.4  MCV 89.9 89.4  PLT 218 224    Cardiac Enzymes: No results for input(s): CKTOTAL, CKMB, CKMBINDEX, TROPONINI in  the last 168 hours.  BNP (last 3 results) Recent Labs    09/11/20 1339 10/21/20 1206  BNP 69.6 80.7    ProBNP (last 3 results) No results for input(s): PROBNP in the last 8760 hours.  Radiological Exams: No results found.  Assessment/Plan Active Problems:   Acute on chronic respiratory failure with hypoxia (HCC)   COVID-19 virus infection   Severe sepsis (HCC)   Chronic heart failure with preserved ejection fraction ([place])   1. Acute on chronic respiratory failure with hypoxia we will continue to try to titrate oxygen down however patient's not been doing very well 2. COVID-19 virus infection recovery we will continue to follow 3. Severe sepsis treated resolved 4. Chronic heart failure preserved ejection fraction no change we will continue to monitor closely   I have personally seen and evaluated the patient, evaluated laboratory and imaging results, formulated the assessment and plan and placed orders. The Patient requires high complexity decision making with multiple systems involvement.  Rounds were done with the Respiratory Therapy Director and Staff therapists and discussed with nursing staff also.  Allyne Gee, MD [facility] Pulmonary Critical Care Medicine Sleep Medicine

## 2020-10-27 ENCOUNTER — Ambulatory Visit: Payer: Medicare HMO | Admitting: Cardiovascular Disease

## 2020-10-27 NOTE — Progress Notes (Signed)
Pulmonary Critical Care Medicine [place]   PULMONARY CRITICAL CARE SERVICE  PROGRESS NOTE     [name]  [id number]  DOB: 02-08-1951   DOA: 09/23/2020  Referring Physician: Merton Border, MD  HPI: [name] is a 70 y.o. female seen for follow up of Acute on Chronic Respiratory Failure.  Patient is still requiring 100% nonrebreather mask because she desaturates quite easily right now is on 8 L Oxymizer along with a nonrebreather  Medications: Reviewed on Rounds  Physical Exam:  Vitals: Temperature 97.2 pulse 92 respiratory 28 blood pressure is 158/56 saturations 95%  Ventilator Settings on 8 L Oxymizer with nonrebreather  . General: Comfortable at this time . Eyes: Grossly normal lids, irises & conjunctiva . ENT: grossly tongue is normal . Neck: no obvious mass . Cardiovascular: S1 S2 normal no gallop . Respiratory: No rhonchi no rales are noted at this time . Abdomen: soft . Skin: no rash seen on limited exam . Musculoskeletal: not rigid . Psychiatric:unable to assess . Neurologic: no seizure no involuntary movements         Lab Data:   Basic Metabolic Panel: Recent Labs  Lab 10/22/20 0427 10/23/20 0704 10/26/20 0710  NA 141  --  140  K 2.9* 4.3 3.2*  CL 103  --  98  CO2 28  --  33*  GLUCOSE 119*  --  220*  BUN 19  --  28*  CREATININE 0.55  --  0.58  CALCIUM 9.8  --  9.9    ABG: No results for input(s): PHART, PCO2ART, PO2ART, HCO3, O2SAT in the last 168 hours.  Liver Function Tests: No results for input(s): AST, ALT, ALKPHOS, BILITOT, PROT, ALBUMIN in the last 168 hours. No results for input(s): LIPASE, AMYLASE in the last 168 hours. No results for input(s): AMMONIA in the last 168 hours.  CBC: Recent Labs  Lab 10/22/20 0427 10/26/20 0710  WBC 8.4 4.2  HGB 14.6 13.8  HCT 43.8 41.4  MCV 89.9 89.4  PLT 218 224    Cardiac Enzymes: No results for input(s): CKTOTAL, CKMB, CKMBINDEX, TROPONINI in the last 168  hours.  BNP (last 3 results) Recent Labs    09/11/20 1339 10/21/20 1206  BNP 69.6 80.7    ProBNP (last 3 results) No results for input(s): PROBNP in the last 8760 hours.  Radiological Exams: No results found.  Assessment/Plan Active Problems:   Acute on chronic respiratory failure with hypoxia (HCC)   COVID-19 virus infection   Severe sepsis (HCC)   Chronic heart failure with preserved ejection fraction ([place])   1. Acute on chronic respiratory failure hypoxia plan is to continue with trying to titrate the oxygen down which the patient is not doing very well with. 2. COVID-19 virus infection in recovery 3. Severe sepsis treated in resolution 4. Chronic heart failure preserved ejection fraction no change we will continue to monitor closely   I have personally seen and evaluated the patient, evaluated laboratory and imaging results, formulated the assessment and plan and placed orders. The Patient requires high complexity decision making with multiple systems involvement.  Rounds were done with the Respiratory Therapy Director and Staff therapists and discussed with nursing staff also.  Allyne Gee, MD [facility] [place] Pulmonary Critical Care Medicine Sleep Medicine

## 2020-10-28 NOTE — Progress Notes (Signed)
Pulmonary Critical Care Medicine [place]   PULMONARY CRITICAL CARE SERVICE  PROGRESS NOTE     [name]  [id number]  DOB: August 14, 1950   DOA: 09/23/2020  Referring Physician: Merton Border, MD  HPI: [name] is a 70 y.o. female seen for follow up of Acute on Chronic Respiratory Failure.  Patient is on 8 L Oxymizer with nonrebreather has really had no improvement with oxygen requirements  Medications: Reviewed on Rounds  Physical Exam:  Vitals: Temperature is 96.1 pulse 72 respiratory rate is 20 blood pressure 131/78 saturations 96%  Ventilator Settings on 8 L Oxymizer at this time with a nonrebreather  . General: Comfortable at this time . Eyes: Grossly normal lids, irises & conjunctiva . ENT: grossly tongue is normal . Neck: no obvious mass . Cardiovascular: S1 S2 normal no gallop . Respiratory: Scattered coarse rhonchi are noted . Abdomen: soft . Skin: no rash seen on limited exam . Musculoskeletal: not rigid . Psychiatric:unable to assess . Neurologic: no seizure no involuntary movements         Lab Data:   Basic Metabolic Panel: Recent Labs  Lab 10/22/20 0427 10/23/20 0704 10/26/20 0710  NA 141  --  140  K 2.9* 4.3 3.2*  CL 103  --  98  CO2 28  --  33*  GLUCOSE 119*  --  220*  BUN 19  --  28*  CREATININE 0.55  --  0.58  CALCIUM 9.8  --  9.9    ABG: No results for input(s): PHART, PCO2ART, PO2ART, HCO3, O2SAT in the last 168 hours.  Liver Function Tests: No results for input(s): AST, ALT, ALKPHOS, BILITOT, PROT, ALBUMIN in the last 168 hours. No results for input(s): LIPASE, AMYLASE in the last 168 hours. No results for input(s): AMMONIA in the last 168 hours.  CBC: Recent Labs  Lab 10/22/20 0427 10/26/20 0710  WBC 8.4 4.2  HGB 14.6 13.8  HCT 43.8 41.4  MCV 89.9 89.4  PLT 218 224    Cardiac Enzymes: No results for input(s): CKTOTAL, CKMB, CKMBINDEX, TROPONINI in the last 168 hours.  BNP (last 3  results) Recent Labs    09/11/20 1339 10/21/20 1206  BNP 69.6 80.7    ProBNP (last 3 results) No results for input(s): PROBNP in the last 8760 hours.  Radiological Exams: No results found.  Assessment/Plan Active Problems:   Acute on chronic respiratory failure with hypoxia (HCC)   COVID-19 virus infection   Severe sepsis (HCC)   Chronic heart failure with preserved ejection fraction ([place])   1. Acute on chronic respiratory failure with hypoxia again we will continue to try to titrate oxygen down which the patient has not been tolerating quite well. 2. COVID-19 virus infection recovery phase we will continue to follow along 3. Severe sepsis treated with resolution 4. Chronic heart failure preserved ejection fraction we will continue to follow along closely   I have personally seen and evaluated the patient, evaluated laboratory and imaging results, formulated the assessment and plan and placed orders. The Patient requires high complexity decision making with multiple systems involvement.  Rounds were done with the Respiratory Therapy Director and Staff therapists and discussed with nursing staff also.  Allyne Gee, MD [facility] Pulmonary Critical Care Medicine Sleep Medicine

## 2020-10-29 NOTE — Progress Notes (Signed)
Pulmonary Critical Care Medicine [place]   PULMONARY CRITICAL CARE SERVICE  PROGRESS NOTE     [name]  [id number]  DOB: Dec 26, 1950   DOA: 09/23/2020  Referring Physician: Merton Border, MD  HPI: [name] is a 69 y.o. female seen for follow up of Acute on Chronic Respiratory Failure.  Patient currently is on 5 L Oxymizer also using the nonrebreather  Medications: Reviewed on Rounds  Physical Exam:  Vitals: Temperature is 96.9 pulse 82 respiratory rate is 29 blood pressure is 150/68 saturations 94%  Ventilator Settings on 5 L nonrebreather  . General: Comfortable at this time . Eyes: Grossly normal lids, irises & conjunctiva . ENT: grossly tongue is normal . Neck: no obvious mass . Cardiovascular: S1 S2 normal no gallop . Respiratory: No rhonchi no rales are noted at this time . Abdomen: soft . Skin: no rash seen on limited exam . Musculoskeletal: not rigid . Psychiatric:unable to assess . Neurologic: no seizure no involuntary movements         Lab Data:   Basic Metabolic Panel: Recent Labs  Lab 10/23/20 0704 10/26/20 0710  NA  --  140  K 4.3 3.2*  CL  --  98  CO2  --  33*  GLUCOSE  --  220*  BUN  --  28*  CREATININE  --  0.58  CALCIUM  --  9.9    ABG: No results for input(s): PHART, PCO2ART, PO2ART, HCO3, O2SAT in the last 168 hours.  Liver Function Tests: No results for input(s): AST, ALT, ALKPHOS, BILITOT, PROT, ALBUMIN in the last 168 hours. No results for input(s): LIPASE, AMYLASE in the last 168 hours. No results for input(s): AMMONIA in the last 168 hours.  CBC: Recent Labs  Lab 10/26/20 0710  WBC 4.2  HGB 13.8  HCT 41.4  MCV 89.4  PLT 224    Cardiac Enzymes: No results for input(s): CKTOTAL, CKMB, CKMBINDEX, TROPONINI in the last 168 hours.  BNP (last 3 results) Recent Labs    09/11/20 1339 10/21/20 1206  BNP 69.6 80.7    ProBNP (last 3 results) No results for input(s): PROBNP in the last  8760 hours.  Radiological Exams: No results found.  Assessment/Plan Active Problems:   Acute on chronic respiratory failure with hypoxia (HCC)   COVID-19 virus infection   Severe sepsis (HCC)   Chronic heart failure with preserved ejection fraction ([place])   1. Acute on chronic respiratory failure hypoxia we will continue with trying to titrate the oxygen down however patient is still requiring nonrebreather mask 2. COVID-19 virus infection recovery we will continue to monitor closely. 3. Severe sepsis treated in resolution 4. Chronic heart failure preserved ejection fraction appears to be compensated   I have personally seen and evaluated the patient, evaluated laboratory and imaging results, formulated the assessment and plan and placed orders. The Patient requires high complexity decision making with multiple systems involvement.  Rounds were done with the Respiratory Therapy Director and Staff therapists and discussed with nursing staff also.  Allyne Gee, MD [facility] Pulmonary Critical Care Medicine Sleep Medicine

## 2020-10-30 LAB — POTASSIUM: Potassium: 4.6 mmol/L (ref 3.5–5.1)

## 2020-10-30 NOTE — Progress Notes (Signed)
Pulmonary Critical Care Medicine [place]   PULMONARY CRITICAL CARE SERVICE  PROGRESS NOTE     [name]  [id number]  DOB: March 31, 1951   DOA: 09/23/2020  Referring Physician: Merton Border, MD  HPI: [name] is a 70 y.o. female seen for follow up of Acute on Chronic Respiratory Failure.  She continues to have severe desaturation down into the 70s with nasal cannula.  She is still requiring the nonrebreather  Medications: Reviewed on Rounds  Physical Exam:  Vitals: Temperature is 97.9 pulse is 83 respiratory 25 blood pressure 106/84 saturations 94%  Ventilator Settings off the ventilator on Oxymizer plus nonrebreather  . General: Comfortable at this time . Eyes: Grossly normal lids, irises & conjunctiva . ENT: grossly tongue is normal . Neck: no obvious mass . Cardiovascular: S1 S2 normal no gallop . Respiratory: No rhonchi very coarse breath sounds . Abdomen: soft . Skin: no rash seen on limited exam . Musculoskeletal: not rigid . Psychiatric:unable to assess . Neurologic: no seizure no involuntary movements         Lab Data:   Basic Metabolic Panel: Recent Labs  Lab 10/26/20 0710 10/30/20 0837  NA 140  --   K 3.2* 4.6  CL 98  --   CO2 33*  --   GLUCOSE 220*  --   BUN 28*  --   CREATININE 0.58  --   CALCIUM 9.9  --     ABG: No results for input(s): PHART, PCO2ART, PO2ART, HCO3, O2SAT in the last 168 hours.  Liver Function Tests: No results for input(s): AST, ALT, ALKPHOS, BILITOT, PROT, ALBUMIN in the last 168 hours. No results for input(s): LIPASE, AMYLASE in the last 168 hours. No results for input(s): AMMONIA in the last 168 hours.  CBC: Recent Labs  Lab 10/26/20 0710  WBC 4.2  HGB 13.8  HCT 41.4  MCV 89.4  PLT 224    Cardiac Enzymes: No results for input(s): CKTOTAL, CKMB, CKMBINDEX, TROPONINI in the last 168 hours.  BNP (last 3 results) Recent Labs    09/11/20 1339 10/21/20 1206  BNP 69.6 80.7     ProBNP (last 3 results) No results for input(s): PROBNP in the last 8760 hours.  Radiological Exams: No results found.  Assessment/Plan Active Problems:   Acute on chronic respiratory failure with hypoxia (HCC)   COVID-19 virus infection   Severe sepsis (HCC)   Chronic heart failure with preserved ejection fraction ([place])   1. Acute on chronic respiratory failure with hypoxia plan is going to be to continue with oxygen therapy.  The family may be considering comfort measures 2. COVID-19 virus infection in recovery 3. Severe sepsis treated and resolved 4. Chronic heart failure preserved ejection fraction prognosis guarded   I have personally seen and evaluated the patient, evaluated laboratory and imaging results, formulated the assessment and plan and placed orders. The Patient requires high complexity decision making with multiple systems involvement.  Rounds were done with the Respiratory Therapy Director and Staff therapists and discussed with nursing staff also.  Allyne Gee, MD [facility] Pulmonary Critical Care Medicine Sleep Medicine

## 2020-10-31 LAB — OCCULT BLOOD X 1 CARD TO LAB, STOOL: Fecal Occult Bld: POSITIVE — AB

## 2020-11-01 LAB — URINALYSIS, ROUTINE W REFLEX MICROSCOPIC
Bacteria, UA: NONE SEEN
Bilirubin Urine: NEGATIVE
Glucose, UA: NEGATIVE mg/dL
Hgb urine dipstick: NEGATIVE
Ketones, ur: NEGATIVE mg/dL
Nitrite: POSITIVE — AB
Protein, ur: NEGATIVE mg/dL
Specific Gravity, Urine: 1.015 (ref 1.005–1.030)
pH: 7 (ref 5.0–8.0)

## 2020-11-01 LAB — BLOOD GAS, ARTERIAL
Acid-Base Excess: 5.8 mmol/L — ABNORMAL HIGH (ref 0.0–2.0)
Bicarbonate: 30.4 mmol/L — ABNORMAL HIGH (ref 20.0–28.0)
FIO2: 60
O2 Saturation: 92 %
Patient temperature: 36.6
pCO2 arterial: 48.4 mmHg — ABNORMAL HIGH (ref 32.0–48.0)
pH, Arterial: 7.413 (ref 7.350–7.450)
pO2, Arterial: 60.8 mmHg — ABNORMAL LOW (ref 83.0–108.0)

## 2020-11-01 LAB — CBC
HCT: 45.9 % (ref 36.0–46.0)
Hemoglobin: 15 g/dL (ref 12.0–15.0)
MCH: 29.9 pg (ref 26.0–34.0)
MCHC: 32.7 g/dL (ref 30.0–36.0)
MCV: 91.6 fL (ref 80.0–100.0)
Platelets: 251 10*3/uL (ref 150–400)
RBC: 5.01 MIL/uL (ref 3.87–5.11)
RDW: 18.6 % — ABNORMAL HIGH (ref 11.5–15.5)
WBC: 12 10*3/uL — ABNORMAL HIGH (ref 4.0–10.5)
nRBC: 0.2 % (ref 0.0–0.2)

## 2020-11-02 LAB — BASIC METABOLIC PANEL
Anion gap: 11 (ref 5–15)
BUN: 38 mg/dL — ABNORMAL HIGH (ref 8–23)
CO2: 32 mmol/L (ref 22–32)
Calcium: 10.3 mg/dL (ref 8.9–10.3)
Chloride: 97 mmol/L — ABNORMAL LOW (ref 98–111)
Creatinine, Ser: 0.59 mg/dL (ref 0.44–1.00)
GFR, Estimated: >60 mL/min (ref >60)
Glucose, Bld: 227 mg/dL — ABNORMAL HIGH (ref 70–99)
Potassium: 4.2 mmol/L (ref 3.5–5.1)
Sodium: 140 mmol/L (ref 135–145)

## 2020-11-02 LAB — CBC
HCT: 48.6 % — ABNORMAL HIGH (ref 36.0–46.0)
Hemoglobin: 15.7 g/dL — ABNORMAL HIGH (ref 12.0–15.0)
MCH: 29.7 pg (ref 26.0–34.0)
MCHC: 32.3 g/dL (ref 30.0–36.0)
MCV: 91.9 fL (ref 80.0–100.0)
Platelets: 221 10*3/uL (ref 150–400)
RBC: 5.29 MIL/uL — ABNORMAL HIGH (ref 3.87–5.11)
RDW: 18.6 % — ABNORMAL HIGH (ref 11.5–15.5)
WBC: 11.1 10*3/uL — ABNORMAL HIGH (ref 4.0–10.5)
nRBC: 0 % (ref 0.0–0.2)

## 2020-11-04 LAB — URINE CULTURE
Culture: >=100000 — AB
Special Requests: NORMAL

## 2020-11-04 NOTE — Progress Notes (Signed)
Pulmonary Critical Care Medicine [place]   PULMONARY CRITICAL CARE SERVICE  PROGRESS NOTE     [name]  YCX:[id number]  DOB: 1951-06-19   DOA: 09/23/2020  Referring Physician: Merton Border, MD  HPI: [name] is a 70 y.o. female seen for follow up of Acute on Chronic Respiratory Failure.  She is on Oxymizer right now on 6 L still has significant desaturations noted  Medications: Reviewed on Rounds  Physical Exam:  Vitals: Temperature is 97.4 pulse 88 respiratory rate 23 blood pressure 139/82 saturations 96%  Ventilator Settings off ventilator on Oxymizer  . General: Comfortable at this time . Eyes: Grossly normal lids, irises & conjunctiva . ENT: grossly tongue is normal . Neck: no obvious mass . Cardiovascular: S1 S2 normal no gallop . Respiratory: Scattered rhonchi expansion is equal . Abdomen: soft . Skin: no rash seen on limited exam . Musculoskeletal: not rigid . Psychiatric:unable to assess . Neurologic: no seizure no involuntary movements         Lab Data:   Basic Metabolic Panel: Recent Labs  Lab 10/30/20 0837 11/02/20 0757  NA  --  140  K 4.6 4.2  CL  --  97*  CO2  --  32  GLUCOSE  --  227*  BUN  --  38*  CREATININE  --  0.59  CALCIUM  --  10.3    ABG: Recent Labs  Lab 11/01/20 0614  PHART 7.413  PCO2ART 48.4*  PO2ART 60.8*  HCO3 30.4*  O2SAT 92.0    Liver Function Tests: No results for input(s): AST, ALT, ALKPHOS, BILITOT, PROT, ALBUMIN in the last 168 hours. No results for input(s): LIPASE, AMYLASE in the last 168 hours. No results for input(s): AMMONIA in the last 168 hours.  CBC: Recent Labs  Lab 11/01/20 0328 11/02/20 0757  WBC 12.0* 11.1*  HGB 15.0 15.7*  HCT 45.9 48.6*  MCV 91.6 91.9  PLT 251 221    Cardiac Enzymes: No results for input(s): CKTOTAL, CKMB, CKMBINDEX, TROPONINI in the last 168 hours.  BNP (last 3 results) Recent Labs    09/11/20 1339 10/21/20 1206  BNP 69.6 80.7     ProBNP (last 3 results) No results for input(s): PROBNP in the last 8760 hours.  Radiological Exams: No results found.  Assessment/Plan Active Problems:   Acute on chronic respiratory failure with hypoxia (HCC)   COVID-19 virus infection   Severe sepsis (HCC)   Chronic heart failure with preserved ejection fraction ([place])   1. Acute on chronic respiratory failure with hypoxia we will continue with the on oxygen try to titrate down as tolerated. 2. COVID-19 virus infection in recovery we will continue to monitor 3. Severe sepsis resolving 4. Chronic heart failure preserved ejection fraction supportive care monitor fluid status   I have personally seen and evaluated the patient, evaluated laboratory and imaging results, formulated the assessment and plan and placed orders. The Patient requires high complexity decision making with multiple systems involvement.  Rounds were done with the Respiratory Therapy Director and Staff therapists and discussed with nursing staff also.  Allyne Gee, MD [facility] Pulmonary Critical Care Medicine Sleep Medicine

## 2020-11-05 NOTE — Progress Notes (Signed)
Pulmonary Critical Care Medicine [place]   PULMONARY CRITICAL CARE SERVICE  PROGRESS NOTE     [name]  [id number]  DOB: [birth year]   DOA: 09/23/2020  Referring Physician: Merton Border, MD  HPI: [name] is a 70 y.o. female seen for follow up of Acute on Chronic Respiratory Failure.  Patient right now is on 7 L Oxymizer also is requiring the nonrebreather because of severe oxygen desaturations with minimal activity  Medications: Reviewed on Rounds  Physical Exam:  Vitals: Temperature is 97.0 pulse 78 respiratory 17 blood pressure is 113/64 saturations 98%  Ventilator Settings on 7 L Oxymizer with a nonrebreather  . General: Comfortable at this time . Eyes: Grossly normal lids, irises & conjunctiva . ENT: grossly tongue is normal . Neck: no obvious mass . Cardiovascular: S1 S2 normal no gallop . Respiratory: No rhonchi very coarse breath sounds . Abdomen: soft . Skin: no rash seen on limited exam . Musculoskeletal: not rigid . Psychiatric:unable to assess . Neurologic: no seizure no involuntary movements         Lab Data:   Basic Metabolic Panel: Recent Labs  Lab 10/30/20 0837 11/02/20 0757  NA  --  140  K 4.6 4.2  CL  --  97*  CO2  --  32  GLUCOSE  --  227*  BUN  --  38*  CREATININE  --  0.59  CALCIUM  --  10.3    ABG: Recent Labs  Lab 11/01/20 0614  PHART 7.413  PCO2ART 48.4*  PO2ART 60.8*  HCO3 30.4*  O2SAT 92.0    Liver Function Tests: No results for input(s): AST, ALT, ALKPHOS, BILITOT, PROT, ALBUMIN in the last 168 hours. No results for input(s): LIPASE, AMYLASE in the last 168 hours. No results for input(s): AMMONIA in the last 168 hours.  CBC: Recent Labs  Lab 11/01/20 0328 11/02/20 0757  WBC 12.0* 11.1*  HGB 15.0 15.7*  HCT 45.9 48.6*  MCV 91.6 91.9  PLT 251 221    Cardiac Enzymes: No results for input(s): CKTOTAL, CKMB, CKMBINDEX, TROPONINI in the last 168 hours.  BNP (last 3  results) Recent Labs    09/11/20 1339 10/21/20 1206  BNP 69.6 80.7    ProBNP (last 3 results) No results for input(s): PROBNP in the last 8760 hours.  Radiological Exams: No results found.  Assessment/Plan Active Problems:   Acute on chronic respiratory failure with hypoxia (HCC)   COVID-19 virus infection   Severe sepsis (HCC)   Chronic heart failure with preserved ejection fraction ([place])   1. Acute on chronic respiratory failure with hypoxia we will continue with oxygen therapy nonrebreather as necessary. 2. COVID-19 virus infection in recovery we will continue to follow 3. Severe sepsis treated in resolution 4. Chronic heart failure preserved ejection fraction we will continue to monitor closely   I have personally seen and evaluated the patient, evaluated laboratory and imaging results, formulated the assessment and plan and placed orders. The Patient requires high complexity decision making with multiple systems involvement.  Rounds were done with the Respiratory Therapy Director and Staff therapists and discussed with nursing staff also.  Allyne Gee, MD [facility] Pulmonary Critical Care Medicine Sleep Medicine

## 2020-11-06 LAB — BLOOD GAS, ARTERIAL
Acid-Base Excess: 5.4 mmol/L — ABNORMAL HIGH (ref 0.0–2.0)
Bicarbonate: 29.9 mmol/L — ABNORMAL HIGH (ref 20.0–28.0)
FIO2: 50
O2 Saturation: 90.3 %
Patient temperature: 36.3
pCO2 arterial: 46.9 mmHg (ref 32.0–48.0)
pH, Arterial: 7.417 (ref 7.350–7.450)
pO2, Arterial: 54.5 mmHg — ABNORMAL LOW (ref 83.0–108.0)

## 2020-11-06 NOTE — Progress Notes (Signed)
Pulmonary Critical Care Medicine [place]   PULMONARY CRITICAL CARE SERVICE  PROGRESS NOTE     [name]  [id number]  DOB: September 30, 1950   DOA: 09/23/2020  Referring Physician: Merton Border, MD  HPI: [name] is a 70 y.o. female seen for follow up of Acute on Chronic Respiratory Failure.  Patient right now is on 5 L Oxymizer with a nonrebreather continues to require high oxygen levels.  The plan is to look at the high flow that can be done at home  Medications: Reviewed on Rounds  Physical Exam:  Vitals: Temperature is 97.3 pulse 70 respiratory 25 blood pressure is 156/84 saturations 95%  Ventilator Settings on 5 L Oxymizer with nonrebreather  . General: Comfortable at this time . Eyes: Grossly normal lids, irises & conjunctiva . ENT: grossly tongue is normal . Neck: no obvious mass . Cardiovascular: S1 S2 normal no gallop . Respiratory: Scattered rhonchi noted bilaterally . Abdomen: soft . Skin: no rash seen on limited exam . Musculoskeletal: not rigid . Psychiatric:unable to assess . Neurologic: no seizure no involuntary movements         Lab Data:   Basic Metabolic Panel: Recent Labs  Lab 11/02/20 0757  NA 140  K 4.2  CL 97*  CO2 32  GLUCOSE 227*  BUN 38*  CREATININE 0.59  CALCIUM 10.3    ABG: Recent Labs  Lab 11/01/20 0614 11/06/20 0505  PHART 7.413 7.417  PCO2ART 48.4* 46.9  PO2ART 60.8* 54.5*  HCO3 30.4* 29.9*  O2SAT 92.0 90.3    Liver Function Tests: No results for input(s): AST, ALT, ALKPHOS, BILITOT, PROT, ALBUMIN in the last 168 hours. No results for input(s): LIPASE, AMYLASE in the last 168 hours. No results for input(s): AMMONIA in the last 168 hours.  CBC: Recent Labs  Lab 11/01/20 0328 11/02/20 0757  WBC 12.0* 11.1*  HGB 15.0 15.7*  HCT 45.9 48.6*  MCV 91.6 91.9  PLT 251 221    Cardiac Enzymes: No results for input(s): CKTOTAL, CKMB, CKMBINDEX, TROPONINI in the last 168 hours.  BNP  (last 3 results) Recent Labs    09/11/20 1339 10/21/20 1206  BNP 69.6 80.7    ProBNP (last 3 results) No results for input(s): PROBNP in the last 8760 hours.  Radiological Exams: No results found.  Assessment/Plan Active Problems:   Acute on chronic respiratory failure with hypoxia (HCC)   COVID-19 virus infection   Severe sepsis (HCC)   Chronic heart failure with preserved ejection fraction ([place])   1. Acute on chronic respiratory failure hypoxia plan is going to be to continue with the oxygen management as ordered.  Patient is being evaluated for possibility of using Aluisio ventilator high flow set up at home 2. COVID-19 virus infection in recovery phase prognosis is guarded 3. Severe sepsis resolved 4. Chronic heart failure preserved ejection fraction patient is at baseline   I have personally seen and evaluated the patient, evaluated laboratory and imaging results, formulated the assessment and plan and placed orders. The Patient requires high complexity decision making with multiple systems involvement.  Rounds were done with the Respiratory Therapy Director and Staff therapists and discussed with nursing staff also.  Allyne Gee, MD [facility] Pulmonary Critical Care Medicine Sleep Medicine

## 2020-11-07 NOTE — Progress Notes (Signed)
Pulmonary Critical Care Medicine [place]   PULMONARY CRITICAL CARE SERVICE  PROGRESS NOTE     [name]  [id number]  DOB: 1951/02/01   DOA: 09/23/2020  Referring Physician: Merton Border, MD  HPI: [name] is a 70 y.o. female seen for follow up of Acute on Chronic Respiratory Failure.  Patient currently is on 5 L using nonrebreather.  Medications: Reviewed on Rounds  Physical Exam:  Vitals: Temperature is 97.6 pulse 92 respiratory is 24 blood pressure is 158/91 saturations 95%  Ventilator Settings off the ventilator spontaneously breathing on 5 L nasal cannula with a nonrebreather  . General: Comfortable at this time . Eyes: Grossly normal lids, irises & conjunctiva . ENT: grossly tongue is normal . Neck: no obvious mass . Cardiovascular: S1 S2 normal no gallop . Respiratory: Scattered rhonchi coarse breath sounds . Abdomen: soft . Skin: no rash seen on limited exam . Musculoskeletal: not rigid . Psychiatric:unable to assess . Neurologic: no seizure no involuntary movements         Lab Data:   Basic Metabolic Panel: Recent Labs  Lab 11/02/20 0757  NA 140  K 4.2  CL 97*  CO2 32  GLUCOSE 227*  BUN 38*  CREATININE 0.59  CALCIUM 10.3    ABG: Recent Labs  Lab 11/01/20 0614 11/06/20 0505  PHART 7.413 7.417  PCO2ART 48.4* 46.9  PO2ART 60.8* 54.5*  HCO3 30.4* 29.9*  O2SAT 92.0 90.3    Liver Function Tests: No results for input(s): AST, ALT, ALKPHOS, BILITOT, PROT, ALBUMIN in the last 168 hours. No results for input(s): LIPASE, AMYLASE in the last 168 hours. No results for input(s): AMMONIA in the last 168 hours.  CBC: Recent Labs  Lab 11/01/20 0328 11/02/20 0757  WBC 12.0* 11.1*  HGB 15.0 15.7*  HCT 45.9 48.6*  MCV 91.6 91.9  PLT 251 221    Cardiac Enzymes: No results for input(s): CKTOTAL, CKMB, CKMBINDEX, TROPONINI in the last 168 hours.  BNP (last 3 results) Recent Labs    09/11/20 1339  10/21/20 1206  BNP 69.6 80.7    ProBNP (last 3 results) No results for input(s): PROBNP in the last 8760 hours.  Radiological Exams: No results found.  Assessment/Plan Active Problems:   Acute on chronic respiratory failure with hypoxia (HCC)   COVID-19 virus infection   Severe sepsis (HCC)   Chronic heart failure with preserved ejection fraction ([place])   1. Acute on chronic respiratory failure with hypoxia right now we will continue with oxygen therapy titrate as tolerated. 2. COVID-19 virus infection recovery we will continue with supportive care 3. Severe sepsis treated resolving 4. Chronic heart failure preserved ejection fraction no change   I have personally seen and evaluated the patient, evaluated laboratory and imaging results, formulated the assessment and plan and placed orders. The Patient requires high complexity decision making with multiple systems involvement.  Rounds were done with the Respiratory Therapy Director and Staff therapists and discussed with nursing staff also.  Allyne Gee, MD [facility] Pulmonary Critical Care Medicine Sleep Medicine

## 2020-11-09 ENCOUNTER — Encounter (HOSPITAL_BASED_OUTPATIENT_CLINIC_OR_DEPARTMENT_OTHER): Payer: Medicare HMO

## 2020-11-09 DIAGNOSIS — L538 Other specified erythematous conditions: Secondary | ICD-10-CM

## 2020-11-09 DIAGNOSIS — M7989 Other specified soft tissue disorders: Secondary | ICD-10-CM

## 2020-11-09 NOTE — Progress Notes (Signed)
Pulmonary Critical Care Medicine [place]   PULMONARY CRITICAL CARE SERVICE  PROGRESS NOTE     [name]  [id number]  DOB: Aug 12, 1950   DOA: 09/23/2020  Referring Physician: Merton Border, MD  HPI: [name] is a 70 y.o. female seen for follow up of Acute on Chronic Respiratory Failure.  Patient is comfortable right now without distress at this time.  Has been on 5 L nasal cannula with good saturation  Medications: Reviewed on Rounds  Physical Exam:  Vitals: Temperature is 97.5 pulse 96 respiratory rate 30 blood pressure is 161/91 saturations 96%  Ventilator Settings off the ventilator on 5 L nasal cannula  . General: Comfortable at this time . Eyes: Grossly normal lids, irises & conjunctiva . ENT: grossly tongue is normal . Neck: no obvious mass . Cardiovascular: S1 S2 normal no gallop . Respiratory: No rhonchi no rales are noted . Abdomen: soft . Skin: no rash seen on limited exam . Musculoskeletal: not rigid . Psychiatric:unable to assess . Neurologic: no seizure no involuntary movements         Lab Data:   Basic Metabolic Panel: No results for input(s): NA, K, CL, CO2, GLUCOSE, BUN, CREATININE, CALCIUM, MG, PHOS in the last 168 hours.  ABG: Recent Labs  Lab 11/06/20 0505  PHART 7.417  PCO2ART 46.9  PO2ART 54.5*  HCO3 29.9*  O2SAT 90.3    Liver Function Tests: No results for input(s): AST, ALT, ALKPHOS, BILITOT, PROT, ALBUMIN in the last 168 hours. No results for input(s): LIPASE, AMYLASE in the last 168 hours. No results for input(s): AMMONIA in the last 168 hours.  CBC: No results for input(s): WBC, NEUTROABS, HGB, HCT, MCV, PLT in the last 168 hours.  Cardiac Enzymes: No results for input(s): CKTOTAL, CKMB, CKMBINDEX, TROPONINI in the last 168 hours.  BNP (last 3 results) Recent Labs    09/11/20 1339 10/21/20 1206  BNP 69.6 80.7    ProBNP (last 3 results) No results for input(s): PROBNP in the last 8760  hours.  Radiological Exams: No results found.  Assessment/Plan Active Problems:   Acute on chronic respiratory failure with hypoxia (HCC)   COVID-19 virus infection   Severe sepsis (HCC)   Chronic heart failure with preserved ejection fraction ([place])   1. Acute on chronic respiratory failure hypoxia we will continue with oxygen therapy titrate as tolerated.  Continue pulmonary toilet supportive care 2. COVID-19 virus infection in recovery still significantly hypoxic 3. Severe sepsis resolved 4. Chronic heart failure preserved ejection fraction no change we will continue to monitor.   I have personally seen and evaluated the patient, evaluated laboratory and imaging results, formulated the assessment and plan and placed orders. The Patient requires high complexity decision making with multiple systems involvement.  Rounds were done with the Respiratory Therapy Director and Staff therapists and discussed with nursing staff also.  Allyne Gee, MD [facility] Pulmonary Critical Care Medicine Sleep Medicine

## 2020-11-09 NOTE — Progress Notes (Signed)
VASCULAR LAB    Right upper extremity venous duplex has been performed.  See CV proc for preliminary results.   Shakala Marlatt, RVT 11/09/2020, 11:29 AM

## 2020-11-10 LAB — BLOOD GAS, ARTERIAL
Acid-Base Excess: 3.5 mmol/L — ABNORMAL HIGH (ref 0.0–2.0)
Bicarbonate: 27.4 mmol/L (ref 20.0–28.0)
FIO2: 40
O2 Saturation: 96.5 %
Patient temperature: 35.6
pCO2 arterial: 38.1 mmHg (ref 32.0–48.0)
pH, Arterial: 7.464 — ABNORMAL HIGH (ref 7.350–7.450)
pO2, Arterial: 71.2 mmHg — ABNORMAL LOW (ref 83.0–108.0)

## 2020-11-10 LAB — BASIC METABOLIC PANEL
Anion gap: 8 (ref 5–15)
BUN: 26 mg/dL — ABNORMAL HIGH (ref 8–23)
CO2: 30 mmol/L (ref 22–32)
Calcium: 9.7 mg/dL (ref 8.9–10.3)
Chloride: 101 mmol/L (ref 98–111)
Creatinine, Ser: 0.86 mg/dL (ref 0.44–1.00)
GFR, Estimated: >60 mL/min (ref >60)
Glucose, Bld: 190 mg/dL — ABNORMAL HIGH (ref 70–99)
Potassium: 4.4 mmol/L (ref 3.5–5.1)
Sodium: 139 mmol/L (ref 135–145)

## 2020-11-10 LAB — CBC
HCT: 43.3 % (ref 36.0–46.0)
Hemoglobin: 14.3 g/dL (ref 12.0–15.0)
MCH: 30.1 pg (ref 26.0–34.0)
MCHC: 33 g/dL (ref 30.0–36.0)
MCV: 91.2 fL (ref 80.0–100.0)
Platelets: UNDETERMINED 10*3/uL (ref 150–400)
RBC: 4.75 MIL/uL (ref 3.87–5.11)
RDW: 19 % — ABNORMAL HIGH (ref 11.5–15.5)
WBC: 13.4 10*3/uL — ABNORMAL HIGH (ref 4.0–10.5)
nRBC: 0 % (ref 0.0–0.2)

## 2020-11-10 NOTE — Progress Notes (Signed)
Pulmonary Critical Care Medicine [place]   PULMONARY CRITICAL CARE SERVICE  PROGRESS NOTE     CONSTANTINA LASETER  DXI:[id number]  DOB: [birth year]   DOA: 09/23/2020  Referring Physician: Merton Border, MD  HPI: [name] is a 70 y.o. female seen for follow up of Acute on Chronic Respiratory Failure.  Patient is on 5 L nasal cannula little bit more somnolent getting a ABG done today  Medications: Reviewed on Rounds  Physical Exam:  Vitals: Temperature is 97.1 pulse 92 respiratory rate is 25 blood pressure is 145/70 saturations 95%  Ventilator Settings off the ventilator on 5 L nasal cannula  . General: Comfortable at this time . Eyes: Grossly normal lids, irises & conjunctiva . ENT: grossly tongue is normal . Neck: no obvious mass . Cardiovascular: S1 S2 normal no gallop . Respiratory: Scattered rhonchi expansion is equal . Abdomen: soft . Skin: no rash seen on limited exam . Musculoskeletal: not rigid . Psychiatric:unable to assess . Neurologic: no seizure no involuntary movements         Lab Data:   Basic Metabolic Panel: Recent Labs  Lab 11/10/20 0422  NA 139  K 4.4  CL 101  CO2 30  GLUCOSE 190*  BUN 26*  CREATININE 0.86  CALCIUM 9.7    ABG: Recent Labs  Lab 11/06/20 0505  PHART 7.417  PCO2ART 46.9  PO2ART 54.5*  HCO3 29.9*  O2SAT 90.3    Liver Function Tests: No results for input(s): AST, ALT, ALKPHOS, BILITOT, PROT, ALBUMIN in the last 168 hours. No results for input(s): LIPASE, AMYLASE in the last 168 hours. No results for input(s): AMMONIA in the last 168 hours.  CBC: Recent Labs  Lab 11/10/20 0422  WBC 13.4*  HGB 14.3  HCT 43.3  MCV 91.2  PLT PLATELET CLUMPS NOTED ON SMEAR, UNABLE TO ESTIMATE    Cardiac Enzymes: No results for input(s): CKTOTAL, CKMB, CKMBINDEX, TROPONINI in the last 168 hours.  BNP (last 3 results) Recent Labs    09/11/20 1339 10/21/20 1206  BNP 69.6 80.7    ProBNP (last 3  results) No results for input(s): PROBNP in the last 8760 hours.  Radiological Exams: VAS [place] UPPER EXTREMITY VENOUS DUPLEX  Result Date: 11/09/2020 UPPER VENOUS STUDY  Patient Name:  [name]  Date of Exam:   11/09/2020 Medical Rec #: [id number]        Accession #:    [id number] Date of Birth: 1951-06-04         Patient Gender: F Patient Age:   70Y Exam Location:  [facility] Procedure:      VAS [place] UPPER EXTREMITY VENOUS DUPLEX Referring Phys: [id number] ALI HIJAZI --------------------------------------------------------------------------------  Indications: Swelling, and Erythema Comparison Study: No prior study on file Performing Technologist: Sharion Dove RVS  Examination Guidelines: A complete evaluation includes B-mode imaging, spectral Doppler, color Doppler, and power Doppler as needed of all accessible portions of each vessel. Bilateral testing is considered an integral part of a complete examination. Limited examinations for reoccurring indications may be performed as noted.  Right Findings: +----------+------------+---------+-----------+----------+-------+ RIGHT     CompressiblePhasicitySpontaneousPropertiesSummary +----------+------------+---------+-----------+----------+-------+ IJV           Full       Yes       Yes                      +----------+------------+---------+-----------+----------+-------+ Subclavian  Yes       Yes                      +----------+------------+---------+-----------+----------+-------+ Axillary                 Yes       Yes                      +----------+------------+---------+-----------+----------+-------+ Brachial      Full                                          +----------+------------+---------+-----------+----------+-------+ Radial        Full                                          +----------+------------+---------+-----------+----------+-------+ Ulnar         Full                                           +----------+------------+---------+-----------+----------+-------+ Cephalic      Full                                          +----------+------------+---------+-----------+----------+-------+ Basilic       Full                                          +----------+------------+---------+-----------+----------+-------+  Left Findings: +----------+------------+---------+-----------+----------+-------+ LEFT      CompressiblePhasicitySpontaneousPropertiesSummary +----------+------------+---------+-----------+----------+-------+ Subclavian               Yes       Yes                      +----------+------------+---------+-----------+----------+-------+  Summary:  Right: No evidence of deep vein thrombosis in the upper extremity. No evidence of superficial vein thrombosis in the upper extremity.  Left: No evidence of thrombosis in the subclavian.  *See table(s) above for measurements and observations.  Diagnosing physician: Servando Snare MD Electronically signed by Servando Snare MD on 11/09/2020 at 2:58:49 PM.    Final     Assessment/Plan Active Problems:   Acute on chronic respiratory failure with hypoxia ([place])   COVID-19 virus infection   Severe sepsis ([place])   Chronic heart failure with preserved ejection fraction ([place])   1. Acute on chronic respiratory failure with hypoxia we will continue with oxygen therapy check ABG today.  Patient did have a Doppler done without any evidence of DVT 2. COVID-19 virus infection in recovery we will continue to follow along. 3. Severe sepsis treated we will continue to follow along 4. Chronic heart failure preserved ejection fraction   I have personally seen and evaluated the patient, evaluated laboratory and imaging results, formulated the assessment and plan and placed orders. The Patient requires high complexity decision making with multiple systems involvement.  Rounds were done with the Respiratory Therapy Director and  Staff therapists and discussed with nursing staff also.  Kayann Maj  Richardson Dopp, MD [name] Pulmonary Critical Care Medicine Sleep Medicine

## 2020-11-11 DIAGNOSIS — Z7189 Other specified counseling: Secondary | ICD-10-CM

## 2020-11-11 NOTE — Progress Notes (Signed)
Pulmonary Critical Care Medicine [place]   PULMONARY CRITICAL CARE SERVICE  PROGRESS NOTE     [name]  WLN:[id number]  DOB: [birth year]   DOA: 09/23/2020  Referring Physician: Merton Border, MD  HPI: [name] is a 70 y.o. female seen for follow up of Acute on Chronic Respiratory Failure.  Patient currently is afebrile without distress has been on 5 L oxygen  Medications: Reviewed on Rounds  Physical Exam:  Vitals: Temperature is 96.5 pulse 90 respiratory 18 blood pressure is 127/79 saturations 99%  Ventilator Settings on 5 L nasal cannula  . General: Comfortable at this time . Eyes: Grossly normal lids, irises & conjunctiva . ENT: grossly tongue is normal . Neck: no obvious mass . Cardiovascular: S1 S2 normal no gallop . Respiratory: Scattered rhonchi expansion is equal . Abdomen: soft . Skin: no rash seen on limited exam . Musculoskeletal: not rigid . Psychiatric:unable to assess . Neurologic: no seizure no involuntary movements         Lab Data:   Basic Metabolic Panel: Recent Labs  Lab 11/10/20 0422  NA 139  K 4.4  CL 101  CO2 30  GLUCOSE 190*  BUN 26*  CREATININE 0.86  CALCIUM 9.7    ABG: Recent Labs  Lab 11/06/20 0505 11/10/20 1415  PHART 7.417 7.464*  PCO2ART 46.9 38.1  PO2ART 54.5* 71.2*  HCO3 29.9* 27.4  O2SAT 90.3 96.5    Liver Function Tests: No results for input(s): AST, ALT, ALKPHOS, BILITOT, PROT, ALBUMIN in the last 168 hours. No results for input(s): LIPASE, AMYLASE in the last 168 hours. No results for input(s): AMMONIA in the last 168 hours.  CBC: Recent Labs  Lab 11/10/20 0422  WBC 13.4*  HGB 14.3  HCT 43.3  MCV 91.2  PLT PLATELET CLUMPS NOTED ON SMEAR, UNABLE TO ESTIMATE    Cardiac Enzymes: No results for input(s): CKTOTAL, CKMB, CKMBINDEX, TROPONINI in the last 168 hours.  BNP (last 3 results) Recent Labs    09/11/20 1339 10/21/20 1206  BNP 69.6 80.7    ProBNP (last 3  results) No results for input(s): PROBNP in the last 8760 hours.  Radiological Exams: No results found.  Assessment/Plan Active Problems:   Acute on chronic respiratory failure with hypoxia (HCC)   COVID-19 virus infection   Severe sepsis (HCC)   Chronic heart failure with preserved ejection fraction ([place])   1. Acute on chronic respiratory failure hypoxia the patient is currently on 5 L oxygen.  The patient's daughter was present in the room and I had a good discussion with her about goals of care and overall prognosis.  The patient as well as the daughter wants everything done at this point.  They really are not looking at hospice but rather they would like to go home and have home health work with them and do rehab.  I did reiterate the possibility that she may not be able to get the optimal home health set up and she may be better for set up in a facility however the daughter was fairly clear about wanting to take the patient home and she did also understand that there are no guarantees as to whether she will get better even at home. 2. COVID-19 virus infection in recovery phase plan is going to be to continue with supportive care 3. Severe sepsis has resolved 4. Chronic heart failure preserved ejection fraction no change we will continue to monitor closely.   I have personally seen  and evaluated the patient, evaluated laboratory and imaging results, formulated the assessment and plan and placed orders. The Patient requires high complexity decision making with multiple systems involvement.  Rounds were done with the Respiratory Therapy Director and Staff therapists and discussed with nursing staff also.  Allyne Gee, MD [name] Pulmonary Critical Care Medicine Sleep Medicine

## 2020-11-12 NOTE — Progress Notes (Signed)
Pulmonary Critical Care Medicine [place]   PULMONARY CRITICAL CARE SERVICE  PROGRESS NOTE     [name]  [id number]  DOB: 1950-12-13   DOA: 09/23/2020  Referring Physician: Merton Border, MD  HPI: [name] is a 70 y.o. female seen for follow up of Acute on Chronic Respiratory Failure.  Patient is currently on 5 L oxygen she is doing little bit better had a lengthy discussion with the family as noted previously  Medications: Reviewed on Rounds  Physical Exam:  Vitals: Temperature is 97.2 pulse 84 respiratory 30 blood pressure is 157/87 saturations 95%  Ventilator Settings off the ventilator on 5 L nasal cannula  . General: Comfortable at this time . Eyes: Grossly normal lids, irises & conjunctiva . ENT: grossly tongue is normal . Neck: no obvious mass . Cardiovascular: S1 S2 normal no gallop . Respiratory: No rhonchi very coarse percent . Abdomen: soft . Skin: no rash seen on limited exam . Musculoskeletal: not rigid . Psychiatric:unable to assess . Neurologic: no seizure no involuntary movements         Lab Data:   Basic Metabolic Panel: Recent Labs  Lab 11/10/20 0422  NA 139  K 4.4  CL 101  CO2 30  GLUCOSE 190*  BUN 26*  CREATININE 0.86  CALCIUM 9.7    ABG: Recent Labs  Lab 11/06/20 0505 11/10/20 1415  PHART 7.417 7.464*  PCO2ART 46.9 38.1  PO2ART 54.5* 71.2*  HCO3 29.9* 27.4  O2SAT 90.3 96.5    Liver Function Tests: No results for input(s): AST, ALT, ALKPHOS, BILITOT, PROT, ALBUMIN in the last 168 hours. No results for input(s): LIPASE, AMYLASE in the last 168 hours. No results for input(s): AMMONIA in the last 168 hours.  CBC: Recent Labs  Lab 11/10/20 0422  WBC 13.4*  HGB 14.3  HCT 43.3  MCV 91.2  PLT PLATELET CLUMPS NOTED ON SMEAR, UNABLE TO ESTIMATE    Cardiac Enzymes: No results for input(s): CKTOTAL, CKMB, CKMBINDEX, TROPONINI in the last 168 hours.  BNP (last 3 results) Recent Labs     09/11/20 1339 10/21/20 1206  BNP 69.6 80.7    ProBNP (last 3 results) No results for input(s): PROBNP in the last 8760 hours.  Radiological Exams: No results found.  Assessment/Plan Active Problems:   Acute on chronic respiratory failure with hypoxia (HCC)   COVID-19 virus infection   Severe sepsis (HCC)   Chronic heart failure with preserved ejection fraction ([place])   1. Acute on chronic respiratory failure with hypoxia plan is going to be to continue with oxygen therapy titrate as tolerated. 2. COVID-19 virus infection recovery phase we will continue to follow along. 3. Severe sepsis resolved 4. Chronic heart failure preserved ejection fraction we will continue with present management   I have personally seen and evaluated the patient, evaluated laboratory and imaging results, formulated the assessment and plan and placed orders. The Patient requires high complexity decision making with multiple systems involvement.  Rounds were done with the Respiratory Therapy Director and Staff therapists and discussed with nursing staff also.  Allyne Gee, MD [facility] Pulmonary Critical Care Medicine Sleep Medicine

## 2020-11-13 ENCOUNTER — Other Ambulatory Visit (HOSPITAL_COMMUNITY): Payer: Medicare HMO

## 2020-11-13 LAB — BASIC METABOLIC PANEL
Anion gap: 7 (ref 5–15)
BUN: 30 mg/dL — ABNORMAL HIGH (ref 8–23)
CO2: 30 mmol/L (ref 22–32)
Calcium: 9.9 mg/dL (ref 8.9–10.3)
Chloride: 103 mmol/L (ref 98–111)
Creatinine, Ser: 0.55 mg/dL (ref 0.44–1.00)
GFR, Estimated: >60 mL/min (ref >60)
Glucose, Bld: 165 mg/dL — ABNORMAL HIGH (ref 70–99)
Potassium: 4.5 mmol/L (ref 3.5–5.1)
Sodium: 140 mmol/L (ref 135–145)

## 2020-11-13 LAB — MAGNESIUM: Magnesium: 2 mg/dL (ref 1.7–2.4)

## 2020-11-13 NOTE — Progress Notes (Signed)
Pulmonary Critical Care Medicine [place]   PULMONARY CRITICAL CARE SERVICE  PROGRESS NOTE     [name]  [id number]  DOB: 1951/01/28   DOA: 09/23/2020  Referring Physician: Merton Border, MD  HPI: [name] is a 70 y.o. female seen for follow up of Acute on Chronic Respiratory Failure.  Patient currently is on 5 L oxygen  Medications: Reviewed on Rounds  Physical Exam:  Vitals: Temperature is 97.7 pulse 98 respiratory 22 blood pressure 150/96 saturations 90  Ventilator Settings patient is on 5 L oxygen good saturations are noted  . General: Comfortable at this time . Eyes: Grossly normal lids, irises & conjunctiva . ENT: grossly tongue is normal . Neck: no obvious mass . Cardiovascular: S1 S2 normal no gallop . Respiratory: Scattered rhonchi expansion is equal . Abdomen: soft . Skin: no rash seen on limited exam . Musculoskeletal: not rigid . Psychiatric:unable to assess . Neurologic: no seizure no involuntary movements         Lab Data:   Basic Metabolic Panel: Recent Labs  Lab 11/10/20 0422 11/13/20 0340  NA 139 140  K 4.4 4.5  CL 101 103  CO2 30 30  GLUCOSE 190* 165*  BUN 26* 30*  CREATININE 0.86 0.55  CALCIUM 9.7 9.9  MG  --  2.0    ABG: Recent Labs  Lab 11/10/20 1415  PHART 7.464*  PCO2ART 38.1  PO2ART 71.2*  HCO3 27.4  O2SAT 96.5    Liver Function Tests: No results for input(s): AST, ALT, ALKPHOS, BILITOT, PROT, ALBUMIN in the last 168 hours. No results for input(s): LIPASE, AMYLASE in the last 168 hours. No results for input(s): AMMONIA in the last 168 hours.  CBC: Recent Labs  Lab 11/10/20 0422  WBC 13.4*  HGB 14.3  HCT 43.3  MCV 91.2  PLT PLATELET CLUMPS NOTED ON SMEAR, UNABLE TO ESTIMATE    Cardiac Enzymes: No results for input(s): CKTOTAL, CKMB, CKMBINDEX, TROPONINI in the last 168 hours.  BNP (last 3 results) Recent Labs    09/11/20 1339 10/21/20 1206  BNP 69.6 80.7    ProBNP  (last 3 results) No results for input(s): PROBNP in the last 8760 hours.  Radiological Exams: No results found.  Assessment/Plan Active Problems:   Acute on chronic respiratory failure with hypoxia (HCC)   COVID-19 virus infection   Severe sepsis (HCC)   Chronic heart failure with preserved ejection fraction ([place])   1. Acute on chronic respiratory failure with hypoxia we will continue with 5 L oxygen encourage use of BiPAP at nighttime 2. COVID-19 virus infection recovery we will continue to follow along. 3. Severe sepsis treated resolving 4. Chronic heart failure preserved ejection fraction at baseline   I have personally seen and evaluated the patient, evaluated laboratory and imaging results, formulated the assessment and plan and placed orders. The Patient requires high complexity decision making with multiple systems involvement.  Rounds were done with the Respiratory Therapy Director and Staff therapists and discussed with nursing staff also.  Allyne Gee, MD [facility] Pulmonary Critical Care Medicine Sleep Medicine

## 2020-11-15 NOTE — Progress Notes (Signed)
Pulmonary Critical Care Medicine [place]   PULMONARY CRITICAL CARE SERVICE  PROGRESS NOTE     [name]  [id number]  DOB: September 23, 1950   DOA: 09/23/2020  Referring Physician: Merton Border, MD  HPI: [name] is a 70 y.o. female seen for follow up of Acute on Chronic Respiratory Failure.  No fevers noted patient's oxygen is back down to 5 L  Medications: Reviewed on Rounds  Physical Exam:  Vitals: Temperature is 96.4 pulse 99 respiratory 34 blood pressure is 161/92 saturations 92  Ventilator Settings off the ventilator on 5 L oxygen good saturations.  . General: Comfortable at this time . Eyes: Grossly normal lids, irises & conjunctiva . ENT: grossly tongue is normal . Neck: no obvious mass . Cardiovascular: S1 S2 normal no gallop . Respiratory: No rhonchi no rales are noted at this time. . Abdomen: soft . Skin: no rash seen on limited exam . Musculoskeletal: not rigid . Psychiatric:unable to assess . Neurologic: no seizure no involuntary movements         Lab Data:   Basic Metabolic Panel: Recent Labs  Lab 11/10/20 0422 11/13/20 0340  NA 139 140  K 4.4 4.5  CL 101 103  CO2 30 30  GLUCOSE 190* 165*  BUN 26* 30*  CREATININE 0.86 0.55  CALCIUM 9.7 9.9  MG  --  2.0    ABG: Recent Labs  Lab 11/10/20 1415  PHART 7.464*  PCO2ART 38.1  PO2ART 71.2*  HCO3 27.4  O2SAT 96.5    Liver Function Tests: No results for input(s): AST, ALT, ALKPHOS, BILITOT, PROT, ALBUMIN in the last 168 hours. No results for input(s): LIPASE, AMYLASE in the last 168 hours. No results for input(s): AMMONIA in the last 168 hours.  CBC: Recent Labs  Lab 11/10/20 0422  WBC 13.4*  HGB 14.3  HCT 43.3  MCV 91.2  PLT PLATELET CLUMPS NOTED ON SMEAR, UNABLE TO ESTIMATE    Cardiac Enzymes: No results for input(s): CKTOTAL, CKMB, CKMBINDEX, TROPONINI in the last 168 hours.  BNP (last 3 results) Recent Labs    09/11/20 1339 10/21/20 1206   BNP 69.6 80.7    ProBNP (last 3 results) No results for input(s): PROBNP in the last 8760 hours.  Radiological Exams: DG CHEST PORT 1 VIEW  Result Date: 11/13/2020 CLINICAL DATA:  Acute LEFT-sided chest pain. Recent COVID-19 pneumonia. EXAM: PORTABLE CHEST 1 VIEW COMPARISON:  10/16/2020 and earlier, including high-resolution CT chest 09/08/2020. FINDINGS: Cardiac silhouette normal in size for AP portable technique, unchanged. Patchy ground-glass airspace opacities in both lungs have improved since the exam 1 month ago, though reticular interstitial opacities associated with the low lung volumes persists. No new pulmonary parenchymal abnormalities. IMPRESSION: 1. Improved and likely resolution of the COVID-19 ground-glass pneumonia since the examination 1 month ago. 2. Persistent interstitial lung disease diffusely associated with low lung volumes raises the question of pulmonary fibrosis. A follow-up high-resolution chest CT is recommended in another 2 months in order to allow complete resolution of any superimposed infectious pneumonia. (This recommendation was made at the time of the high-resolution chest CT 09/08/2020). Electronically Signed   By: Evangeline Dakin M.D.   On: 11/13/2020 18:19    Assessment/Plan Active Problems:   Acute on chronic respiratory failure with hypoxia (HCC)   COVID-19 virus infection   Severe sepsis (HCC)   Chronic heart failure with preserved ejection fraction ([place])   1. Acute on chronic respiratory failure with hypoxia at this time patient  is on 5 L oxygen which will be continued titrate slowly. 2. COVID-19 virus infection in recovery 3. Severe sepsis treated improving 4. Chronic heart failure preserved ejection fraction   I have personally seen and evaluated the patient, evaluated laboratory and imaging results, formulated the assessment and plan and placed orders. The Patient requires high complexity decision making with multiple systems involvement.   Rounds were done with the Respiratory Therapy Director and Staff therapists and discussed with nursing staff also.  Allyne Gee, MD [facility] Pulmonary Critical Care Medicine Sleep Medicine

## 2020-11-16 NOTE — Progress Notes (Signed)
Pulmonary Critical Care Medicine [place]   PULMONARY CRITICAL CARE SERVICE  PROGRESS NOTE     [name]  [id number]  DOB: [birth year]   DOA: 09/23/2020  Referring Physician: Merton Border, MD  HPI: [name] is a 70 y.o. female seen for follow up of Acute on Chronic Respiratory Failure.  Patient currently is resting comfortably without distress has been on 4 L nasal cannula.  Spoke to the patient as well as the daughter present in the room and they are going to go for a hybrid hospice care at home  Medications: Reviewed on Rounds  Physical Exam:  Vitals: Temperature is 97.5 pulse 96 respiratory rate 30 blood pressure is 156/87 saturations 95%  Ventilator Settings on 3 to 4 L nasal cannula now  . General: Comfortable at this time . Eyes: Grossly normal lids, irises & conjunctiva . ENT: grossly tongue is normal . Neck: no obvious mass . Cardiovascular: S1 S2 normal no gallop . Respiratory: Scattered coarse breath sounds . Abdomen: soft . Skin: no rash seen on limited exam . Musculoskeletal: not rigid . Psychiatric:unable to assess . Neurologic: no seizure no involuntary movements         Lab Data:   Basic Metabolic Panel: Recent Labs  Lab 11/10/20 0422 11/13/20 0340  NA 139 140  K 4.4 4.5  CL 101 103  CO2 30 30  GLUCOSE 190* 165*  BUN 26* 30*  CREATININE 0.86 0.55  CALCIUM 9.7 9.9  MG  --  2.0    ABG: Recent Labs  Lab 11/10/20 1415  PHART 7.464*  PCO2ART 38.1  PO2ART 71.2*  HCO3 27.4  O2SAT 96.5    Liver Function Tests: No results for input(s): AST, ALT, ALKPHOS, BILITOT, PROT, ALBUMIN in the last 168 hours. No results for input(s): LIPASE, AMYLASE in the last 168 hours. No results for input(s): AMMONIA in the last 168 hours.  CBC: Recent Labs  Lab 11/10/20 0422  WBC 13.4*  HGB 14.3  HCT 43.3  MCV 91.2  PLT PLATELET CLUMPS NOTED ON SMEAR, UNABLE TO ESTIMATE    Cardiac Enzymes: No results for input(s):  CKTOTAL, CKMB, CKMBINDEX, TROPONINI in the last 168 hours.  BNP (last 3 results) Recent Labs    09/11/20 1339 10/21/20 1206  BNP 69.6 80.7    ProBNP (last 3 results) No results for input(s): PROBNP in the last 8760 hours.  Radiological Exams: No results found.  Assessment/Plan Active Problems:   Acute on chronic respiratory failure with hypoxia (HCC)   COVID-19 virus infection   Severe sepsis (HCC)   Chronic heart failure with preserved ejection fraction ([place])   1. Acute on chronic respiratory failure with hypoxia the plan is going to be to continue with the oxygen therapy and titrated as tolerated. 2. COVID-19 virus infection recovery phase we will continue to monitor. 3. Severe sepsis treated slow improvement 4. Chronic heart failure preserved ejection fraction we will continue with present management   I have personally seen and evaluated the patient, evaluated laboratory and imaging results, formulated the assessment and plan and placed orders. The Patient requires high complexity decision making with multiple systems involvement.  Rounds were done with the Respiratory Therapy Director and Staff therapists and discussed with nursing staff also.  Allyne Gee, MD [name] Pulmonary Critical Care Medicine Sleep Medicine

## 2020-11-17 NOTE — Progress Notes (Signed)
Pulmonary Critical Care Medicine [place]   PULMONARY CRITICAL CARE SERVICE  PROGRESS NOTE     [name]  [id number]  DOB: 1950-08-07   DOA: 09/23/2020  Referring Physician: Merton Border, MD  HPI: [name] is a 70 y.o. female seen for follow up of Acute on Chronic Respiratory Failure.  Patient is currently on 4 L oxygen good saturations are noted.  Medications: Reviewed on Rounds  Physical Exam:  Vitals: Temperature is 96.8 pulse 99 respiratory rate is 30 blood pressure is 150/82 saturations 96%  Ventilator Settings on 4 L of oxygen  . General: Comfortable at this time . Eyes: Grossly normal lids, irises & conjunctiva . ENT: grossly tongue is normal . Neck: no obvious mass . Cardiovascular: S1 S2 normal no gallop . Respiratory: Scattered rhonchi expansion is equal . Abdomen: soft . Skin: no rash seen on limited exam . Musculoskeletal: not rigid . Psychiatric:unable to assess . Neurologic: no seizure no involuntary movements         Lab Data:   Basic Metabolic Panel: Recent Labs  Lab 11/13/20 0340  NA 140  K 4.5  CL 103  CO2 30  GLUCOSE 165*  BUN 30*  CREATININE 0.55  CALCIUM 9.9  MG 2.0    ABG: Recent Labs  Lab 11/10/20 1415  PHART 7.464*  PCO2ART 38.1  PO2ART 71.2*  HCO3 27.4  O2SAT 96.5    Liver Function Tests: No results for input(s): AST, ALT, ALKPHOS, BILITOT, PROT, ALBUMIN in the last 168 hours. No results for input(s): LIPASE, AMYLASE in the last 168 hours. No results for input(s): AMMONIA in the last 168 hours.  CBC: No results for input(s): WBC, NEUTROABS, HGB, HCT, MCV, PLT in the last 168 hours.  Cardiac Enzymes: No results for input(s): CKTOTAL, CKMB, CKMBINDEX, TROPONINI in the last 168 hours.  BNP (last 3 results) Recent Labs    09/11/20 1339 10/21/20 1206  BNP 69.6 80.7    ProBNP (last 3 results) No results for input(s): PROBNP in the last 8760 hours.  Radiological Exams: No  results found.  Assessment/Plan Active Problems:   Acute on chronic respiratory failure with hypoxia (HCC)   COVID-19 virus infection   Severe sepsis (HCC)   Chronic heart failure with preserved ejection fraction ([place])   1. Acute on chronic respiratory failure with hypoxia we will continue with the BiPAP at nighttime patient is now on 4 L of O2.  As discussed yesterday working on discharge with hospice at home 2. COVID-19 virus infection in recovery 3. Severe sepsis treated 4. Chronic heart failure preserved ejection fraction no change we will continue to monitor closely   I have personally seen and evaluated the patient, evaluated laboratory and imaging results, formulated the assessment and plan and placed orders. The Patient requires high complexity decision making with multiple systems involvement.  Rounds were done with the Respiratory Therapy Director and Staff therapists and discussed with nursing staff also.  Allyne Gee, MD [name] Pulmonary Critical Care Medicine Sleep Medicine

## 2020-12-02 DEATH — deceased

## 2020-12-14 ENCOUNTER — Telehealth: Payer: Self-pay | Admitting: Internal Medicine

## 2020-12-14 NOTE — Telephone Encounter (Signed)
Called and spoke with Marcie Bal, patient's step-daughter who states that patient passed away on 12-26-2020, the cause of death on the death certificate was put as COPD and smoker for 10 years. Marcie Bal is stating this is incorrect and was wondering if there was anything our provider could do to change cause. Marcie Bal states the pulmonology (per chart Dr Lake Bells) had stated when they saw patient in hospital that it was pulmonary fibrosis. Death certificate was filled out by [facility] doctor per Marcie Bal Dr Manuella Ghazi. Advised that she will need to follow up/check with provider who filled out death certificate. Marcie Bal expressed full understanding. Nothing further needed at this time.

## 2021-11-19 IMAGING — DX DG CHEST 1V PORT
1 series · 1 of 1 positions shown · non-contrast
Comparison: 09/11/2020

CLINICAL DATA: Post COVID

EXAM:
PORTABLE CHEST 1 VIEW

[chest ap]
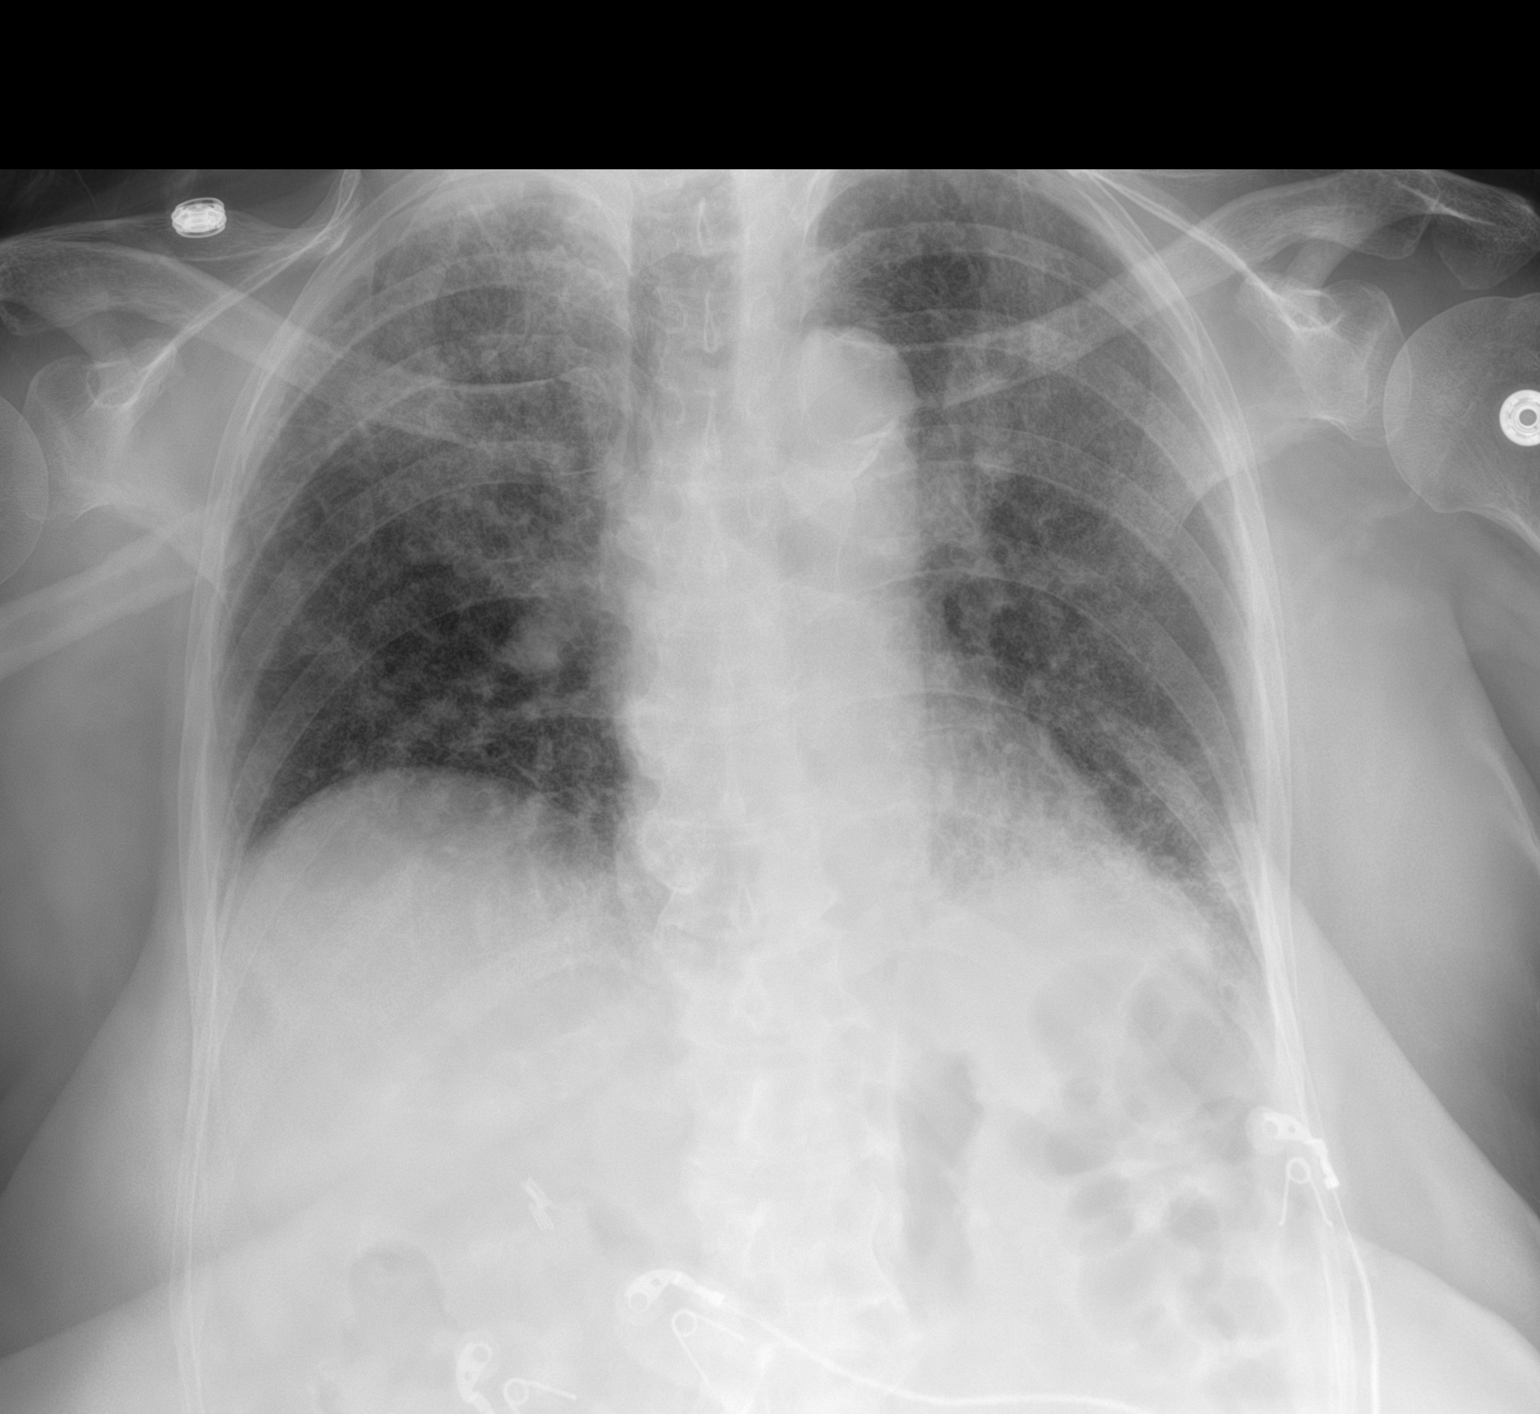

[1 of 1 positions shown; findings below may reference images not displayed]

FINDINGS: Mild diffuse bilateral airspace disease unchanged. This is most
prominent in the left base. No pleural effusion. Heart size and
vascularity normal. Atherosclerotic calcification aortic arch. No
acute skeletal abnormality.
IMPRESSION: Persistent diffuse bilateral airspace disease compatible with
changes from COVID pneumonia.
